# Patient Record
Sex: Female | Born: 1950 | Race: Black or African American | Hispanic: No | Marital: Married | Smoking: Former smoker
Health system: Southern US, Community
[De-identification: ages and names within clinical notes are randomized; demographics above are authoritative.]

## PROBLEM LIST (undated history)

## (undated) DIAGNOSIS — I5042 Chronic combined systolic (congestive) and diastolic (congestive) heart failure: Principal | ICD-10-CM

## (undated) DIAGNOSIS — I10 Essential (primary) hypertension: Secondary | ICD-10-CM

## (undated) DIAGNOSIS — I517 Cardiomegaly: Secondary | ICD-10-CM

## (undated) DIAGNOSIS — M1990 Unspecified osteoarthritis, unspecified site: Secondary | ICD-10-CM

## (undated) DIAGNOSIS — H269 Unspecified cataract: Secondary | ICD-10-CM

## (undated) DIAGNOSIS — N189 Chronic kidney disease, unspecified: Secondary | ICD-10-CM

## (undated) DIAGNOSIS — D649 Anemia, unspecified: Secondary | ICD-10-CM

## (undated) DIAGNOSIS — E785 Hyperlipidemia, unspecified: Secondary | ICD-10-CM

## (undated) DIAGNOSIS — H409 Unspecified glaucoma: Secondary | ICD-10-CM

## (undated) DIAGNOSIS — R600 Localized edema: Secondary | ICD-10-CM

## (undated) HISTORY — DX: Localized edema: R60.0

## (undated) HISTORY — DX: Cardiomegaly: I51.7

## (undated) HISTORY — DX: Chronic combined systolic (congestive) and diastolic (congestive) heart failure: I50.42

## (undated) HISTORY — DX: Essential (primary) hypertension: I10

## (undated) HISTORY — DX: Unspecified glaucoma: H40.9

## (undated) HISTORY — PX: CHOLECYSTECTOMY: SHX55

## (undated) HISTORY — DX: Unspecified cataract: H26.9

## (undated) HISTORY — DX: Anemia, unspecified: D64.9

## (undated) HISTORY — DX: Hyperlipidemia, unspecified: E78.5

---

## 1997-09-29 ENCOUNTER — Encounter: Admission: RE | Admit: 1997-09-29 | Discharge: 1997-12-28 | Payer: Self-pay | Admitting: Internal Medicine

## 1998-06-27 ENCOUNTER — Encounter: Payer: Self-pay | Admitting: Emergency Medicine

## 1998-06-27 ENCOUNTER — Emergency Department (HOSPITAL_COMMUNITY): Admission: EM | Admit: 1998-06-27 | Discharge: 1998-06-27 | Payer: Self-pay | Admitting: Emergency Medicine

## 1998-08-04 ENCOUNTER — Encounter: Admission: RE | Admit: 1998-08-04 | Discharge: 1998-08-29 | Payer: Self-pay | Admitting: Specialist

## 2002-10-26 ENCOUNTER — Encounter: Payer: Self-pay | Admitting: Family Medicine

## 2002-10-26 ENCOUNTER — Ambulatory Visit (HOSPITAL_COMMUNITY): Admission: RE | Admit: 2002-10-26 | Discharge: 2002-10-26 | Payer: Self-pay | Admitting: Family Medicine

## 2008-04-13 ENCOUNTER — Ambulatory Visit: Payer: Self-pay | Admitting: Cardiology

## 2010-04-08 ENCOUNTER — Encounter: Payer: Self-pay | Admitting: Family Medicine

## 2010-04-09 ENCOUNTER — Encounter: Payer: Self-pay | Admitting: Family Medicine

## 2010-08-01 NOTE — Assessment & Plan Note (Signed)
[facility]                            CARDIOLOGY OFFICE NOTE   NAME:[name], [name]                     MRN:          [id number]  DATE:04/13/2008                            DOB:          1950/07/30    PRIMARY CARE PHYSICIAN:  Meredith Staggers at [facility].   The patient is also seen by Jeralyn Ruths who is a Publishing rights manager  there at [facility].   HISTORY OF PRESENT ILLNESS:  This is a 60 year old with history of  diabetes, hypertension, and hyperlipidemia who presents to Cardiology  Clinic for evaluation of her coronary artery disease risk factors and  her poorly controlled hypertension.  The patient had actually gone about  3-4 years without seeing a doctor.  She had an appointment at [facility] back in December 2009.  She was found to have quite high  blood pressure with her systolics running above 200.  She was also noted  to have a hemoglobin A1c of 14.2.  She was started on Zocor, aspirin,  lisinopril/hydrochlorothiazide as well as metformin and glipizide at  [facility] and she was referred here for evaluation of her  cardiac risk factors and for help with control of her blood pressure.  The patient's blood pressure today is 193/111.  She says she has been  taking all her medicines.  She actually is fairly asymptomatic.  She has  never had any episodes of chest pain or discomfort.  She does not get  dyspnea on exertion.  She says she walks every where she needs to go  without being short of breath.  She can climb 1 or 2 flights of stairs  without any difficulty.  She has no orthopnea, no PND.  She denies any  headaches or visual changes and she has had no stroke-like symptoms.  She says she has a treadmill that she had been using in the past for  exercise; however, she has not been using it lately.   PAST MEDICAL HISTORY:  1. Type 2 diabetes.  Her hemoglobin A1c was 14.2 when she came for      initial  medical care back in December 2009.  She is now on      metformin and glipizide.  2. Hypertension.  The patient says that she has had high blood      pressure for several years; however, she was not on any medicine      prior to December 2009.  3. Hyperlipidemia.   SOCIAL HISTORY:  The patient is married.  She has a daughter.  She  drives a school bus.  She does not smoke or drink alcohol.   FAMILY HISTORY:  There is no family history of early coronary artery  disease.   REVIEW OF SYSTEMS:  Negative except as noted in the history present  illness.  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  Review of systems is negative  except as noted in the history present illness.   CURRENT MEDICATIONS:  1. Metformin 500 mg b.i.d.  2. Glipizide-XL 10 mg daily.  3. Zocor 40 mg daily.  4. Aspirin 81 mg daily.  5. Lisinopril/hydrochlorothiazide 20/12.5 daily.   LABORATORY DATA:  Sodium 139, creatinine 0.98, hemoglobin A1c in  December was 14.2, LDL 131, triglycerides 88, HDL 52.   PHYSICAL EXAMINATION:  VITAL SIGNS:  Blood pressure is 193/111, heart  rate is 100 beats a minute.  GENERAL:  This is a well-developed female in no apparent distress.  NEUROLOGIC:  Alert and oriented x3.  Normal affect.  LUNGS:  Clear to auscultation bilaterally with normal respiratory  effort.  CARDIOVASCULAR:  Heart regular.  S1 and S2.  There is an S4.  There is  no S3.  There is no murmur.  There is no peripheral edema.  There are 2+  posterior tibial pulses bilaterally.  There is no carotid bruit.  There  is no peripheral edema.  NECK:  There is no JVD.  There is no thyromegaly or thyroid nodule.  HEENT:  Normal exam.  ABDOMEN:  Soft, nontender.  No hepatosplenomegaly.  Normal bowel sounds.  LUNGS:  Clear to auscultation bilaterally with normal respiratory  effort.  MUSCULOSKELETAL:  Normal exam.  EXTREMITIES:  No clubbing or cyanosis.  SKIN:   Normal exam.   ASSESSMENT/PLAN:  This is a 60 year old with multiple cardiac risk  factors including diabetes, hypertension, and hyperlipidemia here for  evaluation of her coronary disease risk.  1. Coronary artery disease risk.  The patient does have a number of      risk factors for coronary disease.  She does have poorly controlled      diabetes and hypertension.  She also has high cholesterol.  She      actually has no ischemic symptoms at all.  She has never had      episodes of chest pain and actually she does seem to have good      exercise tolerance.  I think at this time the best course will be      to aggressively control her risk factors and to get them under much      better control.  She needs to be on aspirin 81 mg daily.  We need      to titrate her statin to a goal LDL of less than 70 and we need to      get her blood pressure down to below 130/80.  2. Hypertension.  The patient's blood pressure is way too high.  I did      tell her to go ahead and take an extra dose of      lisinopril/hydrochlorothiazide today and we will increase her      lisinopril/hydrochlorothiazide to 40/25.  We will additionally add      amlodipine 5 mg daily.  I will bring her back in 2 weeks for a Chem-      7 and blood pressure check to see how we are doing.  If needed, we      will titrate up amlodipine at that point.  We will additionally get      an echocardiogram to assess for her LV function and for LVH in the      setting of her hypertension.  3. Diabetes.  The patient needs better control of this.  She is      working on this through her primary care doctor.  4. Hyperlipidemia.  The patient is on Zocor 40 mg a day.  She was just  started on this back in December, our goal for her LDL less than      70.  5. We did talk at length about diet and exercise.  I told her she      needs to avoid sodium.  She needs to follow a Mediterranean-type      diet and I did encourage her to get a  treadmill back out and walk      for about half an hour a day 4-5 times a week on her treadmill.   I will see her back in followup in a month to assess her blood pressure  control and to go over her study.     Marca Ancona, MD  Electronically Signed    DM/MedQ  DD: 04/13/2008  DT: 04/14/2008  Job #: [id number]   cc:   Shade Flood, M.D.  Jeralyn Ruths

## 2011-03-27 ENCOUNTER — Ambulatory Visit (INDEPENDENT_AMBULATORY_CARE_PROVIDER_SITE_OTHER): Payer: 59 | Admitting: Physician Assistant

## 2011-03-27 DIAGNOSIS — D649 Anemia, unspecified: Secondary | ICD-10-CM

## 2011-03-27 DIAGNOSIS — IMO0001 Reserved for inherently not codable concepts without codable children: Secondary | ICD-10-CM

## 2011-03-27 DIAGNOSIS — I10 Essential (primary) hypertension: Secondary | ICD-10-CM

## 2011-04-15 ENCOUNTER — Ambulatory Visit (INDEPENDENT_AMBULATORY_CARE_PROVIDER_SITE_OTHER): Payer: 59

## 2011-04-15 DIAGNOSIS — R112 Nausea with vomiting, unspecified: Secondary | ICD-10-CM

## 2011-04-15 DIAGNOSIS — R55 Syncope and collapse: Secondary | ICD-10-CM

## 2011-04-15 DIAGNOSIS — E119 Type 2 diabetes mellitus without complications: Secondary | ICD-10-CM

## 2011-04-18 ENCOUNTER — Emergency Department (HOSPITAL_COMMUNITY): Payer: 59

## 2011-04-18 ENCOUNTER — Other Ambulatory Visit: Payer: Self-pay

## 2011-04-18 ENCOUNTER — Inpatient Hospital Stay (HOSPITAL_COMMUNITY)
Admission: EM | Admit: 2011-04-18 | Discharge: 2011-04-20 | DRG: 392 | Disposition: A | Discharge status: Home / Self Care | Payer: 59 | Attending: Internal Medicine | Admitting: Internal Medicine

## 2011-04-18 ENCOUNTER — Encounter (HOSPITAL_COMMUNITY): Payer: Self-pay

## 2011-04-18 ENCOUNTER — Ambulatory Visit (INDEPENDENT_AMBULATORY_CARE_PROVIDER_SITE_OTHER): Payer: 59 | Admitting: Family Medicine

## 2011-04-18 VITALS — BP 114/77 | HR 105 | Temp 98.4°F | Resp 20 | Ht 61.5 in | Wt 116.0 lb

## 2011-04-18 DIAGNOSIS — E119 Type 2 diabetes mellitus without complications: Secondary | ICD-10-CM

## 2011-04-18 DIAGNOSIS — I4510 Unspecified right bundle-branch block: Secondary | ICD-10-CM | POA: Insufficient documentation

## 2011-04-18 DIAGNOSIS — E860 Dehydration: Secondary | ICD-10-CM

## 2011-04-18 DIAGNOSIS — N289 Disorder of kidney and ureter, unspecified: Secondary | ICD-10-CM

## 2011-04-18 DIAGNOSIS — R531 Weakness: Secondary | ICD-10-CM

## 2011-04-18 DIAGNOSIS — I517 Cardiomegaly: Secondary | ICD-10-CM | POA: Insufficient documentation

## 2011-04-18 DIAGNOSIS — I447 Left bundle-branch block, unspecified: Secondary | ICD-10-CM

## 2011-04-18 DIAGNOSIS — D649 Anemia, unspecified: Secondary | ICD-10-CM | POA: Insufficient documentation

## 2011-04-18 DIAGNOSIS — I10 Essential (primary) hypertension: Secondary | ICD-10-CM

## 2011-04-18 DIAGNOSIS — N179 Acute kidney failure, unspecified: Secondary | ICD-10-CM

## 2011-04-18 DIAGNOSIS — E785 Hyperlipidemia, unspecified: Secondary | ICD-10-CM

## 2011-04-18 DIAGNOSIS — R5381 Other malaise: Secondary | ICD-10-CM | POA: Diagnosis present

## 2011-04-18 DIAGNOSIS — A088 Other specified intestinal infections: Principal | ICD-10-CM | POA: Diagnosis present

## 2011-04-18 DIAGNOSIS — R5383 Other fatigue: Secondary | ICD-10-CM | POA: Diagnosis present

## 2011-04-18 LAB — POCT CBC
Granulocyte percent: 43.2 %G (ref 37–80)
HCT, POC: 42.4 % (ref 37.7–47.9)
Hemoglobin: 13.8 g/dL (ref 12.2–16.2)
Lymph, poc: 2.6 (ref 0.6–3.4)
MCH, POC: 27.5 pg (ref 27–31.2)
MCHC: 32.5 g/dL (ref 31.8–35.4)
MCV: 84.4 fL (ref 80–97)
MID (cbc): 0.5 (ref 0–0.9)
MPV: 9 fL (ref 0–99.8)
POC Granulocyte: 2.3 (ref 2–6.9)
POC LYMPH PERCENT: 47.7 %L (ref 10–50)
POC MID %: 9.1 %M (ref 0–12)
Platelet Count, POC: 342 10*3/uL (ref 142–424)
RBC: 5.02 M/uL (ref 4.04–5.48)
RDW, POC: 13.9 %
WBC: 5.4

## 2011-04-18 LAB — DIFFERENTIAL
Basophils Absolute: 0 10*3/uL (ref 0.0–0.1)
Basophils Relative: 0 % (ref 0–1)
Eosinophils Relative: 1 % (ref 0–5)
Lymphocytes Relative: 46 % (ref 12–46)
Neutro Abs: 2.4 10*3/uL (ref 1.7–7.7)

## 2011-04-18 LAB — URINALYSIS, ROUTINE W REFLEX MICROSCOPIC
Bilirubin Urine: NEGATIVE
Glucose, UA: NEGATIVE mg/dL
Hgb urine dipstick: NEGATIVE
Nitrite: NEGATIVE
Specific Gravity, Urine: 1.015 (ref 1.005–1.030)
pH: 5 (ref 5.0–8.0)

## 2011-04-18 LAB — CBC
MCHC: 35.5 g/dL (ref 30.0–36.0)
Platelets: 295 10*3/uL (ref 150–400)
RDW: 12.4 % (ref 11.5–15.5)
WBC: 5.8 10*3/uL (ref 4.0–10.5)

## 2011-04-18 LAB — URINE MICROSCOPIC-ADD ON

## 2011-04-18 LAB — GLUCOSE, CAPILLARY: Glucose-Capillary: 93 mg/dL (ref 70–99)

## 2011-04-18 LAB — HEPATIC FUNCTION PANEL
ALT: 11 U/L (ref 0–35)
Total Protein: 6.6 g/dL (ref 6.0–8.3)

## 2011-04-18 LAB — BASIC METABOLIC PANEL
BUN: 46 mg/dL — ABNORMAL HIGH (ref 6–23)
Chloride: 91 mEq/L — ABNORMAL LOW (ref 96–112)
GFR calc Af Amer: 23 mL/min — ABNORMAL LOW (ref >90)
Potassium: 3.8 mEq/L (ref 3.5–5.1)

## 2011-04-18 MED ORDER — SODIUM CHLORIDE 0.9 % IV BOLUS (SEPSIS)
500.0000 mL | Freq: Once | INTRAVENOUS | Status: AC
  Administered 2011-04-18: 500 mL via INTRAVENOUS

## 2011-04-18 MED ORDER — ONDANSETRON HCL 4 MG PO TABS: 4.0000 mg | ORAL_TABLET | Freq: Four times a day (QID) | ORAL | Status: DC | PRN

## 2011-04-18 MED ORDER — ALUM & MAG HYDROXIDE-SIMETH 200-200-20 MG/5ML PO SUSP: 30.0000 mL | Freq: Four times a day (QID) | ORAL | Status: DC | PRN

## 2011-04-18 MED ORDER — ENOXAPARIN SODIUM 40 MG/0.4ML ~~LOC~~ SOLN: 40.0000 mg | SUBCUTANEOUS | Status: DC

## 2011-04-18 MED ORDER — INSULIN ASPART 100 UNIT/ML ~~LOC~~ SOLN: 0.0000 [IU] | Freq: Three times a day (TID) | SUBCUTANEOUS | Status: DC

## 2011-04-18 MED ORDER — SODIUM CHLORIDE 0.9 % IV SOLN
Freq: Once | INTRAVENOUS | Status: AC
  Administered 2011-04-18: 20:00:00 via INTRAVENOUS

## 2011-04-18 MED ORDER — GLIMEPIRIDE 4 MG PO TABS
4.0000 mg | ORAL_TABLET | Freq: Two times a day (BID) | ORAL | Status: DC
  Administered 2011-04-18 – 2011-04-20 (×4): 4 mg via ORAL
  Filled 2011-04-18 (×5): qty 1

## 2011-04-18 MED ORDER — CLONIDINE HCL 0.1 MG PO TABS
0.1000 mg | ORAL_TABLET | Freq: Two times a day (BID) | ORAL | Status: DC
  Administered 2011-04-18 – 2011-04-20 (×4): 0.1 mg via ORAL
  Filled 2011-04-18 (×5): qty 1

## 2011-04-18 MED ORDER — SODIUM CHLORIDE 0.9 % IV SOLN
INTRAVENOUS | Status: DC
  Administered 2011-04-19: 100 mL/h via INTRAVENOUS

## 2011-04-18 MED ORDER — ONDANSETRON HCL 4 MG/2ML IJ SOLN: 4.0000 mg | Freq: Four times a day (QID) | INTRAMUSCULAR | Status: DC | PRN

## 2011-04-18 MED ORDER — AMLODIPINE BESYLATE 10 MG PO TABS
10.0000 mg | ORAL_TABLET | Freq: Every day | ORAL | Status: DC
  Administered 2011-04-18 – 2011-04-20 (×3): 10 mg via ORAL
  Filled 2011-04-18 (×3): qty 1

## 2011-04-18 MED ORDER — INSULIN ASPART 100 UNIT/ML ~~LOC~~ SOLN: 0.0000 [IU] | Freq: Every day | SUBCUTANEOUS | Status: DC

## 2011-04-18 MED ORDER — ENOXAPARIN SODIUM 30 MG/0.3ML ~~LOC~~ SOLN
30.0000 mg | Freq: Every day | SUBCUTANEOUS | Status: DC
  Administered 2011-04-18 – 2011-04-19 (×2): 30 mg via SUBCUTANEOUS
  Filled 2011-04-18 (×3): qty 0.3

## 2011-04-18 NOTE — H&P (Addendum)
PCP:   Corey Harold, MD, MD   Chief Complaint:  Weakness  HPI: This is a 61 year old female who's had been having nausea, vomiting and diarrhea since last Tuesday. She's had poor by mouth intake as result. She reports she's had no fevers, no chills, no abdominal pain. She reports no lightheadedness. She has however become increasingly weaker. She has not been to see her PCP within the interim. Her diarrhea has resolved since yesterday. Her nausea had improved but today became worse. She has not able to walk without assistance since yesterday, the family brought her to the er. Last Friday she took a laxative, the next morning she was weak and syncopized. She reports no associated chest pains or palpitations. Today the patient went to the urgent care clinic her creatinine was elevated. They transferred her to [facility], she brought medical records it shows a recent creatinine of 1.07. History provided by the patient is with both family members are at bedside  Review of Systems: Positives bolded anorexia, fever, weight loss,, vision loss, decreased hearing, hoarseness, chest pain, syncope, dyspnea on exertion, peripheral edema, balance deficits, hemoptysis, abdominal pain, melena, hematochezia, severe indigestion/heartburn, hematuria, incontinence, genital sores, muscle weakness, suspicious skin lesions, transient blindness, difficulty walking, depression, unusual weight change, abnormal bleeding, enlarged lymph nodes, angioedema, and breast masses.  Past Medical History: Past Medical History  Diagnosis Date  . Hypertension   . Diabetes mellitus   . Anemia   . Hyperlipidemia   . Left ventricular hypertrophy    Past Surgical History  Procedure Date  . Cholecystectomy     Medications: Prior to Admission medications   Medication Sig Start Date End Date Taking? Authorizing Provider  amLODipine (NORVASC) 10 MG tablet Take 10 mg by mouth daily.   Yes Historical Provider, MD  cloNIDine (CATAPRES)  0.1 MG tablet Take 0.1 mg by mouth 2 (two) times daily.   Yes Historical Provider, MD  glimepiride (AMARYL) 4 MG tablet Take 4 mg by mouth 2 (two) times daily.   Yes Historical Provider, MD  metFORMIN (GLUCOPHAGE) 1000 MG tablet Take 1,000 mg by mouth 2 (two) times daily with a meal.   Yes Historical Provider, MD  pravastatin (PRAVACHOL) 40 MG tablet Take 40 mg by mouth at bedtime.   Yes Historical Provider, MD    Allergies:   Allergies  Allergen Reactions  . Macrobid Nausea Only    Social History:  reports that she quit smoking about 45 years ago. Her smoking use included Cigarettes. She does not have any smokeless tobacco history on file. She reports that she does not drink alcohol or use illicit drugs.  Family History: Family History  Problem Relation Age of Onset  . Diabetes type II    . Hypertension      Physical Exam: Filed Vitals:   04/18/11 1626 04/18/11 1924 04/18/11 1935  BP: 153/97 154/64 152/78  Pulse: 88 95 91  Temp: 98.4 F (36.9 C) 99.4 F (37.4 C)   TempSrc: Oral Oral   Resp: 16 18 16   Height: 5\' 1"  (1.549 m)    Weight: 52.617 kg (116 lb)    SpO2: 99% 99% 99%    General:  Alert and oriented times three, slim but weak female, no acute distress Eyes: PERRLA, pink conjunctiva, no scleral icterus ENT: dry oral mucosa, neck supple, no thyromegaly Lungs: clear to ascultation, no wheeze, no crackles, no use of accessory muscles Cardiovascular: regular rate and rhythm, no regurgitation, no gallops, no murmurs. No carotid bruits, no JVD  Abdomen: soft, positive BS, non-tender, non-distended, no organomegaly, not an acute abdomen GU: not examined Neuro: CN II - XII grossly intact, sensation intact Musculoskeletal: strength 5/5 all extremities, no clubbing, cyanosis or edema Skin: no rash, no subcutaneous crepitation, no decubitus Psych: appropriate patient   Labs on Admission:   Basename 04/18/11 1800  NA 130*  K 3.8  CL 91*  CO2 26  GLUCOSE 119*  BUN  46*  CREATININE 2.46*  CALCIUM 9.4  MG --  PHOS --    Basename 04/18/11 1825  AST 15  ALT 11  ALKPHOS 70  BILITOT 0.3  PROT 6.6  ALBUMIN 3.1*   No results found for this basename: LIPASE:2,AMYLASE:2 in the last 72 hours  Basename 04/18/11 1800 04/18/11 1550  WBC 5.8 5.4  NEUTROABS 2.4 --  HGB 12.4 13.8  HCT 34.9* 42.4  MCV 79.0 84.4  PLT 295 --   No results found for this basename: CKTOTAL:3,CKMB:3,CKMBINDEX:3,TROPONINI:3 in the last 72 hours No components found with this basename: POCBNP:3 No results found for this basename: DDIMER:2 in the last 72 hours No results found for this basename: HGBA1C:2 in the last 72 hours No results found for this basename: CHOL:2,HDL:2,LDLCALC:2,TRIG:2,CHOLHDL:2,LDLDIRECT:2 in the last 72 hours No results found for this basename: TSH,T4TOTAL,FREET3,T3FREE,THYROIDAB in the last 72 hours No results found for this basename: VITAMINB12:2,FOLATE:2,FERRITIN:2,TIBC:2,IRON:2,RETICCTPCT:2 in the last 72 hours  Micro Results: No results found for this or any previous visit (from the past 240 hour(s)).   Radiological Exams on Admission: No results found.  Assessment/Plan Present on Admission:  .Acute kidney injury/dehydration  .Weakness generalized Gastroenteritis Admit to MedSurg Liquid diet advance as tolerated to 80 diagonal IV fluid hydration No overt evidence of infection therefore no antibiotics started  .Anemia .DM (diabetes mellitus) .HTN (hypertension) .Hyperlipidemia Stable, resume home medication Syncope Likely Due to orthostatic changes  Full code DVT prophylaxis Team 2/Dr. Sammuel Cooper, [place] Weatherwax 04/18/2011, 9:06 PM

## 2011-04-18 NOTE — ED Provider Notes (Signed)
History     CSN: [id number]  Arrival date & time 04/18/11  1619   First MD Initiated Contact with Patient 04/18/11 1716      Chief Complaint  Patient presents with  . Nausea  . Emesis  . Diarrhea    (Consider location/radiation/quality/duration/timing/severity/associated sxs/prior treatment) HPI Comments: Pt with history of diabetes, hypertension, and hyperlipidemia who presents to the ED from Urgent care for dehydration and ARF. Pt was sent with lab results from [facility] Jan 14th crt 1.07 & BUN of 18; Jan 28th crt 1.84 & BUN 46.  Patient has had symptoms of nausea, vomiting, diarrhea times one week.  She denies any hematemesis or hematochezia.  Patient reports that she did not have any abdominal pain.  Patient states she has felt febrile with night sweats and chills in that she has entire body fatigue but denies extremity weakness.  Patient denies having a cough, sore throat, congestion, chest pain, shortness of breath.     Patient is a 61 y.o. female presenting with vomiting and diarrhea. The history is provided by the patient.  Emesis  Associated symptoms include diarrhea. Pertinent negatives include no abdominal pain, no arthralgias, no chills, no cough, no fever, no headaches and no myalgias.  Diarrhea The primary symptoms include nausea, vomiting and diarrhea. Primary symptoms do not include fever, abdominal pain, dysuria, myalgias, arthralgias or rash.  The illness does not include chills or constipation.    Past Medical History  Diagnosis Date  . Hypertension   . Diabetes mellitus   . Anemia   . Hyperlipidemia   . Left ventricular hypertrophy     Past Surgical History  Procedure Date  . Cholecystectomy     History reviewed. No pertinent family history.  History  Substance Use Topics  . Smoking status: Former Smoker    Types: Cigarettes    Quit date: 03/19/1966  . Smokeless tobacco: Not on file  . Alcohol Use: Not on file    OB History    Grav Para  Term Preterm Abortions TAB SAB Ect Mult Living                  Review of Systems  Constitutional: Positive for appetite change. Negative for fever and chills.  HENT: Negative for neck stiffness and dental problem.   Eyes: Negative for visual disturbance.  Respiratory: Negative for cough, chest tightness, shortness of breath and wheezing.   Cardiovascular: Negative for chest pain.  Gastrointestinal: Positive for nausea, vomiting and diarrhea. Negative for abdominal pain, constipation, blood in stool, abdominal distention, anal bleeding and rectal pain.  Genitourinary: Negative for dysuria, urgency, hematuria and flank pain.  Musculoskeletal: Negative for myalgias and arthralgias.  Skin: Negative for rash.  Neurological: Negative for dizziness, syncope, speech difficulty, numbness and headaches.  Hematological: Does not bruise/bleed easily.  All other systems reviewed and are negative.    Allergies  Macrobid  Home Medications   Current Outpatient Rx  Name Route Sig Dispense Refill  . AMLODIPINE BESYLATE 10 MG PO TABS Oral Take 10 mg by mouth daily.    Marland Kitchen CLONIDINE HCL 0.1 MG PO TABS Oral Take 0.1 mg by mouth 2 (two) times daily.    Marland Kitchen GLIMEPIRIDE 4 MG PO TABS Oral Take 4 mg by mouth 2 (two) times daily.    Marland Kitchen METFORMIN HCL 1000 MG PO TABS Oral Take 1,000 mg by mouth 2 (two) times daily with a meal.    . PRAVASTATIN SODIUM 40 MG PO TABS Oral Take  40 mg by mouth at bedtime.      BP 152/78  Pulse 91  Temp(Src) 99.4 F (37.4 C) (Oral)  Resp 16  Ht 5\' 1"  (1.549 m)  Wt 116 lb (52.617 kg)  BMI 21.92 kg/m2  SpO2 99%  Physical Exam  Nursing note and vitals reviewed. Constitutional: Vital signs are normal. She appears well-developed and well-nourished. No distress.  HENT:  Head: Normocephalic and atraumatic.  Mouth/Throat: Uvula is midline, oropharynx is clear and moist and mucous membranes are normal.  Eyes: Conjunctivae and EOM are normal. Pupils are equal, round, and reactive  to light.  Neck: Normal range of motion and full passive range of motion without pain. Neck supple. No spinous process tenderness and no muscular tenderness present. No rigidity. No Brudzinski's sign noted.  Cardiovascular: Regular rhythm and intact distal pulses.        Tachycardic HR 108, weak pedal pulses  Pulmonary/Chest: Effort normal and breath sounds normal. No accessory muscle usage. Not tachypneic. No respiratory distress.  Abdominal: Soft. Normal appearance and bowel sounds are normal. She exhibits no distension, no ascites, no pulsatile midline mass and no mass. There is no tenderness. There is no CVA tenderness. No hernia.       No abdominal tenderness  Lymphadenopathy:    She has no cervical adenopathy.  Neurological: She is alert.  Skin: Skin is warm and dry. No rash noted. She is not diaphoretic.  Psychiatric: She has a normal mood and affect. Her speech is normal and behavior is normal.    ED Course  Procedures (including critical care time)  Labs Reviewed  GLUCOSE, CAPILLARY - Abnormal; Notable for the following:    Glucose-Capillary 112 (*)    All other components within normal limits  URINALYSIS, ROUTINE W REFLEX MICROSCOPIC - Abnormal; Notable for the following:    Ketones, ur TRACE (*)    Protein, ur 30 (*)    All other components within normal limits  BASIC METABOLIC PANEL - Abnormal; Notable for the following:    Sodium 130 (*)    Chloride 91 (*)    Glucose, Bld 119 (*)    BUN 46 (*)    Creatinine, Ser 2.46 (*)    GFR calc non Af Amer 20 (*)    GFR calc Af Amer 23 (*)    All other components within normal limits  CBC - Abnormal; Notable for the following:    HCT 34.9 (*)    All other components within normal limits  DIFFERENTIAL - Abnormal; Notable for the following:    Neutrophils Relative 42 (*)    All other components within normal limits  HEPATIC FUNCTION PANEL - Abnormal; Notable for the following:    Albumin 3.1 (*)    All other components within  normal limits  URINE MICROSCOPIC-ADD ON - Abnormal; Notable for the following:    Squamous Epithelial / LPF FEW (*)    Bacteria, UA FEW (*)    Casts GRANULAR CAST (*)    All other components within normal limits   No results found.   No diagnosis found.  BUN: Crt -19:1  MDM    Pt seen with Dr. Preston Fleeting who has called to get pt admitted for dehydration from N/V/D & ARF        Jaci Carrel, PA-C 04/20/11 1610

## 2011-04-18 NOTE — ED Provider Notes (Addendum)
61 year old female has had nausea and diarrhea for the last 8 days. She's had some intermittent vomiting. She's had severe anorexia during this time as well. She went to an urgent care Center where she was diagnosed as dehydrated and told to come to the emergency department. No one else at home has been sick. Family members state that she felt warm but she was not aware of any fever or chills or sweats. She's not having abdominal pain. On exam, her abdomen is soft and nontender but peristalsis is diminished. Workup has been initiated and she's been given IV fluids.  Dione Booze, MD 04/18/11 1825   Date: 04/18/2011  Rate: 93  Rhythm: normal sinus rhythm  QRS Axis: left  Intervals: normal  ST/T Wave abnormalities: normal  Conduction Disutrbances:right bundle branch block and left anterior fascicular block  Narrative Interpretation: Normal sinus rhythm with right bundle branch block and left anterior fascicular block. No old ECG available for comparison  Old EKG Reviewed: none available    Dione Booze, MD 04/18/11 1920  Laboratory workup has shown a renal insufficiency. Patient has lab work from urgent care were which has shown her creatinine has risen from 1.27 on January 14 2.8 for a January 27 22.46 today. BUN has risen from 18-46 and today is stable at 46 compared with 3 days ago. She has been given some IV hydration. Case is discussed with Dr. Joneen Roach of [facility] who agrees to admit the patient.  Dione Booze, MD 04/18/11 2003

## 2011-04-18 NOTE — ED Notes (Signed)
Pt brought by ems from urgent care center with c/o dehydration. Pt c/o N/V/D x 1.5 weeks, went to urgent care and sent here for eval.

## 2011-04-18 NOTE — Progress Notes (Signed)
  Subjective:    Patient ID: [name], female    DOB: [birth year], 61 y.o.   MRN: [id number]  HPI    Review of Systems     Objective:   Physical Exam    Procedure: 20G NS IV RAC - venipuncture off IV site.    Assessment & Plan:

## 2011-04-18 NOTE — Progress Notes (Signed)
  Subjective:    Patient ID: [name], female    DOB: Nov 26, 1950, 61 y.o.   MRN: [id number]  HPI 61 who came to the office 2 days ago with history of sickness since Tuesday a week ago. Apparently last week she had fallen once. Her and she developed weakness last Friday, having taken a laxative the night before. She then had vomiting and had not been eating well for several days. However when she came to the office on January 27 she was clinically dry but doing a little bit better. She was not actively vomiting at that time. She was nauseous still. We did treat her with Zofran and g home, to push fluids. Yesterday when her labs came back she had had a marked elevation of her BUN and creatinine from a few weeks earlier. We called her and she seemed to be doing okay, said that she was drinking. She was told to come in today to get rechecked and get her labs rechecked. However last night and this morning she has vomited some more. She's continued to not eat or drink well. She apparently has been unsteady of gait. She is diabetic, and checked her sugar at home which was in the 130s yesterday.  On January 14 her BUN was 18 and creatinine 1.07. On January 28 BUN 46 and creatinine 1.84.   Review of Systems    review of systems reveals a lady who is laying down but does not complaining of a lot specifically. She does have generalized weakness. No headache. Unsteady gait though not frankly dizzy. HEENT unremarkable. Cardiovascular no chest pain or palpitations. Respiratory no shortness of breath or cough GI nausea as noted above, with some vomiting. No diarrhea. Bowels have moved yesterday and again today. GU is unremarkable except for infrequent voiding. Objective:   Physical Exam Somewhat pale-appearing elderly female laying bundled up in a blanket.Ears normal. Eyes slightly sunken in. Tongue dry. Neck supple. No JVD. Chest clear to auscultation. Heart regular. Abdomen soft without organomegaly masses or  tenderness. Extremities without edema. Skin turgor poor.  Vital signs which were taken by the nurse showed only a mild elevation of pulse but blood pressure could not be obtained when she was standing. This was tried both manually and with the electronic meter. When I felt her pulse radially as she went from seated to standing, the pulse became very weak.       Assessment & Plan:  Moderately severe dehydration. Diabetes mellitus Hypertension with orthostatic hypotension/ Gait disturbance secondary to hypotension and dehydration History of anemia History hyperlipidemia History left ventricular hypertrophy History right bundle branch block  Initially we were going to begin an IV and hydrate her here in the office. However when it appeared that her blood pressure was dropping so much when she stood up, we decided to go ahead and send her to the emergency room because she needed to have labs checked along with the hydration. She will need to be Until she is well hydrated and safe.

## 2011-04-18 NOTE — ED Notes (Signed)
Pt states she is feeling much better.

## 2011-04-18 NOTE — ED Notes (Signed)
[id number]<AV> Expected date:04/18/11<BR> Expected time: 4:14 PM<BR> Means of arrival:Ambulance<BR> Comments:<BR> EMS 110 GC - dehydration

## 2011-04-18 NOTE — ED Notes (Signed)
Dr. Joneen Roach MD at bedside.

## 2011-04-18 NOTE — Patient Instructions (Addendum)
Patient instructed that she will be sent to the emergency room on IV fluids by EMS  She should return to the office sometime within one to 2 weeks after discharge from the hospital to reestablish her status at that time.

## 2011-04-19 LAB — CBC
HCT: 29.8 % — ABNORMAL LOW (ref 36.0–46.0)
MCHC: 36.6 g/dL — ABNORMAL HIGH (ref 30.0–36.0)
Platelets: 254 10*3/uL (ref 150–400)
RDW: 12.5 % (ref 11.5–15.5)
WBC: 5.1 10*3/uL (ref 4.0–10.5)

## 2011-04-19 LAB — BASIC METABOLIC PANEL
BUN: 37 mg/dL — ABNORMAL HIGH (ref 6–23)
Calcium: 8.6 mg/dL (ref 8.4–10.5)
Chloride: 98 mEq/L (ref 96–112)
Creatinine, Ser: 1.89 mg/dL — ABNORMAL HIGH (ref 0.50–1.10)
GFR calc Af Amer: 32 mL/min — ABNORMAL LOW (ref >90)
GFR calc non Af Amer: 27 mL/min — ABNORMAL LOW (ref >90)

## 2011-04-19 NOTE — Progress Notes (Signed)
CARE MANAGEMENT NOTE 04/19/2011  Patient:  [name], [name]   Account Number:  [id number]  Date Initiated:  04/19/2011  Documentation initiated by:  Demba Nigh  Subjective/Objective Assessment:   pt with hx of nausea,vomiting,htn,syncope     Action/Plan:   lives at home   Anticipated DC Date:  04/22/2011   Anticipated DC Plan:  HOME/SELF CARE  In-house referral  NA  NA      DC Planning Services  NA      PAC Choice  NA   Choice offered to / List presented to:  NA   DME arranged  NA      DME agency  NA     HH arranged  NA      HH agency  NA   Status of service:  In process, will continue to follow Medicare Important Message given?  NA - LOS <3 / Initial given by admissions (If response is "NO", the following Medicare IM given date fields will be blank) Date Medicare IM given:   Date Additional Medicare IM given:    Discharge Disposition:    Per UR Regulation:  Reviewed for med. necessity/level of care/duration of stay  Comments:  [id number]/Deaun Rocha,RN,BSN,CCM

## 2011-04-19 NOTE — Progress Notes (Signed)
Subjective: Feels much better today. No further vomiting or diarrhea.  Objective: Vital signs in last 24 hours: Temp:  [97.8 F (36.6 C)-99.4 F (37.4 C)] 97.8 F (36.6 C) (01/31 0634) Pulse Rate:  [79-108] 79  (01/31 0634) Resp:  [16-20] 16  (01/31 0634) BP: (98-154)/(64-102) 140/86 mmHg (01/31 0634) SpO2:  [96 %-100 %] 100 % (01/31 0634) Weight:  [52.617 kg (116 lb)-55.3 kg (121 lb 14.6 oz)] 55.3 kg (121 lb 14.6 oz) (01/31 0229) Weight change:  Last BM Date: 04/18/11  Intake/Output from previous day:       Physical Exam: General: Alert, awake, oriented x3, in no acute distress. HEENT: No bruits, no goiter. Heart: Regular rate and rhythm, without murmurs, rubs, gallops. Lungs: Clear to auscultation bilaterally. Abdomen: Soft, nontender, nondistended, positive bowel sounds. Extremities: No clubbing cyanosis or edema with positive pedal pulses. Neuro: Grossly intact, nonfocal.    Lab Results: Basic Metabolic Panel:  Basename 04/19/11 0348 04/18/11 1800  NA 131* 130*  K 3.4* 3.8  CL 98 91*  CO2 24 26  GLUCOSE 88 119*  BUN 37* 46*  CREATININE 1.89* 2.46*  CALCIUM 8.6 9.4  MG -- --  PHOS -- --   Liver Function Tests:  [facility] 04/18/11 1825  AST 15  ALT 11  ALKPHOS 70  BILITOT 0.3  PROT 6.6  ALBUMIN 3.1*   CBC:  Basename 04/19/11 0348 04/18/11 1800  WBC 5.1 5.8  NEUTROABS -- 2.4  HGB 10.9* 12.4  HCT 29.8* 34.9*  MCV 79.3 79.0  PLT 254 295   CBG:  Basename 04/19/11 0738 04/18/11 2221 04/18/11 1732  GLUCAP 76 93 112*   Urinalysis:  Basename 04/18/11 1804  COLORURINE YELLOW  LABSPEC 1.015  PHURINE 5.0  GLUCOSEU NEGATIVE  HGBUR NEGATIVE  BILIRUBINUR NEGATIVE  KETONESUR TRACE*  PROTEINUR 30*  UROBILINOGEN 0.2  NITRITE NEGATIVE  LEUKOCYTESUR NEGATIVE    Studies/Results: Dg Chest Port 1 View  04/18/2011  *RADIOLOGY REPORT*  Clinical Data: Cough, dehydration, weakness  PORTABLE CHEST - 1 VIEW  Comparison: None.  Findings: Shallow  inspiration.  Heart size and pulmonary vascularity are normal.  No focal airspace consolidation in the lungs.  No blunting of costophrenic angles.  No pneumothorax. Circumscribed ovoid density projected over the right upper lung demonstrates a lucent halo consistent with a skin lesion. Correlation with physical exam is recommended.  Degenerative changes in the spine.  IMPRESSION: No evidence of active pulmonary disease.  Original Report Authenticated By: Marlon Pel, M.D.    Medications: Scheduled Meds:   . sodium chloride   Intravenous Once  . amLODipine  10 mg Oral Daily  . cloNIDine  0.1 mg Oral BID  . enoxaparin (LOVENOX) injection  30 mg Subcutaneous QHS  . glimepiride  4 mg Oral BID  . insulin aspart  0-15 Units Subcutaneous TID WC  . insulin aspart  0-5 Units Subcutaneous QHS  . sodium chloride  500 mL Intravenous Once  . sodium chloride  500 mL Intravenous Once  . DISCONTD: enoxaparin  40 mg Subcutaneous Q24H   Continuous Infusions:   . sodium chloride 100 mL/hr at 04/18/11 2155   PRN Meds:.alum & mag hydroxide-simeth, ondansetron (ZOFRAN) IV, ondansetron  Assessment/Plan:  Principal Problem:  *Weakness generalized Active Problems:  DM (diabetes mellitus)  HTN (hypertension)  Anemia  Hyperlipidemia  Acute kidney injury   #1 nausea, vomiting, diarrhea: Resolved. Likely secondary to self-limiting viral gastroenteritis.  #2 acute renal failure: Likely secondary to prerenal azotemia secondary to dehydration from GI  losses. Improving. Continue IV fluids.  #3 generalized weakness: Suspect secondary to marked dehydration. Will get PT and OT to evaluate.  #4 disposition: Likely discharge home in the morning as long as renal function continues to improve.   LOS: 1 day   [facility] Pager: [phone] 04/19/2011, 9:24 AM

## 2011-04-19 NOTE — Progress Notes (Signed)
PT Cancellation Note  Treatment cancelled today due to pt has been up walking on her own.  Pt states she does not need PT.  [name] 04/19/2011, 2:51 PM

## 2011-04-20 LAB — GLUCOSE, CAPILLARY

## 2011-04-20 LAB — BASIC METABOLIC PANEL
CO2: 26 mEq/L (ref 19–32)
Calcium: 8.7 mg/dL (ref 8.4–10.5)
Glucose, Bld: 60 mg/dL — ABNORMAL LOW (ref 70–99)
Sodium: 136 mEq/L (ref 135–145)

## 2011-04-20 NOTE — Progress Notes (Signed)
Occupational Therapy Note Pt states she has been getting up on her own in the room and doing her ADL without difficulty. She declines need for OT services at this time. Will sign off per her request. Judithann Sauger OTR/L [phone] 04/20/2011

## 2011-04-20 NOTE — ED Provider Notes (Signed)
Medical screening examination/treatment/procedure(s) were conducted as a shared visit with non-physician practitioner(s) and myself.  I personally evaluated the patient during the encounter   Dione Booze, MD 04/20/11 1501

## 2011-04-20 NOTE — Discharge Summary (Signed)
Physician Discharge Summary  Patient ID: [name] MRN: [id number] DOB/AGE: May 03, 1950 61 y.o.  Admit date: 04/18/2011 Discharge date: 04/20/2011  Primary Care Physician:  Corey Harold, MD, MD   Discharge Diagnoses:    Principal Problem:  *Weakness generalized Active Problems:  DM (diabetes mellitus)  HTN (hypertension)  Anemia  Hyperlipidemia  Acute renal failure Viral gastroenteritis    Medication List  As of 04/20/2011 10:44 AM   TAKE these medications         amLODipine 10 MG tablet   Commonly known as: NORVASC   Take 10 mg by mouth daily.      cloNIDine 0.1 MG tablet   Commonly known as: CATAPRES   Take 0.1 mg by mouth 2 (two) times daily.      glimepiride 4 MG tablet   Commonly known as: AMARYL   Take 4 mg by mouth 2 (two) times daily.      metFORMIN 1000 MG tablet   Commonly known as: GLUCOPHAGE   Take 1,000 mg by mouth 2 (two) times daily with a meal.      pravastatin 40 MG tablet   Commonly known as: PRAVACHOL   Take 40 mg by mouth at bedtime.             Disposition and Follow-up:  Patient will be discharged home today in stable and improved condition. She will to followup with her PCP in 10-14 days for a BMET to follow on her renal function  Consults:  None    Significant Diagnostic Studies:  Dg Chest [facility] 1 View  04/18/2011  *RADIOLOGY REPORT*  Clinical Data: Cough, dehydration, weakness  PORTABLE CHEST - 1 VIEW  Comparison: None.  Findings: Shallow inspiration.  Heart size and pulmonary vascularity are normal.  No focal airspace consolidation in the lungs.  No blunting of costophrenic angles.  No pneumothorax. Circumscribed ovoid density projected over the right upper lung demonstrates a lucent halo consistent with a skin lesion. Correlation with physical exam is recommended.  Degenerative changes in the spine.  IMPRESSION: No evidence of active pulmonary disease.  Original Report Authenticated By: Marlon Pel, M.D.    Brief H and  P: For complete details please refer to admission H and P, but in brief this is a 61 year old female who's had been having nausea, vomiting and diarrhea since last Tuesday. She's had poor by mouth intake as result. She reports she's had no fevers, no chills, no abdominal pain. She reports no lightheadedness. She has however become increasingly weaker. Was found to be in acute renal failure and because of this was admitted to our service for further evaluation and management.     Hospital Course:  Principal Problem:  *Weakness generalized Active Problems:  DM (diabetes mellitus)  HTN (hypertension)  Anemia  Hyperlipidemia Acute renal failure Viral gastroenteritis   #1 viral gastroenteritis: Self-limiting resolved on day #1 of hospitalization.  #2 acute renal failure: Suspect this is secondary to prerenal azotemia related to GI losses and very poor by mouth intake. Her renal function today is down to 1.3. Her baseline appears to be 1.07. Her creatinine has decreased from 2.46 on admission.  #3 generalized weakness: This has completely resolved. Suspect this is related to her dehydration and renal failure.  Time spent on Discharge: Greater than 30 minutes.  SignedChaya Jan [facility] Pager: [phone] 04/20/2011, 10:44 AM

## 2011-04-24 ENCOUNTER — Encounter: Payer: Self-pay | Admitting: Physician Assistant

## 2011-04-24 ENCOUNTER — Ambulatory Visit (INDEPENDENT_AMBULATORY_CARE_PROVIDER_SITE_OTHER): Payer: 59 | Admitting: Physician Assistant

## 2011-04-24 VITALS — BP 122/74 | HR 74 | Temp 97.1°F | Resp 16 | Ht 61.75 in | Wt 124.0 lb

## 2011-04-24 DIAGNOSIS — D649 Anemia, unspecified: Secondary | ICD-10-CM

## 2011-04-24 DIAGNOSIS — N189 Chronic kidney disease, unspecified: Secondary | ICD-10-CM

## 2011-04-24 DIAGNOSIS — E1165 Type 2 diabetes mellitus with hyperglycemia: Secondary | ICD-10-CM

## 2011-04-24 DIAGNOSIS — E119 Type 2 diabetes mellitus without complications: Secondary | ICD-10-CM

## 2011-04-24 DIAGNOSIS — I10 Essential (primary) hypertension: Secondary | ICD-10-CM

## 2011-04-24 LAB — COMPREHENSIVE METABOLIC PANEL
ALT: 12 U/L (ref 0–35)
AST: 14 U/L (ref 0–37)
Albumin: 3.3 g/dL — ABNORMAL LOW (ref 3.5–5.2)
BUN: 13 mg/dL (ref 6–23)
Calcium: 9.1 mg/dL (ref 8.4–10.5)
Chloride: 101 mEq/L (ref 96–112)
Potassium: 4.1 mEq/L (ref 3.5–5.3)
Total Protein: 5.5 g/dL — ABNORMAL LOW (ref 6.0–8.3)

## 2011-04-24 NOTE — Progress Notes (Signed)
  Subjective:    Patient ID: [name], female    DOB: 06/08/1950, 61 y.o.   MRN: [id number]  HPI [name] comes in today for post hospital f/u for dehydrations, ARF.  She was seen here 04/15/11 for n/v, dizziness and syncope.  She returned on 1/30 with worsening sx, was sent to ER, and then admitted. During her admission, she had ARF (BUN 46/ CR 2.46). Upon discharge, BUN/CR normalized. She feels good today but remains tired.She says she is on all her originally prescribed meds which includes Lisinopril.  Her discharge summary does not have her on Lisinopril.  [name] admits she is unclear about what she is taking.   She would like to have her return to work note extended.     Review of Systems  Constitutional: Positive for fatigue.  Respiratory: Negative for chest tightness.   Cardiovascular: Negative for chest pain and palpitations.  Gastrointestinal: Negative for nausea and vomiting.  Neurological: Negative for dizziness, light-headedness and headaches.        Objective:   Physical Exam  Constitutional: She is oriented to person, place, and time. No distress.       [name] appears tired today but in good spirits.    Eyes: Conjunctivae are normal.  Cardiovascular: Normal rate, regular rhythm and normal heart sounds.   Pulmonary/Chest: Effort normal.  Musculoskeletal: She exhibits no edema.  Neurological: She is alert and oriented to person, place, and time.  Skin: She is not diaphoretic.   Results for orders placed in visit on 04/24/11  GLUCOSE, POCT (MANUAL RESULT ENTRY)      Component Value Range   POC Glucose 173    POCT GLYCOSYLATED HEMOGLOBIN (HGB A1C)      Component Value Range   Hemoglobin A1C 9.1            Assessment & Plan:   1. Renal failure (ARF), acute on chronic  Comprehensive metabolic panel, POCT glucose (manual entry), POCT glycosylated hemoglobin (Hb A1C), Orthostatic vital signs  2. HTN (hypertension)  Comprehensive metabolic panel    3. DM (diabetes mellitus), type 2, uncontrolled with complications  Comprehensive metabolic panel, POCT glucose (manual entry), POCT glycosylated hemoglobin (Hb A1C)  4. Anemia  CBC with Differential    Hold Lisinopril for now until she returns. Continue meds otherwise. Check BS qd. Push fluids OOW until 05/21/11 Will pass FMLA papers to [employer] for completion.

## 2011-04-24 NOTE — Patient Instructions (Signed)
Hold the Lisinopril until I see you at the end of February

## 2011-04-25 LAB — CBC WITH DIFFERENTIAL/PLATELET
Basophils Absolute: 0 10*3/uL (ref 0.0–0.1)
Basophils Relative: 0 % (ref 0–1)
Eosinophils Absolute: 0.1 10*3/uL (ref 0.0–0.7)
Eosinophils Relative: 2 % (ref 0–5)
MCH: 27.6 pg (ref 26.0–34.0)
MCHC: 32.9 g/dL (ref 30.0–36.0)
MCV: 83.8 fL (ref 78.0–100.0)
Platelets: 264 10*3/uL (ref 150–400)
RDW: 13.2 % (ref 11.5–15.5)
WBC: 5.4 10*3/uL (ref 4.0–10.5)

## 2011-05-08 ENCOUNTER — Ambulatory Visit (INDEPENDENT_AMBULATORY_CARE_PROVIDER_SITE_OTHER): Payer: 59 | Admitting: Physician Assistant

## 2011-05-08 ENCOUNTER — Encounter: Payer: Self-pay | Admitting: Physician Assistant

## 2011-05-08 DIAGNOSIS — N219 Calculus of lower urinary tract, unspecified: Secondary | ICD-10-CM

## 2011-05-08 DIAGNOSIS — D649 Anemia, unspecified: Secondary | ICD-10-CM

## 2011-05-08 DIAGNOSIS — E118 Type 2 diabetes mellitus with unspecified complications: Secondary | ICD-10-CM

## 2011-05-08 DIAGNOSIS — N179 Acute kidney failure, unspecified: Secondary | ICD-10-CM

## 2011-05-08 DIAGNOSIS — I10 Essential (primary) hypertension: Secondary | ICD-10-CM

## 2011-05-08 LAB — BASIC METABOLIC PANEL
BUN: 18 mg/dL (ref 6–23)
Calcium: 9.4 mg/dL (ref 8.4–10.5)
Potassium: 4.4 mEq/L (ref 3.5–5.3)
Sodium: 138 mEq/L (ref 135–145)

## 2011-05-08 LAB — CBC WITH DIFFERENTIAL/PLATELET
Basophils Absolute: 0 10*3/uL (ref 0.0–0.1)
Basophils Relative: 0 % (ref 0–1)
Eosinophils Absolute: 0.1 10*3/uL (ref 0.0–0.7)
MCH: 28.3 pg (ref 26.0–34.0)
MCHC: 34.1 g/dL (ref 30.0–36.0)
Monocytes Absolute: 0.4 10*3/uL (ref 0.1–1.0)
Neutro Abs: 1.6 10*3/uL — ABNORMAL LOW (ref 1.7–7.7)
Neutrophils Relative %: 40 % — ABNORMAL LOW (ref 43–77)
RDW: 13.5 % (ref 11.5–15.5)

## 2011-05-08 LAB — GLUCOSE, POCT (MANUAL RESULT ENTRY): POC Glucose: 185

## 2011-05-08 NOTE — Progress Notes (Signed)
  Subjective:    Patient ID: [name], female    DOB: 18-Aug-1950, 61 y.o.   MRN: [id number]  HPI [name] is here for follow up for multiple issues.    She has been off Lisinopril since last ov and BP is well controlled.  She is feeling good overall. She needs to have several labs repeated today for her HGB and renal status. [name] anticipates returning back to work 3/5 after using this time to recover from her hospitalizations and get back on track with her DM and HTN.   Review of Systems  Constitutional: Negative for fever, chills, appetite change and fatigue.  Eyes: Negative for visual disturbance.  Respiratory: Positive for chest tightness. Negative for cough and shortness of breath.   Cardiovascular: Negative for chest pain and leg swelling.  Gastrointestinal: Negative for nausea, vomiting and diarrhea.  Genitourinary: Negative for urgency and frequency.  Neurological: Negative for dizziness, light-headedness and headaches.  Psychiatric/Behavioral: Negative for confusion.       Objective:   Physical Exam  Constitutional: She appears well-developed and well-nourished.  HENT:  Mouth/Throat: Oropharynx is clear and moist.  Eyes: No scleral icterus.  Cardiovascular: Normal rate and regular rhythm.   Pulmonary/Chest: Effort normal and breath sounds normal.          Assessment & Plan:   1. Renal failure (ARF), acute on chronic  Basic metabolic panel  2. Anemia  CBC with Differential  3. DM type 2, controlled, with complication  POCT glucose (manual entry)  4. Essential hypertension, benign      BMET, CBC/diff Continue to hold Lisinopril HCT for now as BP is stable and await BMET results. Return to work 3/5 without any restrictions. Recheck 1 month.

## 2011-05-08 NOTE — Patient Instructions (Signed)
Continue to HOLD Lisinopril. Return in 1 month for recheck.

## 2011-05-29 ENCOUNTER — Other Ambulatory Visit: Payer: Self-pay | Admitting: Physician Assistant

## 2011-06-05 ENCOUNTER — Ambulatory Visit (INDEPENDENT_AMBULATORY_CARE_PROVIDER_SITE_OTHER): Payer: 59 | Admitting: Physician Assistant

## 2011-06-05 VITALS — BP 117/77 | HR 94 | Temp 98.1°F | Resp 16 | Ht 61.5 in | Wt 124.0 lb

## 2011-06-05 DIAGNOSIS — E119 Type 2 diabetes mellitus without complications: Secondary | ICD-10-CM

## 2011-06-05 DIAGNOSIS — D649 Anemia, unspecified: Secondary | ICD-10-CM

## 2011-06-05 DIAGNOSIS — I10 Essential (primary) hypertension: Secondary | ICD-10-CM

## 2011-06-05 DIAGNOSIS — E789 Disorder of lipoprotein metabolism, unspecified: Secondary | ICD-10-CM

## 2011-06-05 DIAGNOSIS — N179 Acute kidney failure, unspecified: Secondary | ICD-10-CM

## 2011-06-05 LAB — GLUCOSE, POCT (MANUAL RESULT ENTRY): POC Glucose: 178

## 2011-06-05 MED ORDER — ATORVASTATIN CALCIUM 10 MG PO TABS: 10.0000 mg | ORAL_TABLET | Freq: Every day | ORAL | Status: DC

## 2011-06-06 LAB — CBC WITH DIFFERENTIAL/PLATELET
Basophils Absolute: 0 10*3/uL (ref 0.0–0.1)
Basophils Relative: 0 % (ref 0–1)
HCT: 33.2 % — ABNORMAL LOW (ref 36.0–46.0)
Lymphocytes Relative: 48 % — ABNORMAL HIGH (ref 12–46)
MCHC: 34 g/dL (ref 30.0–36.0)
Monocytes Absolute: 0.5 10*3/uL (ref 0.1–1.0)
Neutro Abs: 1.8 10*3/uL (ref 1.7–7.7)
Neutrophils Relative %: 39 % — ABNORMAL LOW (ref 43–77)
RDW: 13.7 % (ref 11.5–15.5)
WBC: 4.8 10*3/uL (ref 4.0–10.5)

## 2011-06-06 LAB — BASIC METABOLIC PANEL
BUN: 20 mg/dL (ref 6–23)
Chloride: 100 mEq/L (ref 96–112)
Potassium: 4.2 mEq/L (ref 3.5–5.3)
Sodium: 136 mEq/L (ref 135–145)

## 2011-06-06 NOTE — Progress Notes (Signed)
  Subjective:    Patient ID: [name], female    DOB: 06/03/1950, 61 y.o.   MRN: [id number]  HPI [name] is here today for f/u labs and recheck.  Her Cr was slightly elevated for several weeks after illness that required hospitalization in January 2013.  She has now been off Lisinopril for 1 month. We need to recheck her Renal tests today as well as her HGB that dropped while in the hospital as well and has been slowly climbing up. She feels good and has no concerns today.  Review of Systems  Constitutional: Negative for fever, chills, appetite change and fatigue.  Eyes: Negative for visual disturbance.  Respiratory: Negative for chest tightness.   Cardiovascular: Negative for chest pain and palpitations.  Gastrointestinal: Negative for nausea, vomiting and diarrhea.  Neurological: Negative for dizziness and headaches.       Objective:   Physical Exam  Constitutional: She is oriented to person, place, and time. She appears well-developed and well-nourished.  Cardiovascular: Normal rate and regular rhythm.   Pulmonary/Chest: Effort normal and breath sounds normal.  Musculoskeletal: She exhibits no edema.  Neurological: She is alert and oriented to person, place, and time.         Assessment & Plan:  HTN DM II ARF, resolving Anemia  Check BMET and CBC today. Continue to hold lisinopril until labs returned. Recheck 1 month.

## 2011-09-11 ENCOUNTER — Ambulatory Visit: Payer: 59 | Admitting: Physician Assistant

## 2012-03-31 ENCOUNTER — Emergency Department (HOSPITAL_COMMUNITY)
Admission: EM | Admit: 2012-03-31 | Discharge: 2012-03-31 | Disposition: A | Discharge status: Home / Self Care | Payer: Worker's Compensation | Attending: Emergency Medicine | Admitting: Emergency Medicine

## 2012-03-31 ENCOUNTER — Encounter (HOSPITAL_COMMUNITY): Payer: Self-pay | Admitting: Emergency Medicine

## 2012-03-31 DIAGNOSIS — Y9229 Other specified public building as the place of occurrence of the external cause: Secondary | ICD-10-CM | POA: Insufficient documentation

## 2012-03-31 DIAGNOSIS — Z79899 Other long term (current) drug therapy: Secondary | ICD-10-CM | POA: Insufficient documentation

## 2012-03-31 DIAGNOSIS — Y9389 Activity, other specified: Secondary | ICD-10-CM | POA: Insufficient documentation

## 2012-03-31 DIAGNOSIS — E785 Hyperlipidemia, unspecified: Secondary | ICD-10-CM | POA: Insufficient documentation

## 2012-03-31 DIAGNOSIS — T148XXA Other injury of unspecified body region, initial encounter: Secondary | ICD-10-CM

## 2012-03-31 DIAGNOSIS — E119 Type 2 diabetes mellitus without complications: Secondary | ICD-10-CM | POA: Insufficient documentation

## 2012-03-31 DIAGNOSIS — Z862 Personal history of diseases of the blood and blood-forming organs and certain disorders involving the immune mechanism: Secondary | ICD-10-CM | POA: Insufficient documentation

## 2012-03-31 DIAGNOSIS — I10 Essential (primary) hypertension: Secondary | ICD-10-CM | POA: Insufficient documentation

## 2012-03-31 DIAGNOSIS — I517 Cardiomegaly: Secondary | ICD-10-CM | POA: Insufficient documentation

## 2012-03-31 DIAGNOSIS — Z87891 Personal history of nicotine dependence: Secondary | ICD-10-CM | POA: Insufficient documentation

## 2012-03-31 DIAGNOSIS — IMO0002 Reserved for concepts with insufficient information to code with codable children: Secondary | ICD-10-CM | POA: Insufficient documentation

## 2012-03-31 DIAGNOSIS — Z7982 Long term (current) use of aspirin: Secondary | ICD-10-CM | POA: Insufficient documentation

## 2012-03-31 DIAGNOSIS — W19XXXA Unspecified fall, initial encounter: Secondary | ICD-10-CM | POA: Insufficient documentation

## 2012-03-31 MED ORDER — METHOCARBAMOL 500 MG PO TABS: 500.0000 mg | ORAL_TABLET | Freq: Two times a day (BID) | ORAL | Status: DC

## 2012-03-31 NOTE — ED Notes (Signed)
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 Pt struck back of head on concrete. Denies LOC or dizziness at scene. Stated that she was assessed by school nurse after the incident

## 2012-03-31 NOTE — ED Provider Notes (Signed)
History     CSN: [id number]  Arrival date & time 03/31/12  1040   First MD Initiated Contact with Patient 03/31/12 1214      Chief Complaint  Patient presents with  . Back Pain    pt reports a fall 4 days ago, denies LOC. Struck head on concrete. Assessed by "school nurse" at scene    (Consider location/radiation/quality/duration/timing/severity/associated sxs/prior treatment) Patient is a 61 y.o. female presenting with back pain. The history is provided by the patient.  Back Pain    patient here complaining of back pain for 4 days. Patient pulled a back muscle were trying to assist client. No change in bowel or bladder function. Pain characterized as sharp and localized at her midthoracic region. No medications taken for this prior to arrival. Pain is worse with movement and has been improving. She also admits that she has not taken her normal blood pressure medications this morning. Denies any chest pain, headache or dizziness.  Past Medical History  Diagnosis Date  . Hypertension   . Diabetes mellitus   . Anemia   . Hyperlipidemia   . Left ventricular hypertrophy     Past Surgical History  Procedure Date  . Cholecystectomy     Family History  Problem Relation Age of Onset  . Diabetes type II    . Hypertension      History  Substance Use Topics  . Smoking status: Former Smoker    Types: Cigarettes    Quit date: 03/19/1966  . Smokeless tobacco: Never Used  . Alcohol Use: No    OB History    Grav Para Term Preterm Abortions TAB SAB Ect Mult Living                  Review of Systems  Musculoskeletal: Positive for back pain.  All other systems reviewed and are negative.    Allergies  Nitrofurantoin monohyd macro  Home Medications   Current Outpatient Rx  Name  Route  Sig  Dispense  Refill  . AMLODIPINE BESYLATE 5 MG PO TABS   Oral   Take 5 mg by mouth every morning.         . ASPIRIN EC 81 MG PO TBEC   Oral   Take 81 mg by mouth every  morning.         . ATORVASTATIN CALCIUM 10 MG PO TABS   Oral   Take 10 mg by mouth every morning.         Marland Kitchen CLONIDINE HCL 0.1 MG PO TABS      TAKE ONE TABLET BY MOUTH EVERY 12 HOURS   180 tablet   0   . GLIMEPIRIDE 4 MG PO TABS      TAKE ONE TABLET BY MOUTH TWICE DAILY   180 tablet   0   . LISINOPRIL-HYDROCHLOROTHIAZIDE 20-12.5 MG PO TABS               . METFORMIN HCL 1000 MG PO TABS      TAKE ONE TABLET BY MOUTH TWICE DAILY   180 tablet   0   . PRAVASTATIN SODIUM 40 MG PO TABS   Oral   Take 40 mg by mouth at bedtime.           BP 220/120  Pulse 85  Temp 98.3 F (36.8 C) (Oral)  Resp 18  SpO2 97%  Physical Exam  Nursing note and vitals reviewed. Constitutional: She is oriented to person, place, and time. She  appears well-developed and well-nourished.  Non-toxic appearance. No distress.  HENT:  Head: Normocephalic and atraumatic.  Eyes: Conjunctivae normal, EOM and lids are normal. Pupils are equal, round, and reactive to light.  Neck: Normal range of motion. Neck supple. No tracheal deviation present. No mass present.  Cardiovascular: Normal rate, regular rhythm and normal heart sounds.  Exam reveals no gallop.   No murmur heard. Pulmonary/Chest: Effort normal and breath sounds normal. No stridor. No respiratory distress. She has no decreased breath sounds. She has no wheezes. She has no rhonchi. She has no rales.  Abdominal: Soft. Normal appearance and bowel sounds are normal. She exhibits no distension. There is no tenderness. There is no rebound and no CVA tenderness.  Musculoskeletal: Normal range of motion. She exhibits no edema and no tenderness.       Arms: Neurological: She is alert and oriented to person, place, and time. She has normal strength. No cranial nerve deficit or sensory deficit. GCS eye subscore is 4. GCS verbal subscore is 5. GCS motor subscore is 6.  Skin: Skin is warm and dry. No abrasion and no rash noted.  Psychiatric: She has  a normal mood and affect. Her speech is normal and behavior is normal.    ED Course  Procedures (including critical care time)  Labs Reviewed - No data to display No results found.   No diagnosis found.    MDM  Patient blood pressure noted and she is asymptomatic at this time. She will take her normal daily meds which is home. Her pain is musculoskeletal in nature and will be given muscle relaxants and she is be discharged home        Toy Baker, MD 03/31/12 1222

## 2012-04-02 ENCOUNTER — Ambulatory Visit (INDEPENDENT_AMBULATORY_CARE_PROVIDER_SITE_OTHER): Payer: 59 | Admitting: Family Medicine

## 2012-04-02 VITALS — BP 180/99 | HR 106 | Temp 98.2°F | Resp 16 | Ht 62.0 in | Wt 123.0 lb

## 2012-04-02 DIAGNOSIS — D649 Anemia, unspecified: Secondary | ICD-10-CM

## 2012-04-02 DIAGNOSIS — E119 Type 2 diabetes mellitus without complications: Secondary | ICD-10-CM

## 2012-04-02 DIAGNOSIS — I10 Essential (primary) hypertension: Secondary | ICD-10-CM

## 2012-04-02 DIAGNOSIS — E785 Hyperlipidemia, unspecified: Secondary | ICD-10-CM

## 2012-04-02 LAB — POCT CBC
Granulocyte percent: 51.7 % (ref 37–80)
HCT, POC: 39.5 % (ref 37.7–47.9)
Hemoglobin: 12.2 g/dL (ref 12.2–16.2)
Lymph, poc: 2.4 (ref 0.6–3.4)
MCH, POC: 27.2 pg (ref 27–31.2)
MCHC: 30.9 g/dL — AB (ref 31.8–35.4)
MCV: 88 fL (ref 80–97)
MID (cbc): 0.3 (ref 0–0.9)
MPV: 8.2 fL (ref 0–99.8)
POC Granulocyte: 2.9 (ref 2–6.9)
POC LYMPH PERCENT: 42.9 %L (ref 10–50)
POC MID %: 5.4 %M (ref 0–12)
Platelet Count, POC: 345 10*3/uL (ref 142–424)
RBC: 4.49 M/uL (ref 4.04–5.48)
RDW, POC: 13.3 %
WBC: 5.6 10*3/uL (ref 4.6–10.2)

## 2012-04-02 LAB — COMPREHENSIVE METABOLIC PANEL
ALT: 10 U/L (ref 0–35)
AST: 13 U/L (ref 0–37)
BUN: 18 mg/dL (ref 6–23)
Calcium: 9.4 mg/dL (ref 8.4–10.5)
Chloride: 100 mEq/L (ref 96–112)
Creat: 1.26 mg/dL — ABNORMAL HIGH (ref 0.50–1.10)
Total Bilirubin: 0.4 mg/dL (ref 0.3–1.2)

## 2012-04-02 LAB — LIPID PANEL
Cholesterol: 277 mg/dL — ABNORMAL HIGH (ref 0–200)
HDL: 70 mg/dL (ref >39)
LDL Cholesterol: 180 mg/dL — ABNORMAL HIGH (ref 0–99)
Total CHOL/HDL Ratio: 4 Ratio
Triglycerides: 137 mg/dL (ref <150)
VLDL: 27 mg/dL (ref 0–40)

## 2012-04-02 LAB — COMPREHENSIVE METABOLIC PANEL WITH GFR
Albumin: 3.3 g/dL — ABNORMAL LOW (ref 3.5–5.2)
Alkaline Phosphatase: 76 U/L (ref 39–117)
CO2: 28 meq/L (ref 19–32)
Glucose, Bld: 145 mg/dL — ABNORMAL HIGH (ref 70–99)
Potassium: 3.8 meq/L (ref 3.5–5.3)
Sodium: 137 meq/L (ref 135–145)
Total Protein: 5.9 g/dL — ABNORMAL LOW (ref 6.0–8.3)

## 2012-04-02 LAB — POCT GLYCOSYLATED HEMOGLOBIN (HGB A1C): Hemoglobin A1C: 13.8

## 2012-04-02 LAB — TSH: TSH: 1.083 u[IU]/mL (ref 0.350–4.500)

## 2012-04-02 MED ORDER — METFORMIN HCL 1000 MG PO TABS: 1000.0000 mg | ORAL_TABLET | Freq: Two times a day (BID) | ORAL | Status: DC

## 2012-04-02 MED ORDER — GLIMEPIRIDE 4 MG PO TABS: 4.0000 mg | ORAL_TABLET | Freq: Two times a day (BID) | ORAL | Status: DC

## 2012-04-02 MED ORDER — CLONIDINE HCL 0.1 MG PO TABS: 0.1000 mg | ORAL_TABLET | Freq: Two times a day (BID) | ORAL | Status: DC

## 2012-04-02 MED ORDER — LISINOPRIL-HYDROCHLOROTHIAZIDE 20-12.5 MG PO TABS: 1.0000 | ORAL_TABLET | Freq: Every day | ORAL | Status: DC

## 2012-04-02 MED ORDER — AMLODIPINE BESYLATE 5 MG PO TABS: 5.0000 mg | ORAL_TABLET | Freq: Every morning | ORAL | Status: DC

## 2012-04-02 MED ORDER — ATORVASTATIN CALCIUM 10 MG PO TABS: 10.0000 mg | ORAL_TABLET | Freq: Every morning | ORAL | Status: DC

## 2012-04-02 NOTE — Progress Notes (Signed)
[facility]:  Office Visit  Chief Complaint:  Chief Complaint  Patient presents with  . Hypertension    high bp- no doctor to rx medication any longer    HPI: [name] is a 62 y.o. female who complains of elevated BP.Was seen in ER 03/31/2012 for job related injury and was found to have elevated BP, Has not been taking meds for 2 months. She still has metformin so was still taking that. She has no sxs with meds when she was taking meds. Denies any current sxs of Dizziness,HA, CP, palpitations, nausea,vomiting, abd pain, fatigue, SOB, pedal edema.   HTN-no SEs with meds, not taking meds x at least 2 months. XOL-no SEs with meds, not taking meds x at least 2 months DM-no SEs with meds, states she is taking her meds, still has metformin left. Denies neuropathy or vision issues. Eye exam not UTD  Past Medical History  Diagnosis Date  . Hypertension   . Diabetes mellitus   . Anemia   . Hyperlipidemia   . Left ventricular hypertrophy    Past Surgical History  Procedure Date  . Cholecystectomy    History   Social History  . Marital Status: Married    Spouse Name: N/A    Number of Children: N/A  . Years of Education: N/A   Social History Main Topics  . Smoking status: Former Smoker    Types: Cigarettes    Quit date: 03/19/1966  . Smokeless tobacco: Never Used  . Alcohol Use: No  . Drug Use: No  . Sexually Active: None   Other Topics Concern  . None   Social History Narrative  . None   Family History  Problem Relation Age of Onset  . Diabetes type II    . Hypertension     Allergies  Allergen Reactions  . Nitrofurantoin Monohyd Macro Nausea Only   Prior to Admission medications   Medication Sig Start Date End Date Taking? Authorizing Provider  aspirin EC 81 MG tablet Take 81 mg by mouth every morning.   Yes Historical Provider, MD  metFORMIN (GLUCOPHAGE) 1000 MG tablet TAKE ONE TABLET BY MOUTH TWICE DAILY 05/29/11  Yes Ryan M Dunn, PA-C    amLODipine (NORVASC) 5 MG tablet Take 5 mg by mouth every morning.    Historical Provider, MD  atorvastatin (LIPITOR) 10 MG tablet Take 10 mg by mouth every morning. 06/05/11 06/04/12  Pattricia Boss, PA-C  cloNIDine (CATAPRES) 0.1 MG tablet TAKE ONE TABLET BY MOUTH EVERY 12 HOURS 05/29/11   Sondra Barges, PA-C  glimepiride (AMARYL) 4 MG tablet TAKE ONE TABLET BY MOUTH TWICE DAILY 05/29/11   Sondra Barges, PA-C  lisinopril-hydrochlorothiazide (PRINZIDE,ZESTORETIC) 20-12.5 MG per tablet  04/11/11   Historical Provider, MD  methocarbamol (ROBAXIN) 500 MG tablet Take 1 tablet (500 mg total) by mouth 2 (two) times daily. 03/31/12   Toy Baker, MD  pravastatin (PRAVACHOL) 40 MG tablet Take 40 mg by mouth at bedtime.    Historical Provider, MD     ROS: The patient denies fevers, chills, night sweats, unintentional weight loss, chest pain, palpitations, wheezing, dyspnea on exertion, nausea, vomiting, abdominal pain, dysuria, hematuria, melena, numbness, weakness, or tingling.   All other systems have been reviewed and were otherwise negative with the exception of those mentioned in the HPI and as above.    PHYSICAL EXAM: Filed Vitals:   04/02/12 1512  BP: 180/99  Pulse: 106  Temp:   Resp:  Filed Vitals:   04/02/12 1511  Height: 5\' 2"  (1.575 m)  Weight: 123 lb (55.792 kg)   Body mass index is 22.50 kg/(m^2).  General: Alert, no acute distress. Thin flat affect elderly female who looks older than her stated age HEENT:  Normocephalic, atraumatic, oropharynx patent. EOMI, PERRLA, fundoscopic exam grossly nl Cardiovascular:  Regular rate and rhythm, no rubs murmurs or gallops.  No Carotid bruits, radial pulse intact. No pedal edema.  Respiratory: Clear to auscultation bilaterally.  No wheezes, rales, or rhonchi.  No cyanosis, no use of accessory musculature GI: No organomegaly, abdomen is soft and non-tender, positive bowel sounds.  No masses. Skin: No rashes. Neurologic: Facial musculature  symmetric. Psychiatric: Patient is appropriate throughout our interaction. Lymphatic: No cervical lymphadenopathy Musculoskeletal: Gait intact.   LABS: Results for orders placed in visit on 04/02/12  POCT CBC      Component Value Range   WBC 5.6  4.6 - 10.2 K/uL   Lymph, poc 2.4  0.6 - 3.4   POC LYMPH PERCENT 42.9  10 - 50 %L   MID (cbc) 0.3  0 - 0.9   POC MID % 5.4  0 - 12 %M   POC Granulocyte 2.9  2 - 6.9   Granulocyte percent 51.7  37 - 80 %G   RBC 4.49  4.04 - 5.48 M/uL   Hemoglobin 12.2  12.2 - 16.2 g/dL   HCT, POC 16.1  09.6 - 47.9 %   MCV 88.0  80 - 97 fL   MCH, POC 27.2  27 - 31.2 pg   MCHC 30.9 (*) 31.8 - 35.4 g/dL   RDW, POC 04.5     Platelet Count, POC 345  142 - 424 K/uL   MPV 8.2  0 - 99.8 fL  POCT GLYCOSYLATED HEMOGLOBIN (HGB A1C)      Component Value Range   Hemoglobin A1C 13.8       EKG/XRAY:   Primary read interpreted by Dr. Conley Rolls at [facility].   ASSESSMENT/PLAN: Encounter Diagnoses  Name Primary?  . HTN (hypertension) Yes  . Diabetes   . Hyperlipidemia   . Anemia    Poorly controlled HTN, T2DM I suspect it has been this way for a long time. Per chart review she has not been evaluated since March 2013 by Kennedy Bucker PA. Prior to that in 03/2011 she was discharged from hospital for viral gastroenteritis with ARF with creatinine of 2.46 so her zestoretic was held.  Refilled meds. Need to take BP, DM, XOL  meds If no improvement then need to return to follow-up sooner than 1 week. Advise to f/u if BP consistently >140/90. Goal is to be < 130/80 since she is diabetic F/u in 1 week for repeat BMP since has a h/o elevated creatinine ? On ACEI vs dehydration due to illness. I was not able to establish in chart review whether patient actually resumed all her meds including her Lisinopril/HCTZ. IF she did as she states today then I will refill her meds, and recheck her labs and then bring her back in 1 week to recheck labs to cee if there is a > 30% bump in the  creatinine level.  Labs pending   Rockne Coons, DO 04/04/2012 3:19 PM

## 2012-04-03 ENCOUNTER — Ambulatory Visit: Payer: 59 | Admitting: Physician Assistant

## 2012-04-03 ENCOUNTER — Telehealth: Payer: Self-pay

## 2012-04-03 LAB — MICROALBUMIN / CREATININE URINE RATIO
Creatinine, Urine: 166.9 mg/dL
Microalb, Ur: >600 mg/dL — ABNORMAL HIGH (ref 0.00–1.89)

## 2012-04-03 NOTE — Telephone Encounter (Signed)
Patient states that she needs a note for today and tomorrow for her work.    Best#: [phone]

## 2012-04-03 NOTE — Telephone Encounter (Signed)
Patient advised note at front desk for pick up

## 2012-04-04 ENCOUNTER — Telehealth: Payer: Self-pay | Admitting: Family Medicine

## 2012-04-04 NOTE — Telephone Encounter (Signed)
No answer, no voicemail.

## 2012-05-16 ENCOUNTER — Encounter (HOSPITAL_COMMUNITY): Payer: Self-pay | Admitting: *Deleted

## 2012-05-16 ENCOUNTER — Inpatient Hospital Stay (HOSPITAL_COMMUNITY)
Admission: EM | Admit: 2012-05-16 | Discharge: 2012-05-18 | DRG: 683 | Disposition: A | Discharge status: Home / Self Care | Payer: 59 | Attending: Family Medicine | Admitting: Family Medicine

## 2012-05-16 DIAGNOSIS — D649 Anemia, unspecified: Secondary | ICD-10-CM | POA: Diagnosis present

## 2012-05-16 DIAGNOSIS — Z87891 Personal history of nicotine dependence: Secondary | ICD-10-CM

## 2012-05-16 DIAGNOSIS — E876 Hypokalemia: Secondary | ICD-10-CM | POA: Diagnosis present

## 2012-05-16 DIAGNOSIS — K3184 Gastroparesis: Secondary | ICD-10-CM | POA: Diagnosis present

## 2012-05-16 DIAGNOSIS — I4510 Unspecified right bundle-branch block: Secondary | ICD-10-CM | POA: Diagnosis present

## 2012-05-16 DIAGNOSIS — N179 Acute kidney failure, unspecified: Principal | ICD-10-CM | POA: Diagnosis present

## 2012-05-16 DIAGNOSIS — E119 Type 2 diabetes mellitus without complications: Secondary | ICD-10-CM | POA: Diagnosis present

## 2012-05-16 DIAGNOSIS — I10 Essential (primary) hypertension: Secondary | ICD-10-CM | POA: Diagnosis present

## 2012-05-16 DIAGNOSIS — A088 Other specified intestinal infections: Secondary | ICD-10-CM | POA: Diagnosis present

## 2012-05-16 DIAGNOSIS — E785 Hyperlipidemia, unspecified: Secondary | ICD-10-CM | POA: Diagnosis present

## 2012-05-16 DIAGNOSIS — N390 Urinary tract infection, site not specified: Secondary | ICD-10-CM | POA: Diagnosis present

## 2012-05-16 DIAGNOSIS — Z79899 Other long term (current) drug therapy: Secondary | ICD-10-CM

## 2012-05-16 LAB — URINE MICROSCOPIC-ADD ON

## 2012-05-16 LAB — CBC WITH DIFFERENTIAL/PLATELET
Basophils Absolute: 0 10*3/uL (ref 0.0–0.1)
Eosinophils Absolute: 0 10*3/uL (ref 0.0–0.7)
Eosinophils Relative: 0 % (ref 0–5)
Lymphocytes Relative: 24 % (ref 12–46)
Lymphs Abs: 1.8 10*3/uL (ref 0.7–4.0)
MCH: 28.8 pg (ref 26.0–34.0)
MCV: 80 fL (ref 78.0–100.0)
Neutrophils Relative %: 64 % (ref 43–77)
Platelets: 370 10*3/uL (ref 150–400)
RBC: 4.79 MIL/uL (ref 3.87–5.11)
RDW: 13.2 % (ref 11.5–15.5)
WBC: 7.6 10*3/uL (ref 4.0–10.5)

## 2012-05-16 LAB — URINALYSIS, ROUTINE W REFLEX MICROSCOPIC
Nitrite: NEGATIVE
Protein, ur: >300 mg/dL — AB
Specific Gravity, Urine: 1.023 (ref 1.005–1.030)
Urobilinogen, UA: 0.2 mg/dL (ref 0.0–1.0)

## 2012-05-16 LAB — COMPREHENSIVE METABOLIC PANEL
ALT: 13 U/L (ref 0–35)
AST: 21 U/L (ref 0–37)
Alkaline Phosphatase: 86 U/L (ref 39–117)
Calcium: 9.2 mg/dL (ref 8.4–10.5)
Potassium: 3 mEq/L — ABNORMAL LOW (ref 3.5–5.1)
Sodium: 134 mEq/L — ABNORMAL LOW (ref 135–145)
Total Protein: 6.8 g/dL (ref 6.0–8.3)

## 2012-05-16 MED ORDER — ONDANSETRON HCL 4 MG/2ML IJ SOLN
4.0000 mg | Freq: Once | INTRAMUSCULAR | Status: AC
  Administered 2012-05-17: 4 mg via INTRAVENOUS
  Filled 2012-05-16: qty 2

## 2012-05-16 MED ORDER — SODIUM CHLORIDE 0.9 % IV BOLUS (SEPSIS)
1000.0000 mL | Freq: Once | INTRAVENOUS | Status: AC
  Administered 2012-05-17: 1000 mL via INTRAVENOUS

## 2012-05-16 MED ORDER — DEXTROSE 5 % IV SOLN
1.0000 g | Freq: Once | INTRAVENOUS | Status: AC
  Administered 2012-05-17: 1 g via INTRAVENOUS
  Filled 2012-05-16: qty 10

## 2012-05-16 NOTE — ED Notes (Signed)
Vomiting for 3 days no diarrhea. abd cramps only

## 2012-05-16 NOTE — ED Provider Notes (Signed)
History     CSN: [id number]  Arrival date & time 05/16/12  4098   First MD Initiated Contact with Patient 05/16/12 2259      Chief Complaint  Patient presents with  . Emesis   HPI  History provided by the patient and husband. Patient is a 62 year old African American female with history of hypertension, hyperlipidemia, diabetes and chronic kidney disease who presents with episodes of nausea and vomiting. Patient reports 2-3 episodes of vomiting each day for the last 3 days. Symptoms are associated with decreased appetite and chills. Patient denies any aggravating or alleviating factors. Denies any associated pains or abdominal pain. Patient has been able to sip on fluids. She denies any diarrhea or constipation symptoms. Denies any dysuria, hematuria, urinary frequency or flank pain. Denies any fever or sweats. Denies any recent known sick contacts. Denies any recent travel. Patient states her blood sugars have been doing very good this past week.     Past Medical History  Diagnosis Date  . Hypertension   . Diabetes mellitus   . Anemia   . Hyperlipidemia   . Left ventricular hypertrophy     Past Surgical History  Procedure Laterality Date  . Cholecystectomy      Family History  Problem Relation Age of Onset  . Diabetes type II    . Hypertension      History  Substance Use Topics  . Smoking status: Former Smoker    Types: Cigarettes    Quit date: 03/19/1966  . Smokeless tobacco: Never Used  . Alcohol Use: No    OB History   Grav Para Term Preterm Abortions TAB SAB Ect Mult Living                  Review of Systems  Constitutional: Positive for chills and appetite change. Negative for fever.  Respiratory: Negative for cough and shortness of breath.   Cardiovascular: Negative for chest pain and leg swelling.  Gastrointestinal: Positive for nausea and vomiting. Negative for abdominal pain, diarrhea and constipation.  Genitourinary: Negative for dysuria,  frequency, hematuria, flank pain, decreased urine volume and difficulty urinating.  All other systems reviewed and are negative.    Allergies  Nitrofurantoin monohyd macro  Home Medications   Current Outpatient Rx  Name  Route  Sig  Dispense  Refill  . amLODipine (NORVASC) 5 MG tablet   Oral   Take 5 mg by mouth every morning.         Marland Kitchen aspirin EC 81 MG tablet   Oral   Take 81 mg by mouth every morning.         Marland Kitchen atorvastatin (LIPITOR) 10 MG tablet   Oral   Take 10 mg by mouth every morning.         . cloNIDine (CATAPRES) 0.1 MG tablet   Oral   Take 0.1 mg by mouth 2 (two) times daily.         Marland Kitchen glimepiride (AMARYL) 4 MG tablet   Oral   Take 4 mg by mouth 2 (two) times daily with a meal.         . lisinopril-hydrochlorothiazide (PRINZIDE,ZESTORETIC) 20-12.5 MG per tablet   Oral   Take 1 tablet by mouth daily.         . metFORMIN (GLUCOPHAGE) 1000 MG tablet   Oral   Take 1,000 mg by mouth 2 (two) times daily with a meal.         . pravastatin (PRAVACHOL) 40 MG  tablet   Oral   Take 40 mg by mouth at bedtime.           BP 107/76  Pulse 96  Temp(Src) 98.6 F (37 C) (Oral)  Resp 21  SpO2 99%  Physical Exam  Nursing note and vitals reviewed. Constitutional: She is oriented to person, place, and time. She appears well-developed and well-nourished. No distress.  HENT:  Head: Normocephalic and atraumatic.  Mouth/Throat: Oropharynx is clear and moist.  Cardiovascular: Normal rate and regular rhythm.   Pulmonary/Chest: Effort normal and breath sounds normal. No respiratory distress. She has no wheezes. She has no rales.  Abdominal: Soft. She exhibits no mass. There is no tenderness. There is no rebound and no guarding.  Musculoskeletal: Normal range of motion. She exhibits no edema.  Neurological: She is alert and oriented to person, place, and time.  Skin: Skin is warm and dry. No rash noted.  Psychiatric: She has a normal mood and affect. Her  behavior is normal.    ED Course  Procedures  Results for orders placed during the hospital encounter of 05/16/12  URINALYSIS, ROUTINE W REFLEX MICROSCOPIC      Result Value Range   Color, Urine YELLOW  YELLOW   APPearance CLOUDY (*) CLEAR   Specific Gravity, Urine 1.023  1.005 - 1.030   pH 6.0  5.0 - 8.0   Glucose, UA 500 (*) NEGATIVE mg/dL   Hgb urine dipstick LARGE (*) NEGATIVE   Bilirubin Urine NEGATIVE  NEGATIVE   Ketones, ur NEGATIVE  NEGATIVE mg/dL   Protein, ur >782 (*) NEGATIVE mg/dL   Urobilinogen, UA 0.2  0.0 - 1.0 mg/dL   Nitrite NEGATIVE  NEGATIVE   Leukocytes, UA SMALL (*) NEGATIVE  CBC WITH DIFFERENTIAL      Result Value Range   WBC 7.6  4.0 - 10.5 K/uL   RBC 4.79  3.87 - 5.11 MIL/uL   Hemoglobin 13.8  12.0 - 15.0 g/dL   HCT 95.6  21.3 - 08.6 %   MCV 80.0  78.0 - 100.0 fL   MCH 28.8  26.0 - 34.0 pg   MCHC 36.0  30.0 - 36.0 g/dL   RDW 57.8  46.9 - 62.9 %   Platelets 370  150 - 400 K/uL   Neutrophils Relative 64  43 - 77 %   Neutro Abs 4.9  1.7 - 7.7 K/uL   Lymphocytes Relative 24  12 - 46 %   Lymphs Abs 1.8  0.7 - 4.0 K/uL   Monocytes Relative 12  3 - 12 %   Monocytes Absolute 0.9  0.1 - 1.0 K/uL   Eosinophils Relative 0  0 - 5 %   Eosinophils Absolute 0.0  0.0 - 0.7 K/uL   Basophils Relative 0  0 - 1 %   Basophils Absolute 0.0  0.0 - 0.1 K/uL  COMPREHENSIVE METABOLIC PANEL      Result Value Range   Sodium 134 (*) 135 - 145 mEq/L   Potassium 3.0 (*) 3.5 - 5.1 mEq/L   Chloride 92 (*) 96 - 112 mEq/L   CO2 29  19 - 32 mEq/L   Glucose, Bld 206 (*) 70 - 99 mg/dL   BUN 41 (*) 6 - 23 mg/dL   Creatinine, Ser 5.28 (*) 0.50 - 1.10 mg/dL   Calcium 9.2  8.4 - 41.3 mg/dL   Total Protein 6.8  6.0 - 8.3 g/dL   Albumin 3.0 (*) 3.5 - 5.2 g/dL   AST 21  0 -  37 U/L   ALT 13  0 - 35 U/L   Alkaline Phosphatase 86  39 - 117 U/L   Total Bilirubin 0.3  0.3 - 1.2 mg/dL   GFR calc non Af Amer 17 (*) >90 mL/min   GFR calc Af Amer 20 (*) >90 mL/min  URINE MICROSCOPIC-ADD  ON      Result Value Range   Squamous Epithelial / LPF FEW (*) RARE   WBC, UA 21-50  <3 WBC/hpf   RBC / HPF 7-10  <3 RBC/hpf   Bacteria, UA FEW (*) RARE   Casts HYALINE CASTS (*) NEGATIVE  GLUCOSE, CAPILLARY      Result Value Range   Glucose-Capillary 187 (*) 70 - 99 mg/dL        1. Nausea & vomiting   2. Acute renal insufficiency   3. UTI (lower urinary tract infection)       MDM  11:05 PM patient seen and evaluated. Patient currently reports having slight improvement of symptoms. She is resting in bed does not appear in any acute distress.  Labs show signs of slight dehydration with worsened renal function as well.IV fluids and Zofran ordered.  Pt seen and evaluated with attending Physician.  Will plan for admission for observation of renal function and UTI.  Spoke with family practice resident.  They will see pt and admit.      Angus Seller, [place] 05/17/12 [phone]

## 2012-05-17 ENCOUNTER — Observation Stay (HOSPITAL_COMMUNITY): Payer: 59

## 2012-05-17 LAB — BASIC METABOLIC PANEL
CO2: 25 mEq/L (ref 19–32)
Chloride: 96 mEq/L (ref 96–112)
Creatinine, Ser: 3.12 mg/dL — ABNORMAL HIGH (ref 0.50–1.10)
Potassium: 3.8 mEq/L (ref 3.5–5.1)

## 2012-05-17 LAB — GLUCOSE, CAPILLARY
Glucose-Capillary: 73 mg/dL (ref 70–99)
Glucose-Capillary: 143 mg/dL — ABNORMAL HIGH (ref 70–99)
Glucose-Capillary: 55 mg/dL — ABNORMAL LOW (ref 70–99)

## 2012-05-17 MED ORDER — CLONIDINE HCL 0.1 MG PO TABS
0.1000 mg | ORAL_TABLET | Freq: Two times a day (BID) | ORAL | Status: DC
  Administered 2012-05-17 – 2012-05-18 (×3): 0.1 mg via ORAL
  Filled 2012-05-17 (×6): qty 1

## 2012-05-17 MED ORDER — PROMETHAZINE HCL 25 MG/ML IJ SOLN: 12.5000 mg | Freq: Four times a day (QID) | INTRAMUSCULAR | Status: DC | PRN

## 2012-05-17 MED ORDER — SODIUM CHLORIDE 0.9 % IV SOLN: 1000.0000 mL | Freq: Once | INTRAVENOUS | Status: DC

## 2012-05-17 MED ORDER — PROMETHAZINE HCL 25 MG PO TABS: 12.5000 mg | ORAL_TABLET | Freq: Four times a day (QID) | ORAL | Status: DC | PRN

## 2012-05-17 MED ORDER — SODIUM CHLORIDE 0.9 % IV BOLUS (SEPSIS)
2000.0000 mL | Freq: Once | INTRAVENOUS | Status: AC
  Administered 2012-05-17: 2000 mL via INTRAVENOUS

## 2012-05-17 MED ORDER — PROMETHAZINE HCL 25 MG RE SUPP: 12.5000 mg | Freq: Four times a day (QID) | RECTAL | Status: DC | PRN

## 2012-05-17 MED ORDER — HEPARIN SODIUM (PORCINE) 5000 UNIT/ML IJ SOLN
5000.0000 [IU] | Freq: Three times a day (TID) | INTRAMUSCULAR | Status: DC
  Administered 2012-05-17 – 2012-05-18 (×5): 5000 [IU] via SUBCUTANEOUS
  Filled 2012-05-17 (×7): qty 1

## 2012-05-17 MED ORDER — ONDANSETRON HCL 4 MG PO TABS: 4.0000 mg | ORAL_TABLET | Freq: Four times a day (QID) | ORAL | Status: DC | PRN

## 2012-05-17 MED ORDER — ONDANSETRON HCL 4 MG/2ML IJ SOLN: 4.0000 mg | Freq: Four times a day (QID) | INTRAMUSCULAR | Status: DC | PRN

## 2012-05-17 MED ORDER — ATORVASTATIN CALCIUM 10 MG PO TABS
10.0000 mg | ORAL_TABLET | Freq: Every morning | ORAL | Status: DC
  Administered 2012-05-17 – 2012-05-18 (×2): 10 mg via ORAL
  Filled 2012-05-17 (×3): qty 1

## 2012-05-17 MED ORDER — AMLODIPINE BESYLATE 5 MG PO TABS
5.0000 mg | ORAL_TABLET | Freq: Every morning | ORAL | Status: DC
  Administered 2012-05-17 – 2012-05-18 (×2): 5 mg via ORAL
  Filled 2012-05-17 (×4): qty 1

## 2012-05-17 MED ORDER — POTASSIUM CHLORIDE IN NACL 20-0.45 MEQ/L-% IV SOLN
INTRAVENOUS | Status: DC
  Administered 2012-05-17 – 2012-05-18 (×3): via INTRAVENOUS
  Filled 2012-05-17 (×6): qty 1000

## 2012-05-17 MED ORDER — GLUCOSE 40 % PO GEL
ORAL | Status: AC
  Administered 2012-05-17: 37.5 g via ORAL
  Filled 2012-05-17: qty 1

## 2012-05-17 MED ORDER — INSULIN ASPART 100 UNIT/ML ~~LOC~~ SOLN
0.0000 [IU] | Freq: Three times a day (TID) | SUBCUTANEOUS | Status: DC
  Administered 2012-05-17: 3 [IU] via SUBCUTANEOUS
  Administered 2012-05-17 – 2012-05-18 (×3): 2 [IU] via SUBCUTANEOUS

## 2012-05-17 MED ORDER — CEPHALEXIN 500 MG PO CAPS
500.0000 mg | ORAL_CAPSULE | Freq: Four times a day (QID) | ORAL | Status: DC
  Administered 2012-05-18 (×4): 500 mg via ORAL
  Filled 2012-05-17 (×8): qty 1

## 2012-05-17 MED ORDER — ALUM & MAG HYDROXIDE-SIMETH 200-200-20 MG/5ML PO SUSP
30.0000 mL | Freq: Four times a day (QID) | ORAL | Status: DC | PRN
  Filled 2012-05-17: qty 30

## 2012-05-17 MED ORDER — ASPIRIN EC 81 MG PO TBEC
81.0000 mg | DELAYED_RELEASE_TABLET | Freq: Every morning | ORAL | Status: DC
  Administered 2012-05-17 – 2012-05-18 (×2): 81 mg via ORAL
  Filled 2012-05-17 (×3): qty 1

## 2012-05-17 MED ORDER — POTASSIUM CHLORIDE 10 MEQ/100ML IV SOLN
10.0000 meq | INTRAVENOUS | Status: AC
  Administered 2012-05-17 (×4): 10 meq via INTRAVENOUS
  Filled 2012-05-17 (×4): qty 100

## 2012-05-17 NOTE — Progress Notes (Signed)
Hypoglycemic Event  CBG: 55  Treatment: 15 GM gel  Symptoms: None  Follow-up CBG: Time:2135 CBG Result:85  Possible Reasons for Event: Inadequate meal intake  Comments/MD notified:MD on call notified. Treatment time was delayed due to patient not wanting to eat snack or take Glucose Gel. Still encouraging patient to take both. Will continue to monitor.  Aydrian Halpin Loleta Chance, RN     [name], [name]  Remember to initiate Hypoglycemia Order Set & complete

## 2012-05-17 NOTE — ED Notes (Signed)
Report attempted. Nurse will call back.  

## 2012-05-17 NOTE — ED Provider Notes (Signed)
See prior note   Ward Givens, MD 05/17/12 [phone]

## 2012-05-17 NOTE — ED Notes (Signed)
Report given to rn on floor.  Pt transported to floor via stretcher.

## 2012-05-17 NOTE — ED Provider Notes (Signed)
Patient presents with 3 days of nausea and vomiting initially, she has persistent nausea and no appetite. She denies abdominal pain or diarrhea. She denies dysuria but does describe frequency. Patient looks like she feels bad.  On her laboratory results she is noted to have an acute worsening of her chronic renal insufficiency and a UTI.  Medical screening examination/treatment/procedure(s) were conducted as a shared visit with non-physician practitioner(s) and myself.  I personally evaluated the patient during the encounter   Medical screening examination/treatment/procedure(s) were conducted as a shared visit with non-physician practitioner(s) and myself.  I personally evaluated the patient during the encounter  Devoria Albe, MD, Franz Dell, MD 05/17/12 [phone]

## 2012-05-17 NOTE — H&P (Signed)
Family Medicine Teaching [facility] Admission History and Physical Service Pager: [phone]  Patient name: [name] Medical record number: [id number] Date of birth: Mar 17, 1951 Age: 62 y.o. Gender: female  Primary Care Provider: Corey Harold, MD  Chief Complaint: nausea and vomiting for 3 days  Assessment and Plan: [name] is a 62 y.o. year old female with a history of HTN, DM, HLD, and prior AKI presenting with nausea and vomiting x3 days and found to have Cr of 2.78 in ED.   1. AKI: patient with recent GI illness with vomiting x3 days and poor PO intake. Cr found to be 2.78 in the ED. -will admit for observation -s/p 1L NS bolus, will bolus 1 additional liter and start on 1/2 NS with KCl 20 mEq @ 125/hr -will hold home lisinopril/HCTZ and metformin -will recheck BMET in AM  2. Nausea/vomiting: likely related to viral gastroenteritis vs gastroparesis given patients poorly controlled DM. Now improving. -zofran and phenergan for nausea control -PO ad lib -if continued nausea will consider starting reglan for potential gastroparesis  3. UTI: evidenced on UA with sm LE and 21-50 WBC on urine micro. Patient with no symptoms. -s/p dose of rocephin -will start on keflex 500 qid  4. HTN: not hypertensive in the ED -will hold home lisinopril/HCTZ given AKI -continue home norvasc 5 mg and clonidine 0.1 mg  5. DM: poorly controlled with last A1c 13.8 -hold home metformin -SSI in hospital -CBGs qac, qhs  6. HLD: will continue home lipitor  7. Hypokalemia: likely related to vomiting vs HCTZ use -will replete with 40 mEq IV and with 20 mEq/L in IVF  FEN/GI: carb modified diet, 1/2 NS with KCl 20 mEq @ 125/hr Prophylaxis: SQ heparin Disposition: admit to floor, discharge pending improvement in renal function  History of Present Illness: [name] is a 62 y.o. year old female with a history of HTN, DM, HLD, and prior AKI presenting with nausea and vomiting x3  days and found to have Cr of 2.78 in ED. Patient states starting 3 days ago had nausea. Started to have vomiitng then as well, 3x/day. Initially had difficulty keeping stuff down, but yesterday was able to drink some. States blood sugars have been well controlled the past several days. Notably she is on lisinopril, HCTZ, and metformin. She denies abdominal pain, diarrhea, current headache, dysuria.  In the ED the patient was given a 1L NS bolus after Cr found to be 2.78, up from 1.26 on 04/02/12. Addtionally noted to have UA with glucose of 500, leg Hgb, >300 protein, sm LE, no nitrites, and 21-50 WBCs on urine micro. Patient was given rocephin x1 in ED. Her vitals were noted to be stable.  Patient Active Problem List  Diagnosis  . DM (diabetes mellitus)  . HTN (hypertension)  . Anemia  . Hyperlipidemia  . RBBB  . LVH (left ventricular hypertrophy)  . Acute kidney injury  . Weakness generalized   Past Medical History: Past Medical History  Diagnosis Date  . Hypertension   . Diabetes mellitus   . Anemia   . Hyperlipidemia   . Left ventricular hypertrophy    Past Surgical History: Past Surgical History  Procedure Laterality Date  . Cholecystectomy     Social History: History  Substance Use Topics  . Smoking status: Former Smoker    Types: Cigarettes    Quit date: 03/19/1966  . Smokeless tobacco: Never Used  . Alcohol Use: No   For any additional social history  documentation, please refer to relevant sections of EMR.  Family History: Family History  Problem Relation Age of Onset  . Diabetes type II    . Hypertension     Allergies: Allergies  Allergen Reactions  . Nitrofurantoin Monohyd Macro Nausea Only   No current facility-administered medications on file prior to encounter.   Current Outpatient Prescriptions on File Prior to Encounter  Medication Sig Dispense Refill  . aspirin EC 81 MG tablet Take 81 mg by mouth every morning.      . pravastatin (PRAVACHOL) 40  MG tablet Take 40 mg by mouth at bedtime.      . [DISCONTINUED] atorvastatin (LIPITOR) 10 MG tablet Take 1 tablet (10 mg total) by mouth daily.  90 tablet  3   Review Of Systems: Per HPI with the following additions: none Otherwise 12 point review of systems was performed and was unremarkable.  Physical Exam: BP 107/76  Pulse 96  Temp(Src) 98.6 F (37 C) (Oral)  Resp 21  SpO2 99% Exam: General: no acute distress, resting comfortably in bed, asking for extra blanket HEENT: NCAT Cardiovascular: rrr, no mrg Respiratory: CTAB, no wheezes or crackles Abdomen: S, NT, ND Extremities: no edema Skin: no lesions Neuro: alert, moving all extremities equally, no focal deficits  Labs and Imaging:  Results for orders placed during the hospital encounter of 05/16/12 (from the past 24 hour(s))  CBC WITH DIFFERENTIAL     Status: None   Collection Time    05/16/12  7:27 PM      Result Value Range   WBC 7.6  4.0 - 10.5 K/uL   RBC 4.79  3.87 - 5.11 MIL/uL   Hemoglobin 13.8  12.0 - 15.0 g/dL   HCT 40.9  81.1 - 91.4 %   MCV 80.0  78.0 - 100.0 fL   MCH 28.8  26.0 - 34.0 pg   MCHC 36.0  30.0 - 36.0 g/dL   RDW 78.2  95.6 - 21.3 %   Platelets 370  150 - 400 K/uL   Neutrophils Relative 64  43 - 77 %   Neutro Abs 4.9  1.7 - 7.7 K/uL   Lymphocytes Relative 24  12 - 46 %   Lymphs Abs 1.8  0.7 - 4.0 K/uL   Monocytes Relative 12  3 - 12 %   Monocytes Absolute 0.9  0.1 - 1.0 K/uL   Eosinophils Relative 0  0 - 5 %   Eosinophils Absolute 0.0  0.0 - 0.7 K/uL   Basophils Relative 0  0 - 1 %   Basophils Absolute 0.0  0.0 - 0.1 K/uL  COMPREHENSIVE METABOLIC PANEL     Status: Abnormal   Collection Time    05/16/12  7:27 PM      Result Value Range   Sodium 134 (*) 135 - 145 mEq/L   Potassium 3.0 (*) 3.5 - 5.1 mEq/L   Chloride 92 (*) 96 - 112 mEq/L   CO2 29  19 - 32 mEq/L   Glucose, Bld 206 (*) 70 - 99 mg/dL   BUN 41 (*) 6 - 23 mg/dL   Creatinine, Ser 0.86 (*) 0.50 - 1.10 mg/dL   Calcium 9.2  8.4 -  57.8 mg/dL   Total Protein 6.8  6.0 - 8.3 g/dL   Albumin 3.0 (*) 3.5 - 5.2 g/dL   AST 21  0 - 37 U/L   ALT 13  0 - 35 U/L   Alkaline Phosphatase 86  39 - 117 U/L  Total Bilirubin 0.3  0.3 - 1.2 mg/dL   GFR calc non Af Amer 17 (*) >90 mL/min   GFR calc Af Amer 20 (*) >90 mL/min  URINALYSIS, ROUTINE W REFLEX MICROSCOPIC     Status: Abnormal   Collection Time    05/16/12  7:31 PM      Result Value Range   Color, Urine YELLOW  YELLOW   APPearance CLOUDY (*) CLEAR   Specific Gravity, Urine 1.023  1.005 - 1.030   pH 6.0  5.0 - 8.0   Glucose, UA 500 (*) NEGATIVE mg/dL   Hgb urine dipstick LARGE (*) NEGATIVE   Bilirubin Urine NEGATIVE  NEGATIVE   Ketones, ur NEGATIVE  NEGATIVE mg/dL   Protein, ur >409 (*) NEGATIVE mg/dL   Urobilinogen, UA 0.2  0.0 - 1.0 mg/dL   Nitrite NEGATIVE  NEGATIVE   Leukocytes, UA SMALL (*) NEGATIVE  URINE MICROSCOPIC-ADD ON     Status: Abnormal   Collection Time    05/16/12  7:31 PM      Result Value Range   Squamous Epithelial / LPF FEW (*) RARE   WBC, UA 21-50  <3 WBC/hpf   RBC / HPF 7-10  <3 RBC/hpf   Bacteria, UA FEW (*) RARE   Casts HYALINE CASTS (*) NEGATIVE  GLUCOSE, CAPILLARY     Status: Abnormal   Collection Time    05/16/12 10:08 PM      Result Value Range   Glucose-Capillary 187 (*) 70 - 99 mg/dL     Marikay Alar, MD 05/17/2012, 1:31 AM

## 2012-05-17 NOTE — Progress Notes (Signed)
Family Medicine Teaching Service Attending Note  I interviewed and examined patient [name] and reviewed their tests and x-rays.  I discussed with Dr. Birdie Sons and reviewed his H&P.  I agree with his assessment and plan.     Additionally  This AM is sleepy but will awaken and is oriented x 3 No discomfort.  No nausea or vomiting this am Await AM labs Continue hydration

## 2012-05-17 NOTE — ED Notes (Signed)
Pt resting awaiting admission. States feeling much better.

## 2012-05-18 LAB — BASIC METABOLIC PANEL
BUN: 36 mg/dL — ABNORMAL HIGH (ref 6–23)
BUN: 32 mg/dL — ABNORMAL HIGH (ref 6–23)
Calcium: 7.7 mg/dL — ABNORMAL LOW (ref 8.4–10.5)
Calcium: 7.9 mg/dL — ABNORMAL LOW (ref 8.4–10.5)
Creatinine, Ser: 2.46 mg/dL — ABNORMAL HIGH (ref 0.50–1.10)
Creatinine, Ser: 2.29 mg/dL — ABNORMAL HIGH (ref 0.50–1.10)
GFR calc Af Amer: 23 mL/min — ABNORMAL LOW (ref >90)
GFR calc Af Amer: 25 mL/min — ABNORMAL LOW (ref >90)
GFR calc non Af Amer: 20 mL/min — ABNORMAL LOW (ref >90)
GFR calc non Af Amer: 21 mL/min — ABNORMAL LOW (ref >90)
Glucose, Bld: 135 mg/dL — ABNORMAL HIGH (ref 70–99)
Potassium: 3.7 mEq/L (ref 3.5–5.1)

## 2012-05-18 LAB — URINE CULTURE

## 2012-05-18 LAB — GLUCOSE, CAPILLARY: Glucose-Capillary: 134 mg/dL — ABNORMAL HIGH (ref 70–99)

## 2012-05-18 MED ORDER — CEPHALEXIN 500 MG PO CAPS: 500.0000 mg | ORAL_CAPSULE | Freq: Four times a day (QID) | ORAL | Status: DC

## 2012-05-18 NOTE — Discharge Summary (Signed)
Physician Discharge Summary  Patient ID: [name] MRN: [id number] DOB: April 20, 1950 Age: 62 y.o.  Admit date: 05/16/2012 Discharge date: 05/18/2012 Admitting Physician: Carney Living, MD  PCP: Corey Harold, MD  Consultants: None  Discharge Diagnosis: Principal Problem:   Acute kidney injury Active Problems:   DM (diabetes mellitus)   HTN (hypertension)   Hyperlipidemia  Hospital Course [name] is a 62 y.o. year old female with a history of HTN, DM, HLD, and prior AKI presenting with nausea and vomiting x3 days and found to have Cr of 2.78 in ED.   1. AKI: patient with recent GI illness with vomiting x3 days and poor PO intake. Cr found to be 2.78 in the ED, up to 3.12. Improved to 2.29 at time of discharge. Baseline appears to be ~1.2. Patient was given 4 1L NS boluses and started on 1/2 NS with KCl 20 mEq @ 125/hr. Fluids were KVO'd prior to discharge and patient demonstrated the ability to take in good PO. Her home lisinopril/HCTZ and metformin were held. Renal US was obtained that revealed medical renal disease.  2. Nausea/vomiting: likely related to viral gastroenteritis vs gastroparesis given patients poorly controlled DM. Had resolved by time of admission. On prn zofran and phenergan.    3. UTI: evidenced on UA with sm LE and 21-50 WBC on urine micro. Patient with no symptoms. Given one dose of rocephin in the ED then started on keflex.   4. HTN: minimally hypertensive during hospitalization to the 140's-150's/80's-90's. Was continued on home norvasc and clonidine. Held home lisinopril/HCTZ given AKI.   5. DM: poorly controlled with last A1c 13.8. CBGs 55-156. SSI in hospital. Stopped home metformin given AKI.  6. HLD: continued home lipitor   7. Hypokalemia: to 3.0. Likely related to vomiting vs HCTZ use. Repleted with 40 mEq IV and with 20 mEq/L in IVF. Resolved at time of discharge.  Problem List 1. AKI 2. UTI 3. Nausea 4. HTN 5. DM 6. HLD 7.  Hypokalemia         Discharge PE   Filed Vitals:   05/18/12 0533  BP: 127/72  Pulse: 63  Temp: 98.4 F (36.9 C)  Resp: 18   General: NAD, laying comfortably in bed  Cardiovascular: rrr, no mrg  Respiratory: CTAB, no wheezes or crackles  Abdomen: s, NT, ND  Extremities: no edema   Procedures/Imaging:  US Renal  05/17/2012  IMPRESSION: Echogenic renal parenchyma, suggesting medical renal disease.  No hydronephrosis.  Thick-walled bladder.   Original Report Authenticated By: Charline Bills, M.D.    Labs  CBC  Recent Labs Lab 05/16/12 1927  WBC 7.6  HGB 13.8  HCT 38.3  PLT 370   BMET  Recent Labs Lab 05/16/12 1927 05/17/12 1111 05/18/12 0650  NA 134* 131* 132*  K 3.0* 3.8 3.7  CL 92* 96 101  CO2 29 25 25   BUN 41* 47* 36*  CREATININE 2.78* 3.12* 2.46*  CALCIUM 9.2 8.0* 7.7*  PROT 6.8  --   --   BILITOT 0.3  --   --   ALKPHOS 86  --   --   ALT 13  --   --   AST 21  --   --   GLUCOSE 206* 68* 135*   Results for orders placed during the hospital encounter of 05/16/12 (from the past 72 hour(s))  CBC WITH DIFFERENTIAL     Status: None   Collection Time    05/16/12  7:27 PM  Result Value Range   WBC 7.6  4.0 - 10.5 K/uL   RBC 4.79  3.87 - 5.11 MIL/uL   Hemoglobin 13.8  12.0 - 15.0 g/dL   HCT 16.1  09.6 - 04.5 %   MCV 80.0  78.0 - 100.0 fL   MCH 28.8  26.0 - 34.0 pg   MCHC 36.0  30.0 - 36.0 g/dL   RDW 40.9  81.1 - 91.4 %   Platelets 370  150 - 400 K/uL   Neutrophils Relative 64  43 - 77 %   Neutro Abs 4.9  1.7 - 7.7 K/uL   Lymphocytes Relative 24  12 - 46 %   Lymphs Abs 1.8  0.7 - 4.0 K/uL   Monocytes Relative 12  3 - 12 %   Monocytes Absolute 0.9  0.1 - 1.0 K/uL   Eosinophils Relative 0  0 - 5 %   Eosinophils Absolute 0.0  0.0 - 0.7 K/uL   Basophils Relative 0  0 - 1 %   Basophils Absolute 0.0  0.0 - 0.1 K/uL  COMPREHENSIVE METABOLIC PANEL     Status: Abnormal   Collection Time    05/16/12  7:27 PM      Result Value Range   Sodium 134 (*)  135 - 145 mEq/L   Potassium 3.0 (*) 3.5 - 5.1 mEq/L   Chloride 92 (*) 96 - 112 mEq/L   CO2 29  19 - 32 mEq/L   Glucose, Bld 206 (*) 70 - 99 mg/dL   BUN 41 (*) 6 - 23 mg/dL   Creatinine, Ser 7.82 (*) 0.50 - 1.10 mg/dL   Calcium 9.2  8.4 - 95.6 mg/dL   Total Protein 6.8  6.0 - 8.3 g/dL   Albumin 3.0 (*) 3.5 - 5.2 g/dL   AST 21  0 - 37 U/L   ALT 13  0 - 35 U/L   Alkaline Phosphatase 86  39 - 117 U/L   Total Bilirubin 0.3  0.3 - 1.2 mg/dL   GFR calc non Af Amer 17 (*) >90 mL/min   GFR calc Af Amer 20 (*) >90 mL/min   Comment:            The eGFR has been calculated     using the CKD EPI equation.     This calculation has not been     validated in all clinical     situations.     eGFR's persistently     <90 mL/min signify     possible Chronic Kidney Disease.  URINALYSIS, ROUTINE W REFLEX MICROSCOPIC     Status: Abnormal   Collection Time    05/16/12  7:31 PM      Result Value Range   Color, Urine YELLOW  YELLOW   APPearance CLOUDY (*) CLEAR   Specific Gravity, Urine 1.023  1.005 - 1.030   pH 6.0  5.0 - 8.0   Glucose, UA 500 (*) NEGATIVE mg/dL   Hgb urine dipstick LARGE (*) NEGATIVE   Bilirubin Urine NEGATIVE  NEGATIVE   Ketones, ur NEGATIVE  NEGATIVE mg/dL   Protein, ur >213 (*) NEGATIVE mg/dL   Urobilinogen, UA 0.2  0.0 - 1.0 mg/dL   Nitrite NEGATIVE  NEGATIVE   Leukocytes, UA SMALL (*) NEGATIVE  URINE MICROSCOPIC-ADD ON     Status: Abnormal   Collection Time    05/16/12  7:31 PM      Result Value Range   Squamous Epithelial / LPF FEW (*) RARE  WBC, UA 21-50  <3 WBC/hpf   RBC / HPF 7-10  <3 RBC/hpf   Bacteria, UA FEW (*) RARE   Casts HYALINE CASTS (*) NEGATIVE   Comment: GRANULAR CAST  GLUCOSE, CAPILLARY     Status: Abnormal   Collection Time    05/16/12 10:08 PM      Result Value Range   Glucose-Capillary 187 (*) 70 - 99 mg/dL  GLUCOSE, CAPILLARY     Status: Abnormal   Collection Time    05/17/12  3:14 AM      Result Value Range   Glucose-Capillary 156 (*) 70  - 99 mg/dL  GLUCOSE, CAPILLARY     Status: Abnormal   Collection Time    05/17/12  7:55 AM      Result Value Range   Glucose-Capillary 156 (*) 70 - 99 mg/dL   Comment 1 Notify RN     Comment 2 Documented in Chart    BASIC METABOLIC PANEL     Status: Abnormal   Collection Time    05/17/12 11:11 AM      Result Value Range   Sodium 131 (*) 135 - 145 mEq/L   Potassium 3.8  3.5 - 5.1 mEq/L   Comment: DELTA CHECK NOTED   Chloride 96  96 - 112 mEq/L   CO2 25  19 - 32 mEq/L   Glucose, Bld 68 (*) 70 - 99 mg/dL   BUN 47 (*) 6 - 23 mg/dL   Creatinine, Ser 1.19 (*) 0.50 - 1.10 mg/dL   Calcium 8.0 (*) 8.4 - 10.5 mg/dL   GFR calc non Af Amer 15 (*) >90 mL/min   GFR calc Af Amer 17 (*) >90 mL/min   Comment:            The eGFR has been calculated     using the CKD EPI equation.     This calculation has not been     validated in all clinical     situations.     eGFR's persistently     <90 mL/min signify     possible Chronic Kidney Disease.  GLUCOSE, CAPILLARY     Status: None   Collection Time    05/17/12 11:52 AM      Result Value Range   Glucose-Capillary 73  70 - 99 mg/dL   Comment 1 Notify RN     Comment 2 Documented in Chart    GLUCOSE, CAPILLARY     Status: Abnormal   Collection Time    05/17/12  5:04 PM      Result Value Range   Glucose-Capillary 143 (*) 70 - 99 mg/dL  GLUCOSE, CAPILLARY     Status: Abnormal   Collection Time    05/17/12  9:07 PM      Result Value Range   Glucose-Capillary 55 (*) 70 - 99 mg/dL  GLUCOSE, CAPILLARY     Status: None   Collection Time    05/17/12  9:35 PM      Result Value Range   Glucose-Capillary 85  70 - 99 mg/dL  BASIC METABOLIC PANEL     Status: Abnormal   Collection Time    05/18/12  6:50 AM      Result Value Range   Sodium 132 (*) 135 - 145 mEq/L   Potassium 3.7  3.5 - 5.1 mEq/L   Chloride 101  96 - 112 mEq/L   CO2 25  19 - 32 mEq/L   Glucose, Bld 135 (*) 70 - 99  mg/dL   BUN 36 (*) 6 - 23 mg/dL   Creatinine, Ser 2.84 (*) 0.50  - 1.10 mg/dL   Calcium 7.7 (*) 8.4 - 10.5 mg/dL   GFR calc non Af Amer 20 (*) >90 mL/min   GFR calc Af Amer 23 (*) >90 mL/min   Comment:            The eGFR has been calculated     using the CKD EPI equation.     This calculation has not been     validated in all clinical     situations.     eGFR's persistently     <90 mL/min signify     possible Chronic Kidney Disease.       Patient condition at time of discharge/disposition: stable  Disposition-home   Follow up issues: 1. F/u renal function, once improved consider starting back home anti-hypertensive medications vs changing home regimen 2. DM uncontrolled, metformin was stopped given renal function, patient with A1c of 13.8 would likely benefit from addition of insulin to regimen, would not start metformin if renal function remains poor  Discharge follow up:  Follow-up Information   Follow up with [facility], [place]. (please call to make a follow-up appointment as soon as possible)    Contact information:   [address] [facility] [address]       Discharge Instructions: Please refer to Patient Instructions section of EMR for full details.  Patient was counseled important signs and symptoms that should prompt return to medical care, changes in medications, dietary instructions, activity restrictions, and follow up appointments.   Discharge Medications   Medication List    STOP taking these medications       lisinopril-hydrochlorothiazide 20-12.5 MG per tablet  Commonly known as:  PRINZIDE,ZESTORETIC     metFORMIN 1000 MG tablet  Commonly known as:  GLUCOPHAGE      TAKE these medications       amLODipine 5 MG tablet  Commonly known as:  NORVASC  Take 5 mg by mouth every morning.     aspirin EC 81 MG tablet  Take 81 mg by mouth every morning.     atorvastatin 10 MG tablet  Commonly known as:  LIPITOR  Take 10 mg by mouth every morning.     cephALEXin 500 MG capsule  Commonly known as:  KEFLEX  Take 1  capsule (500 mg total) by mouth every 6 (six) hours.     cloNIDine 0.1 MG tablet  Commonly known as:  CATAPRES  Take 0.1 mg by mouth 2 (two) times daily.     glimepiride 4 MG tablet  Commonly known as:  AMARYL  Take 4 mg by mouth 2 (two) times daily with a meal.     pravastatin 40 MG tablet  Commonly known as:  PRAVACHOL  Take 40 mg by mouth at bedtime.       Marikay Alar, MD of [facility] 05/18/2012 10:01 PM

## 2012-05-18 NOTE — Progress Notes (Signed)
Family Medicine Teaching Service Attending Note  I interviewed and examined patient [name] and reviewed their tests and x-rays.  I discussed with Dr. Birdie Sons and reviewed their note for today.  I agree with their assessment and plan.     Additionally  Feeling improved no nausea or vomiting  DC IVF ensure her electrolytes and crt are stable if so discharge with close follow up for care of her chronic conditions

## 2012-05-18 NOTE — Progress Notes (Signed)
Family Medicine Teaching Service Daily Progress Note Service Page: [phone]  Subjective: doing well. No complaints this morning. Denies nausea and vomiting.  Objective: Temp:  [97.9 F (36.6 C)-98.6 F (37 C)] 98.4 F (36.9 C) (03/02 0533) Pulse Rate:  [63-98] 63 (03/02 0533) Resp:  [18-20] 18 (03/02 0533) BP: (119-155)/(68-84) 127/72 mmHg (03/02 0533) SpO2:  [96 %-100 %] 100 % (03/02 0533) Weight:  [120 lb 6.4 oz (54.613 kg)] 120 lb 6.4 oz (54.613 kg) (03/01 2103) Exam: General: NAD, laying comfortably in bed Cardiovascular: rrr, no mrg Respiratory: CTAB, no wheezes or crackles Abdomen: s, NT, ND Extremities: no edema  CBC BMET   Recent Labs Lab 05/16/12 1927  WBC 7.6  HGB 13.8  HCT 38.3  PLT 370    Recent Labs Lab 05/16/12 1927 05/17/12 1111 05/18/12 0650  NA 134* 131* 132*  K 3.0* 3.8 3.7  CL 92* 96 101  CO2 29 25 25   BUN 41* 47* 36*  CREATININE 2.78* 3.12* 2.46*  GLUCOSE 206* 68* 135*  CALCIUM 9.2 8.0* 7.7*     CBG 55-156  US Renal  05/17/2012  IMPRESSION: Echogenic renal parenchyma, suggesting medical renal disease.  No hydronephrosis.  Thick-walled bladder.   Original Report Authenticated By: Charline Bills, M.D.    Assessment/Plan: [name] is a 62 y.o. year old female with a history of HTN, DM, HLD, and prior AKI presenting with nausea and vomiting x3 days and found to have Cr of 2.78 in ED.   1. AKI: patient with recent GI illness with vomiting x3 days and poor PO intake. Cr found to be 2.78 in the ED, up to 3.12, no improved to 2.46. Baseline appears to be ~1.2. -s/p 4 1L NS boluses, on 1/2 NS with KCl 20 mEq @ 125/hr-will kvo this and have her drink PO fluids  -will hold home lisinopril/HCTZ and metformin  -will recheck BMET this afternoon, if improved will d/c -renal US per above   2. Nausea/vomiting: likely related to viral gastroenteritis vs gastroparesis given patients poorly controlled DM. Now improving.  -zofran and phenergan  for nausea control  -PO ad lib  -resolved   3. UTI: evidenced on UA with sm LE and 21-50 WBC on urine micro. Patient with no symptoms.  -s/p dose of rocephin  -will start on keflex 500 qid   4. HTN: not hypertensive in the ED  -will hold home lisinopril/HCTZ given AKI  -continue home norvasc 5 mg and clonidine 0.1 mg   5. DM: poorly controlled with last A1c 13.8. CBGs 55-156. -hold home metformin  -SSI in hospital  -CBGs qac, qhs   6. HLD: will continue home lipitor   7. Hypokalemia: likely related to vomiting vs HCTZ use-resolved  -repleted with 40 mEq IV and with 20 mEq/L in IVF   FEN/GI: carb modified diet, 1/2 NS with KCl 20 mEq @ 125/hr  Prophylaxis: SQ heparin  Disposition: discharge likely this afternoon if Cr improving   Marikay Alar, MD 05/18/2012, 7:19 AM

## 2012-05-18 NOTE — H&P (Signed)
Family Medicine Teaching Service Attending Note  On 05-17-12 I interviewed and examined patient Hammar and reviewed their tests and x-rays.  I discussed with Dr. Birdie Sons and reviewed their note for today.  I agree with their assessment and plan.

## 2012-05-19 LAB — GLUCOSE, CAPILLARY: Glucose-Capillary: 95 mg/dL (ref 70–99)

## 2012-05-19 NOTE — Discharge Summary (Signed)
I have reviewed this discharge summary and agree.    

## 2012-05-20 NOTE — Progress Notes (Signed)
Patient called stated that she was discharged over the weekend and on her paperwork was told to follow up with a Dr. Elesa Massed. The paperwork has an address but no phone number. I attempted to look the MD up on provider finder but still no phone number available. Paged and spoke with internal medicine resident who said if patient does not have a PCP to have her call [phone] to schedule a follow up visit. Called patient back and informed her of this.

## 2012-05-21 ENCOUNTER — Ambulatory Visit (INDEPENDENT_AMBULATORY_CARE_PROVIDER_SITE_OTHER): Payer: 59 | Admitting: Emergency Medicine

## 2012-05-21 VITALS — BP 152/92 | HR 86 | Temp 98.2°F | Resp 17 | Ht 62.5 in | Wt 115.0 lb

## 2012-05-21 DIAGNOSIS — D649 Anemia, unspecified: Secondary | ICD-10-CM

## 2012-05-21 DIAGNOSIS — E871 Hypo-osmolality and hyponatremia: Secondary | ICD-10-CM

## 2012-05-21 DIAGNOSIS — E876 Hypokalemia: Secondary | ICD-10-CM

## 2012-05-21 LAB — BASIC METABOLIC PANEL
BUN: 18 mg/dL (ref 6–23)
CO2: 29 mEq/L (ref 19–32)
Calcium: 9.5 mg/dL (ref 8.4–10.5)
Glucose, Bld: 144 mg/dL — ABNORMAL HIGH (ref 70–99)
Potassium: 3.7 mEq/L (ref 3.5–5.3)
Sodium: 137 mEq/L (ref 135–145)

## 2012-05-21 LAB — POCT CBC
HCT, POC: 35.3 % — AB (ref 37.7–47.9)
Hemoglobin: 11.2 g/dL — AB (ref 12.2–16.2)
Lymph, poc: 2.3 (ref 0.6–3.4)
MCHC: 31.7 g/dL — AB (ref 31.8–35.4)
MCV: 85.9 fL (ref 80–97)
POC Granulocyte: 1.7 — AB (ref 2–6.9)
POC LYMPH PERCENT: 52 %L — AB (ref 10–50)
RDW, POC: 14.7 %

## 2012-05-21 NOTE — Progress Notes (Signed)
  Subjective:    Patient ID: [name], female    DOB: 09/21/1950, 62 y.o.   MRN: [id number]  HPI In hospital through Sunday night due to virus, vomiting, no diarrhea.  Blood sugar doing well, eating and drinking ok for now. Out of work this week, needs a note to be released  to return to work. 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 Patient had acute kidney injury associated with her diabetes but generalized weakness and required IV fluids. She is now feeling significantly better but has a fairly strenuous job where she has to assist students on-off of the school bus   Review of Systems     Objective:   Physical Exam patient is alert and cooperative she is not in any distress. Her neck is supple. Her chest is clear to auscultation and percussion. Her cardiac exam reveals a regular rate without murmurs. Her abdomen is soft without tenderness  Results for orders placed in visit on 05/21/12  POCT CBC      Result Value Range   WBC 4.5 (*) 4.6 - 10.2 K/uL   Lymph, poc 2.3  0.6 - 3.4   POC LYMPH PERCENT 52.0 (*) 10 - 50 %L   MID (cbc) 0.5  0 - 0.9   POC MID % 10.3  0 - 12 %M   POC Granulocyte 1.7 (*) 2 - 6.9   Granulocyte percent 37.7  37 - 80 %G   RBC 4.11  4.04 - 5.48 M/uL   Hemoglobin 11.2 (*) 12.2 - 16.2 g/dL   HCT, POC 30.8 (*) 65.7 - 47.9 %   MCV 85.9  80 - 97 fL   MCH, POC 27.3  27 - 31.2 pg   MCHC 31.7 (*) 31.8 - 35.4 g/dL   RDW, POC 84.6     Platelet Count, POC 318  142 - 424 K/uL   MPV 9.0  0 - 99.8 fL  GLUCOSE, POCT (MANUAL RESULT ENTRY)      Result Value Range   POC Glucose 140 (*) 70 - 99 mg/dl        Assessment & Plan:  Patient will return to work and not one week. She should drink fluids and he can increase her activity level. I did check a be met today to make sure her potassium and sodium had returned to normal.

## 2012-11-05 ENCOUNTER — Ambulatory Visit (INDEPENDENT_AMBULATORY_CARE_PROVIDER_SITE_OTHER): Payer: 59 | Admitting: Family Medicine

## 2012-11-05 VITALS — BP 182/112 | HR 89 | Temp 98.0°F | Resp 17 | Ht 62.0 in | Wt 129.0 lb

## 2012-11-05 DIAGNOSIS — I10 Essential (primary) hypertension: Secondary | ICD-10-CM

## 2012-11-05 DIAGNOSIS — Z9119 Patient's noncompliance with other medical treatment and regimen: Secondary | ICD-10-CM

## 2012-11-05 DIAGNOSIS — E7800 Pure hypercholesterolemia, unspecified: Secondary | ICD-10-CM

## 2012-11-05 DIAGNOSIS — E785 Hyperlipidemia, unspecified: Secondary | ICD-10-CM

## 2012-11-05 DIAGNOSIS — IMO0001 Reserved for inherently not codable concepts without codable children: Secondary | ICD-10-CM

## 2012-11-05 DIAGNOSIS — N289 Disorder of kidney and ureter, unspecified: Secondary | ICD-10-CM

## 2012-11-05 DIAGNOSIS — Z91199 Patient's noncompliance with other medical treatment and regimen due to unspecified reason: Secondary | ICD-10-CM

## 2012-11-05 DIAGNOSIS — D649 Anemia, unspecified: Secondary | ICD-10-CM

## 2012-11-05 LAB — POCT CBC
Granulocyte percent: 44.8 %G (ref 37–80)
Hemoglobin: 11.7 g/dL — AB (ref 12.2–16.2)
MCH, POC: 28.1 pg (ref 27–31.2)
MID (cbc): 0.4 (ref 0–0.9)
MPV: 8.2 fL (ref 0–99.8)
POC MID %: 9.6 %M (ref 0–12)
Platelet Count, POC: 263 10*3/uL (ref 142–424)
RBC: 4.17 M/uL (ref 4.04–5.48)
WBC: 4.2 10*3/uL — AB (ref 4.6–10.2)

## 2012-11-05 LAB — LIPID PANEL
Cholesterol: 263 mg/dL — ABNORMAL HIGH (ref 0–200)
HDL: 78 mg/dL (ref >39)
Total CHOL/HDL Ratio: 3.4 Ratio
Triglycerides: 90 mg/dL (ref <150)

## 2012-11-05 LAB — COMPREHENSIVE METABOLIC PANEL
AST: 17 U/L (ref 0–37)
Albumin: 3.8 g/dL (ref 3.5–5.2)
Alkaline Phosphatase: 79 U/L (ref 39–117)
BUN: 23 mg/dL (ref 6–23)
Calcium: 9.8 mg/dL (ref 8.4–10.5)
Chloride: 104 mEq/L (ref 96–112)
Glucose, Bld: 209 mg/dL — ABNORMAL HIGH (ref 70–99)
Potassium: 4.2 mEq/L (ref 3.5–5.3)
Sodium: 138 mEq/L (ref 135–145)
Total Protein: 7 g/dL (ref 6.0–8.3)

## 2012-11-05 MED ORDER — GLIMEPIRIDE 4 MG PO TABS: 4.0000 mg | ORAL_TABLET | Freq: Every day | ORAL | Status: DC

## 2012-11-05 MED ORDER — PRAVASTATIN SODIUM 40 MG PO TABS: 40.0000 mg | ORAL_TABLET | Freq: Every day | ORAL | Status: DC

## 2012-11-05 MED ORDER — AMLODIPINE BESYLATE 5 MG PO TABS: 5.0000 mg | ORAL_TABLET | Freq: Every morning | ORAL | Status: DC

## 2012-11-05 MED ORDER — CLONIDINE HCL 0.1 MG PO TABS: 0.1000 mg | ORAL_TABLET | Freq: Two times a day (BID) | ORAL | Status: DC

## 2012-11-05 NOTE — Patient Instructions (Addendum)
Restart the norvasc and clonidine (blood pressure meds) as taking prior. Keep a record of your blood pressures outside of the office and bring them to the next office visit. recehck in the next 1-2 weeks at the most to recheck blood pressure readings on medicine. Return to the clinic or go to the nearest emergency room if any of your symptoms worsen or new symptoms occur. You should receive a call or letter about your lab results within the next week to 10 days for cholesterol and kidney function. Restart pravachol - can try taking in the morning if you feel like his medicine kept you awake.   Your diabetes numbers are better today.  Ok to restart the Amaryl but once per day WITH MEAL, check your blood sugar readings twice per day and return with these readings in the next 2 weeks. If any low blood sugar symptoms as noted below - take glucose/sugar as instructed and return to clinic, go to emergency room or call 911 if needed.   Hypoglycemia (Low Blood Sugar) Hypoglycemia is when the glucose (sugar) in your blood is too low. Hypoglycemia can happen for many reasons. It can happen to people with or without diabetes. Hypoglycemia can develop quickly and can be a medical emergency.  CAUSES  Having hypoglycemia does not mean that you will develop diabetes. Different causes include:  Missed or delayed meals or not enough carbohydrates eaten.  Medication overdose. This could be by accident or deliberate. If by accident, your medication may need to be adjusted or changed.  Exercise or increased activity without adjustments in carbohydrates or medications.  A nerve disorder that affects body functions like your heart rate, blood pressure and digestion (autonomic neuropathy).  A condition where the stomach muscles do not function properly (gastroparesis). Therefore, medications may not absorb properly.  The inability to recognize the signs of hypoglycemia (hypoglycemic unawareness).  Absorption of  insulin  may be altered.  Alcohol consumption.  Pregnancy/menstrual cycles/postpartum. This may be due to hormones.  Certain kinds of tumors. This is very rare. SYMPTOMS   Sweating.  Hunger.  Dizziness.  Blurred vision.  Drowsiness.  Weakness.  Headache.  Rapid heart beat.  Shakiness.  Nervousness. DIAGNOSIS  Diagnosis is made by monitoring blood glucose in one or all of the following ways:  Fingerstick blood glucose monitoring.  Laboratory results. TREATMENT  If you think your blood glucose is low:  Check your blood glucose, if possible. If it is less than 70 mg/dl, take one of the following:  3-4 glucose tablets.   cup juice (prefer clear like apple).   cup "regular" soda pop.  1 cup milk.  -1 tube of glucose gel.  5-6 hard candies.  Do not over treat because your blood glucose (sugar) will only go too high.  Wait 15 minutes and recheck your blood glucose. If it is still less than 70 mg/dl (or below your target range), repeat treatment.  Eat a snack if it is more than one hour until your next meal. Sometimes, your blood glucose may go so low that you are unable to treat yourself. You may need someone to help you. You may even pass out or be unable to swallow. This may require you to get an injection of glucagon, which raises the blood glucose. HOME CARE INSTRUCTIONS  Check blood glucose as recommended by your caregiver.  Take medication as prescribed by your caregiver.  Follow your meal plan. Do not skip meals. Eat on time.  If you are  going to drink alcohol, drink it only with meals.  Check your blood glucose before driving.  Check your blood glucose before and after exercise. If you exercise longer or different than usual, be sure to check blood glucose more frequently.  Always carry treatment with you. Glucose tablets are the easiest to carry.  Always wear medical alert jewelry or carry some form of identification that states that you  have diabetes. This will alert people that you have diabetes. If you have hypoglycemia, they will have a better idea on what to do. SEEK MEDICAL CARE IF:   You are having problems keeping your blood sugar at target range.  You are having frequent episodes of hypoglycemia.  You feel you might be having side effects from your medicines.  You have symptoms of an illness that is not improving after 3-4 days.  You notice a change in vision or a new problem with your vision. SEEK IMMEDIATE MEDICAL CARE IF:   You are a family member or friend of a person whose blood glucose goes below 70 mg/dl and is accompanied by:  Confusion.  A change in mental status.  The inability to swallow.  Passing out. Document Released: 03/05/2005 Document Revised: 05/28/2011 Document Reviewed: 07/02/2011 [facility] Patient Information 2014 [place], [place].

## 2012-11-05 NOTE — Progress Notes (Signed)
Subjective:    Patient ID: [name], female    DOB: Oct 07, 1950, 62 y.o.   MRN: [id number]  HPI [name] is a 62 y.o. female Hx of Dm2, HTN, hyperlipidemia. Last seen 05/21/12 for hospital follow up. Admitted 2/28-05/18/12.  Had GI illness then with ARF - max creatinine 3.12, uncontrolled diabetes with A1c 13.8 in January (metformin held in hospital as in ARF).  Prior routine OV for med refills with Dr. Conley Rolls in January 2014- restarted meds as hx of poorly controlled DM2 and HTN at that time with plan of repeat ov in 1 week. Did not follow up except after hospitalization.   Here for medicine refills. Has not setup primary provider since [place] left our office,  Has not had follow up since 05/21/12.   HTN - off meds for at least 2 months when seen 03/2012. Now off medicine again past month. No side effects with meds, just has hadn't gotten around to being seen. Denies cost concerns or other barriers.  No chest pain, no focal weakness, no slurred speech, no headache/dizziness/lightheadedness. Urinating normally. No abd pain.  Prior on clonidine 0.1mg  BID, norvasc 5mg  qd. Does not check home blood pressures.   Hyperlipidemia - prior on pravachol - off for few months. Fasting this am. Took pravachol at night - took at night  - felt like hard to go to sleep with taking this medicine. No myalgias with this.   Dm2 - last Aic 13.8 in January. Taking metformin every now and then - about once a week, also on Amaryl twice per day prior - off this for 3 weeks, but states took this twice per day. Occasional missed dose - about every 2 weeks. Has meter at home, but has not checked blood sugars in awhile. Possible one instance of low blood sugar in March, but not recently.   Renal insufficiency - medical renal disease noted on renal ultrasound during hospitalization in 04/2012. Max creat then 3.12, down to 1.95 at 05/21/12 follow up. Has not seen nephrologist. No NSAIDs.   Anemia - Hgb 11.2 at 05/21/12 ov -  referred to GI by Dr. Cleta Alberts. Planning on colonoscopy - has to reschedule due to work - needs number for Dr. Loreta Ave.   Past Medical History  Diagnosis Date  . Hypertension   . Diabetes mellitus   . Anemia   . Hyperlipidemia   . Left ventricular hypertrophy    Patient Active Problem List   Diagnosis Date Noted  . DM (diabetes mellitus) 04/18/2011  . HTN (hypertension) 04/18/2011  . Anemia 04/18/2011  . Hyperlipidemia 04/18/2011  . RBBB 04/18/2011  . LVH (left ventricular hypertrophy) 04/18/2011  . Acute kidney injury 04/18/2011  . Weakness generalized 04/18/2011   Past Surgical History  Procedure Laterality Date  . Cholecystectomy     Allergies  Allergen Reactions  . Nitrofurantoin Monohyd Macro Nausea Only   Prior to Admission medications   Medication Sig Start Date End Date Taking? Authorizing Provider  amLODipine (NORVASC) 5 MG tablet Take 5 mg by mouth every morning. 04/02/12  Yes Thao P Le, DO  aspirin EC 81 MG tablet Take 81 mg by mouth every morning.   Yes Historical Provider, MD  cephALEXin (KEFLEX) 500 MG capsule Take 1 capsule (500 mg total) by mouth every 6 (six) hours. 05/18/12  Yes Glori Luis, MD  cloNIDine (CATAPRES) 0.1 MG tablet Take 0.1 mg by mouth 2 (two) times daily. 04/02/12  Yes Thao P Le, DO  glimepiride (AMARYL)  4 MG tablet Take 4 mg by mouth 2 (two) times daily with a meal. 04/02/12  Yes Thao P Le, DO  pravastatin (PRAVACHOL) 40 MG tablet Take 40 mg by mouth at bedtime.   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  Quit smoking in 1986. No alcohol.   Review of Systems  Constitutional: Negative for fatigue and unexpected weight change.  Respiratory: Negative for chest tightness and shortness of breath.   Cardiovascular: Negative for chest pain, palpitations and leg swelling.  Gastrointestinal: Negative for abdominal pain.  Musculoskeletal: Negative for myalgias.  Skin: Negative for rash.   Neurological: Negative for dizziness, syncope, weakness, light-headedness and headaches.       Objective:   Physical Exam  Vitals reviewed. Constitutional: She is oriented to person, place, and time. She appears well-developed and well-nourished.  HENT:  Head: Normocephalic and atraumatic.  Mouth/Throat: Oropharynx is clear and moist.  Eyes: Conjunctivae and EOM are normal. Pupils are equal, round, and reactive to light.  Neck: Normal range of motion. No thyromegaly present.  Cardiovascular: Normal rate, regular rhythm, normal heart sounds and intact distal pulses.   No murmur heard. Pulmonary/Chest: Effort normal. She has no wheezes. She has no rales.  Abdominal: Soft. Normal appearance. She exhibits no abdominal bruit and no pulsatile midline mass. There is no tenderness.  Neurological: She is alert and oriented to person, place, and time.  Microfilament testing WNL bilat feet  Skin: Skin is warm and dry.  Psychiatric: She has a normal mood and affect. Her behavior is normal.     Results for orders placed in visit on 11/05/12  POCT GLYCOSYLATED HEMOGLOBIN (HGB A1C)      Result Value Range   Hemoglobin A1C 7.9    GLUCOSE, POCT (MANUAL RESULT ENTRY)      Result Value Range   POC Glucose 198 (*) 70 - 99 mg/dl  POCT CBC      Result Value Range   WBC 4.2 (*) 4.6 - 10.2 K/uL   Lymph, poc 1.9  0.6 - 3.4   POC LYMPH PERCENT 45.6  10 - 50 %L   MID (cbc) 0.4  0 - 0.9   POC MID % 9.6  0 - 12 %M   POC Granulocyte 1.9 (*) 2 - 6.9   Granulocyte percent 44.8  37 - 80 %G   RBC 4.17  4.04 - 5.48 M/uL   Hemoglobin 11.7 (*) 12.2 - 16.2 g/dL   HCT, POC 16.1 (*) 09.6 - 47.9 %   MCV 88.8  80 - 97 fL   MCH, POC 28.1  27 - 31.2 pg   MCHC 31.6 (*) 31.8 - 35.4 g/dL   RDW, POC 04.5     Platelet Count, POC 263  142 - 424 K/uL   MPV 8.2  0 - 99.8 fL        Assessment & Plan:  [name] is a 62 y.o. female Type II or unspecified type diabetes mellitus without mention of  complication, uncontrolled - Plan: POCT glycosylated hemoglobin (Hb A1C), POCT glucose (manual entry)  Essential hypertension, benign - Plan: Comprehensive metabolic panel, POCT CBC  Pure hypercholesterolemia - Plan: CANCELED: Lipid panel  Other and unspecified hyperlipidemia - Plan: Lipid panel  Renal insufficiency  Anemia  History of nonadherence to medical treatment   Hyperlipidemia - med nonadherent. Check lipids today. Restart pravachol - can try in am.  rtc to discuss if intolerant  or new side effects.   Anemia - recheck today stable. Plans on rescheduling colonoscopy - number provided for GI for her to call and reschedule.    DM2 - uncontrolled, med nonadherent in past.  Improved A1c today. Check home cbg's, and return in next 2 weeks with readings. Can restart Amaryl at 4mg , but with above A1c and off meds recently - will restart at QD dosing with meal, but hypoglycemic precautions reviewed and h/o given.   HTN - uncontrolled, med nonadherent. Asymptomatic currently - restart prior regemin, check home bp's and recheck in next 1- 2 weeks. ER/911 precautions.    Renal insufficiency - recheck BMP.  Continue to hold metformin until values known. May need nephrology eval. Avoid NSAIDS.   Med nonadherence - discussed risks, including but not limited to worsening renal disease, dialysis, MI, CVA or peripheral vasc disease with above conditions and importance of medication adherence to lessen chance of these occurring.  Attempted to identfy barriers form patient, but none identified at present. Will attempt to obtain appt with new primary provider here, but can follow up in 2 weeks at 102 as above.    Meds ordered this encounter  Medications  . pravastatin (PRAVACHOL) 40 MG tablet    Sig: Take 1 tablet (40 mg total) by mouth daily.    Dispense:  30 tablet    Refill:  2  . glimepiride (AMARYL) 4 MG tablet    Sig: Take 1 tablet (4 mg total) by mouth daily before breakfast.     Dispense:  30 tablet    Refill:  2  . cloNIDine (CATAPRES) 0.1 MG tablet    Sig: Take 1 tablet (0.1 mg total) by mouth 2 (two) times daily.    Dispense:  60 tablet    Refill:  2  . amLODipine (NORVASC) 5 MG tablet    Sig: Take 1 tablet (5 mg total) by mouth every morning.    Dispense:  30 tablet    Refill:  2   Patient Instructions  Restart the norvasc and clonidine (blood pressure meds) as taking prior. Keep a record of your blood pressures outside of the office and bring them to the next office visit. recehck in the next 1-2 weeks at the most to recheck blood pressure readings on medicine. Return to the clinic or go to the nearest emergency room if any of your symptoms worsen or new symptoms occur. You should receive a call or letter about your lab results within the next week to 10 days for cholesterol and kidney function. Restart pravachol - can try taking in the morning if you feel like his medicine kept you awake.   Your diabetes numbers are better today.  Ok to restart the Amaryl but once per day WITH MEAL, check your blood sugar readings twice per day and return with these readings in the next 2 weeks. If any low blood sugar symptoms as noted below - take glucose/sugar as instructed and return to clinic, go to emergency room or call 911 if needed.   Hypoglycemia (Low Blood Sugar) Hypoglycemia is when the glucose (sugar) in your blood is too low. Hypoglycemia can happen for many reasons. It can happen to people with or without diabetes. Hypoglycemia can develop quickly and can be a medical emergency.  CAUSES  Having hypoglycemia does not mean that you will develop diabetes. Different causes include:  Missed or delayed meals or not enough carbohydrates eaten.  Medication overdose. This could be by accident or deliberate.  If by accident, your medication may need to be adjusted or changed.  Exercise or increased activity without adjustments in carbohydrates or medications.  A nerve  disorder that affects body functions like your heart rate, blood pressure and digestion (autonomic neuropathy).  A condition where the stomach muscles do not function properly (gastroparesis). Therefore, medications may not absorb properly.  The inability to recognize the signs of hypoglycemia (hypoglycemic unawareness).  Absorption of insulin  may be altered.  Alcohol consumption.  Pregnancy/menstrual cycles/postpartum. This may be due to hormones.  Certain kinds of tumors. This is very rare. SYMPTOMS   Sweating.  Hunger.  Dizziness.  Blurred vision.  Drowsiness.  Weakness.  Headache.  Rapid heart beat.  Shakiness.  Nervousness. DIAGNOSIS  Diagnosis is made by monitoring blood glucose in one or all of the following ways:  Fingerstick blood glucose monitoring.  Laboratory results. TREATMENT  If you think your blood glucose is low:  Check your blood glucose, if possible. If it is less than 70 mg/dl, take one of the following:  3-4 glucose tablets.   cup juice (prefer clear like apple).   cup "regular" soda pop.  1 cup milk.  -1 tube of glucose gel.  5-6 hard candies.  Do not over treat because your blood glucose (sugar) will only go too high.  Wait 15 minutes and recheck your blood glucose. If it is still less than 70 mg/dl (or below your target range), repeat treatment.  Eat a snack if it is more than one hour until your next meal. Sometimes, your blood glucose may go so low that you are unable to treat yourself. You may need someone to help you. You may even pass out or be unable to swallow. This may require you to get an injection of glucagon, which raises the blood glucose. HOME CARE INSTRUCTIONS  Check blood glucose as recommended by your caregiver.  Take medication as prescribed by your caregiver.  Follow your meal plan. Do not skip meals. Eat on time.  If you are going to drink alcohol, drink it only with meals.  Check your blood  glucose before driving.  Check your blood glucose before and after exercise. If you exercise longer or different than usual, be sure to check blood glucose more frequently.  Always carry treatment with you. Glucose tablets are the easiest to carry.  Always wear medical alert jewelry or carry some form of identification that states that you have diabetes. This will alert people that you have diabetes. If you have hypoglycemia, they will have a better idea on what to do. SEEK MEDICAL CARE IF:   You are having problems keeping your blood sugar at target range.  You are having frequent episodes of hypoglycemia.  You feel you might be having side effects from your medicines.  You have symptoms of an illness that is not improving after 3-4 days.  You notice a change in vision or a new problem with your vision. SEEK IMMEDIATE MEDICAL CARE IF:   You are a family member or friend of a person whose blood glucose goes below 70 mg/dl and is accompanied by:  Confusion.  A change in mental status.  The inability to swallow.  Passing out. Document Released: 03/05/2005 Document Revised: 05/28/2011 Document Reviewed: 07/02/2011 [facility] Patient Information 2014 [place], [place].

## 2012-11-06 ENCOUNTER — Telehealth: Payer: Self-pay

## 2012-11-06 NOTE — Telephone Encounter (Signed)
Patient was here yesterday. Would like to speak with Dr.Greene,states it is emergent. Call back at earliest convenience [phone].

## 2012-11-07 NOTE — Telephone Encounter (Signed)
Called patient at home number - no answer. Left message will try other number or try to reach her tomorrow.  Called cell [phone] on contact info): left message to call back and provide more info if possible or can try to reach her tomorrow.

## 2012-11-07 NOTE — Telephone Encounter (Signed)
Called patient to find out if there was something we could help her with since Dr Neva Seat is not in the office.  She said it's personal and she doesn't want to discuss with anyone other than Dr Neva Seat.  Verified with patient that she is not in medical distress.  No, she would just like to speak with Dr Neva Seat tomorrow when he is back in the office.

## 2012-11-11 ENCOUNTER — Telehealth: Payer: Self-pay

## 2012-11-11 DIAGNOSIS — E119 Type 2 diabetes mellitus without complications: Secondary | ICD-10-CM

## 2012-11-11 NOTE — Telephone Encounter (Signed)
Patient has indicated she will not speak to anyone else other than Dr Neva Seat. She would not give any additional information

## 2012-11-11 NOTE — Telephone Encounter (Signed)
PT STATES DR Neva Seat HAD CALLED HER LAST WEEK AND SHE MISSED THE CALL, ONLY WANT DR Neva Seat TO CALL HER BACK AT [phone] DOESN'T WANT ANYONE ELSE BUT HIM

## 2012-11-11 NOTE — Progress Notes (Signed)
Left msg for pt to schedule follow up appt.

## 2012-11-12 NOTE — Telephone Encounter (Signed)
Called patient again at provided number. Left message on VM to call back with more information, or if only wants to speak with me - what is a better time when I may reach her?

## 2012-11-14 NOTE — Telephone Encounter (Signed)
Called pt, she states the best time to call is 1 pm today at [phone]

## 2012-11-17 MED ORDER — GLUCOSE BLOOD VI STRP: ORAL_STRIP | Status: DC

## 2012-11-17 MED ORDER — ONETOUCH ULTRASOFT LANCETS MISC: Status: DC

## 2012-11-17 NOTE — Telephone Encounter (Signed)
Called pt - just had questions about which meds were Rx at last ov.  Discussed with her. Also needs new meter to check home blood sugars. Sample of Verio IQ by one Touch given from sample closet - ready for pick up. Rx for strips and lancets given. Plan on recheck in next few weeks.

## 2012-11-19 NOTE — Progress Notes (Signed)
Sent pt reminder letter to schedule follow up appointment.

## 2012-12-17 ENCOUNTER — Telehealth: Payer: Self-pay

## 2012-12-17 DIAGNOSIS — E785 Hyperlipidemia, unspecified: Secondary | ICD-10-CM

## 2012-12-17 MED ORDER — GLIMEPIRIDE 4 MG PO TABS: 4.0000 mg | ORAL_TABLET | Freq: Every day | ORAL | Status: DC

## 2012-12-17 MED ORDER — PRAVASTATIN SODIUM 40 MG PO TABS: 40.0000 mg | ORAL_TABLET | Freq: Every day | ORAL | Status: DC

## 2012-12-17 NOTE — Telephone Encounter (Signed)
Pt needs cholesterol and diabetic rx refill.  Will use the [facility] at [address]. Call at [phone]

## 2012-12-17 NOTE — Telephone Encounter (Signed)
Sent in, she had refills remaining, but [facility] did not show the refills.

## 2013-08-16 ENCOUNTER — Emergency Department (HOSPITAL_COMMUNITY): Payer: Medicare HMO

## 2013-08-16 ENCOUNTER — Inpatient Hospital Stay (HOSPITAL_COMMUNITY)
Admission: EM | Admit: 2013-08-16 | Discharge: 2013-08-20 | DRG: 292 | Disposition: A | Discharge status: Home / Self Care | Payer: Medicare HMO | Attending: Internal Medicine | Admitting: Internal Medicine

## 2013-08-16 ENCOUNTER — Encounter (HOSPITAL_COMMUNITY): Payer: Self-pay | Admitting: Emergency Medicine

## 2013-08-16 DIAGNOSIS — E1122 Type 2 diabetes mellitus with diabetic chronic kidney disease: Secondary | ICD-10-CM | POA: Diagnosis present

## 2013-08-16 DIAGNOSIS — I509 Heart failure, unspecified: Secondary | ICD-10-CM

## 2013-08-16 DIAGNOSIS — N039 Chronic nephritic syndrome with unspecified morphologic changes: Secondary | ICD-10-CM

## 2013-08-16 DIAGNOSIS — R799 Abnormal finding of blood chemistry, unspecified: Secondary | ICD-10-CM | POA: Diagnosis present

## 2013-08-16 DIAGNOSIS — R079 Chest pain, unspecified: Secondary | ICD-10-CM

## 2013-08-16 DIAGNOSIS — I10 Essential (primary) hypertension: Secondary | ICD-10-CM

## 2013-08-16 DIAGNOSIS — R748 Abnormal levels of other serum enzymes: Secondary | ICD-10-CM | POA: Diagnosis present

## 2013-08-16 DIAGNOSIS — N184 Chronic kidney disease, stage 4 (severe): Secondary | ICD-10-CM

## 2013-08-16 DIAGNOSIS — R7989 Other specified abnormal findings of blood chemistry: Secondary | ICD-10-CM

## 2013-08-16 DIAGNOSIS — E119 Type 2 diabetes mellitus without complications: Secondary | ICD-10-CM

## 2013-08-16 DIAGNOSIS — I428 Other cardiomyopathies: Secondary | ICD-10-CM | POA: Diagnosis present

## 2013-08-16 DIAGNOSIS — I4510 Unspecified right bundle-branch block: Secondary | ICD-10-CM

## 2013-08-16 DIAGNOSIS — D631 Anemia in chronic kidney disease: Secondary | ICD-10-CM | POA: Diagnosis present

## 2013-08-16 DIAGNOSIS — N058 Unspecified nephritic syndrome with other morphologic changes: Secondary | ICD-10-CM | POA: Diagnosis present

## 2013-08-16 DIAGNOSIS — I420 Dilated cardiomyopathy: Secondary | ICD-10-CM

## 2013-08-16 DIAGNOSIS — I5041 Acute combined systolic (congestive) and diastolic (congestive) heart failure: Secondary | ICD-10-CM

## 2013-08-16 DIAGNOSIS — R531 Weakness: Secondary | ICD-10-CM

## 2013-08-16 DIAGNOSIS — I5043 Acute on chronic combined systolic (congestive) and diastolic (congestive) heart failure: Principal | ICD-10-CM | POA: Diagnosis present

## 2013-08-16 DIAGNOSIS — Z7982 Long term (current) use of aspirin: Secondary | ICD-10-CM

## 2013-08-16 DIAGNOSIS — I517 Cardiomegaly: Secondary | ICD-10-CM

## 2013-08-16 DIAGNOSIS — E785 Hyperlipidemia, unspecified: Secondary | ICD-10-CM

## 2013-08-16 DIAGNOSIS — E1165 Type 2 diabetes mellitus with hyperglycemia: Secondary | ICD-10-CM | POA: Diagnosis present

## 2013-08-16 DIAGNOSIS — R0789 Other chest pain: Secondary | ICD-10-CM

## 2013-08-16 DIAGNOSIS — N179 Acute kidney failure, unspecified: Secondary | ICD-10-CM

## 2013-08-16 DIAGNOSIS — N189 Chronic kidney disease, unspecified: Secondary | ICD-10-CM

## 2013-08-16 DIAGNOSIS — N289 Disorder of kidney and ureter, unspecified: Secondary | ICD-10-CM

## 2013-08-16 DIAGNOSIS — R0602 Shortness of breath: Secondary | ICD-10-CM

## 2013-08-16 DIAGNOSIS — I129 Hypertensive chronic kidney disease with stage 1 through stage 4 chronic kidney disease, or unspecified chronic kidney disease: Secondary | ICD-10-CM | POA: Diagnosis present

## 2013-08-16 DIAGNOSIS — IMO0002 Reserved for concepts with insufficient information to code with codable children: Secondary | ICD-10-CM

## 2013-08-16 DIAGNOSIS — E1129 Type 2 diabetes mellitus with other diabetic kidney complication: Secondary | ICD-10-CM | POA: Diagnosis present

## 2013-08-16 DIAGNOSIS — I160 Hypertensive urgency: Secondary | ICD-10-CM

## 2013-08-16 DIAGNOSIS — R609 Edema, unspecified: Secondary | ICD-10-CM

## 2013-08-16 DIAGNOSIS — D649 Anemia, unspecified: Secondary | ICD-10-CM

## 2013-08-16 DIAGNOSIS — Z87891 Personal history of nicotine dependence: Secondary | ICD-10-CM

## 2013-08-16 DIAGNOSIS — Z9089 Acquired absence of other organs: Secondary | ICD-10-CM

## 2013-08-16 LAB — BASIC METABOLIC PANEL
BUN: 29 mg/dL — ABNORMAL HIGH (ref 6–23)
CHLORIDE: 106 meq/L (ref 96–112)
CO2: 21 meq/L (ref 19–32)
Calcium: 9.1 mg/dL (ref 8.4–10.5)
Creatinine, Ser: 2.81 mg/dL — ABNORMAL HIGH (ref 0.50–1.10)
GFR calc Af Amer: 19 mL/min — ABNORMAL LOW (ref >90)
GFR calc non Af Amer: 17 mL/min — ABNORMAL LOW (ref >90)
Glucose, Bld: 69 mg/dL — ABNORMAL LOW (ref 70–99)
Potassium: 4.2 mEq/L (ref 3.7–5.3)
SODIUM: 139 meq/L (ref 137–147)

## 2013-08-16 LAB — GLUCOSE, CAPILLARY: Glucose-Capillary: 92 mg/dL (ref 70–99)

## 2013-08-16 LAB — CBC
HCT: 33.3 % — ABNORMAL LOW (ref 36.0–46.0)
Hemoglobin: 11.5 g/dL — ABNORMAL LOW (ref 12.0–15.0)
MCH: 28.8 pg (ref 26.0–34.0)
MCHC: 34.5 g/dL (ref 30.0–36.0)
MCV: 83.5 fL (ref 78.0–100.0)
PLATELETS: 268 10*3/uL (ref 150–400)
RBC: 3.99 MIL/uL (ref 3.87–5.11)
RDW: 13.8 % (ref 11.5–15.5)
WBC: 5.5 10*3/uL (ref 4.0–10.5)

## 2013-08-16 LAB — HEPATIC FUNCTION PANEL
ALT: 20 U/L (ref 0–35)
AST: 22 U/L (ref 0–37)
Albumin: 2.6 g/dL — ABNORMAL LOW (ref 3.5–5.2)
Alkaline Phosphatase: 116 U/L (ref 39–117)
Bilirubin, Direct: <0.2 mg/dL (ref 0.0–0.3)
TOTAL PROTEIN: 6.3 g/dL (ref 6.0–8.3)
Total Bilirubin: <0.2 mg/dL — ABNORMAL LOW (ref 0.3–1.2)

## 2013-08-16 LAB — I-STAT TROPONIN, ED: TROPONIN I, POC: 0.64 ng/mL — AB (ref 0.00–0.08)

## 2013-08-16 LAB — PRO B NATRIURETIC PEPTIDE: PRO B NATRI PEPTIDE: 15783 pg/mL — AB (ref 0–125)

## 2013-08-16 LAB — MRSA PCR SCREENING: MRSA by PCR: NEGATIVE

## 2013-08-16 LAB — TROPONIN I
Troponin I: 0.64 ng/mL (ref <0.30)
Troponin I: 0.63 ng/mL (ref <0.30)

## 2013-08-16 MED ORDER — SIMVASTATIN 20 MG PO TABS
20.0000 mg | ORAL_TABLET | Freq: Every day | ORAL | Status: DC
  Administered 2013-08-17 – 2013-08-19 (×3): 20 mg via ORAL
  Filled 2013-08-16 (×4): qty 1

## 2013-08-16 MED ORDER — BRIMONIDINE TARTRATE 0.2 % OP SOLN
1.0000 [drp] | Freq: Three times a day (TID) | OPHTHALMIC | Status: DC
  Administered 2013-08-16 – 2013-08-20 (×10): 1 [drp] via OPHTHALMIC
  Filled 2013-08-16: qty 5

## 2013-08-16 MED ORDER — HEPARIN SODIUM (PORCINE) 5000 UNIT/ML IJ SOLN
5000.0000 [IU] | Freq: Three times a day (TID) | INTRAMUSCULAR | Status: DC
  Administered 2013-08-16 – 2013-08-20 (×11): 5000 [IU] via SUBCUTANEOUS
  Filled 2013-08-16 (×14): qty 1

## 2013-08-16 MED ORDER — INSULIN ASPART 100 UNIT/ML ~~LOC~~ SOLN: 0.0000 [IU] | Freq: Three times a day (TID) | SUBCUTANEOUS | Status: DC

## 2013-08-16 MED ORDER — TIMOLOL MALEATE 0.5 % OP SOLN
1.0000 [drp] | Freq: Three times a day (TID) | OPHTHALMIC | Status: DC
  Administered 2013-08-16 – 2013-08-20 (×10): 1 [drp] via OPHTHALMIC
  Filled 2013-08-16: qty 5

## 2013-08-16 MED ORDER — FUROSEMIDE 10 MG/ML IJ SOLN
40.0000 mg | Freq: Two times a day (BID) | INTRAMUSCULAR | Status: DC
  Administered 2013-08-17 (×2): 40 mg via INTRAVENOUS
  Filled 2013-08-16 (×6): qty 4

## 2013-08-16 MED ORDER — ONDANSETRON HCL 4 MG/2ML IJ SOLN
4.0000 mg | Freq: Four times a day (QID) | INTRAMUSCULAR | Status: DC | PRN
Start: 2013-08-16 — End: 2013-08-20

## 2013-08-16 MED ORDER — SODIUM CHLORIDE 0.9 % IJ SOLN
3.0000 mL | Freq: Two times a day (BID) | INTRAMUSCULAR | Status: DC
  Administered 2013-08-16 – 2013-08-17 (×2): 3 mL via INTRAVENOUS

## 2013-08-16 MED ORDER — AMLODIPINE BESYLATE 10 MG PO TABS
10.0000 mg | ORAL_TABLET | Freq: Every morning | ORAL | Status: DC
  Administered 2013-08-16 – 2013-08-20 (×5): 10 mg via ORAL
  Filled 2013-08-16 (×5): qty 1

## 2013-08-16 MED ORDER — NITROGLYCERIN IN D5W 200-5 MCG/ML-% IV SOLN
2.0000 ug/min | INTRAVENOUS | Status: DC
  Administered 2013-08-16: 5 ug/min via INTRAVENOUS
  Filled 2013-08-16: qty 250

## 2013-08-16 MED ORDER — SODIUM CHLORIDE 0.9 % IJ SOLN: 3.0000 mL | INTRAMUSCULAR | Status: DC | PRN

## 2013-08-16 MED ORDER — ASPIRIN 81 MG PO CHEW
324.0000 mg | CHEWABLE_TABLET | Freq: Once | ORAL | Status: AC
  Administered 2013-08-16: 324 mg via ORAL
  Filled 2013-08-16: qty 4

## 2013-08-16 MED ORDER — ASPIRIN EC 81 MG PO TBEC
81.0000 mg | DELAYED_RELEASE_TABLET | Freq: Every morning | ORAL | Status: DC
Start: 2013-08-17 — End: 2013-08-20
  Administered 2013-08-17 – 2013-08-20 (×4): 81 mg via ORAL
  Filled 2013-08-16 (×4): qty 1

## 2013-08-16 MED ORDER — BRIMONIDINE TARTRATE-TIMOLOL 0.2-0.5 % OP SOLN: 1.0000 [drp] | Freq: Three times a day (TID) | OPHTHALMIC | Status: DC

## 2013-08-16 MED ORDER — HYDRALAZINE HCL 20 MG/ML IJ SOLN
10.0000 mg | Freq: Four times a day (QID) | INTRAMUSCULAR | Status: DC | PRN
  Administered 2013-08-16: 10 mg via INTRAVENOUS
  Filled 2013-08-16: qty 1
  Filled 2013-08-16: qty 0.5

## 2013-08-16 MED ORDER — ACETAMINOPHEN 325 MG PO TABS: 650.0000 mg | ORAL_TABLET | ORAL | Status: DC | PRN

## 2013-08-16 MED ORDER — FUROSEMIDE 10 MG/ML IJ SOLN
40.0000 mg | Freq: Once | INTRAMUSCULAR | Status: AC
  Administered 2013-08-16: 40 mg via INTRAVENOUS
  Filled 2013-08-16: qty 4

## 2013-08-16 MED ORDER — SODIUM CHLORIDE 0.9 % IV SOLN
250.0000 mL | INTRAVENOUS | Status: DC | PRN
  Administered 2013-08-17: 250 mL via INTRAVENOUS

## 2013-08-16 NOTE — ED Provider Notes (Signed)
CSN: [id number]     Arrival date & time 08/16/13  1729 History   First MD Initiated Contact with Patient 08/16/13 1751     Chief Complaint  Patient presents with  . Shortness of Breath  . Leg Swelling     (Consider location/radiation/quality/duration/timing/severity/associated sxs/prior Treatment) HPI 63 year old female presents with leg swelling for the past month. She states his been intermittent and bilateral. She's also been having chest discomfort when lying flat, especially at night. She also is low but of trouble breathing during this time as well. She's not had increased number of pillows she sleeps on. She's not wake up in the middle the night short of breath. She's never had leg swelling like this before. She does not get chest discomfort when walking. She felt a little nauseous today but this is spontaneously resolved. She used to have a primary care physician but do to change her insurance she currently has none. She states her symptoms have not particularly worsened but because they continued she sought care. Last chest discomfort was about 3 hours ago and lasted "a few minutes". Denies discomfort/pain now.  Past Medical History  Diagnosis Date  . Hypertension   . Diabetes mellitus   . Anemia   . Hyperlipidemia   . Left ventricular hypertrophy    Past Surgical History  Procedure Laterality Date  . Cholecystectomy     Family History  Problem Relation Age of Onset  . Diabetes type II    . Hypertension     History  Substance Use Topics  . Smoking status: Former Smoker    Types: Cigarettes    Quit date: 03/19/1966  . Smokeless tobacco: Never Used  . Alcohol Use: No   OB History   Grav Para Term Preterm Abortions TAB SAB Ect Mult Living                 Review of Systems  Respiratory: Positive for shortness of breath. Negative for cough.   Cardiovascular: Positive for chest pain and leg swelling.  Gastrointestinal: Positive for nausea. Negative for vomiting and  abdominal pain.  All other systems reviewed and are negative.     Allergies  Nitrofurantoin monohyd macro  Home Medications   Prior to Admission medications   Medication Sig Start Date End Date Taking? Authorizing Provider  amLODipine (NORVASC) 5 MG tablet Take 1 tablet (5 mg total) by mouth every morning. 11/05/12   Shade Flood, MD  aspirin EC 81 MG tablet Take 81 mg by mouth every morning.    Historical Provider, MD  cephALEXin (KEFLEX) 500 MG capsule Take 1 capsule (500 mg total) by mouth every 6 (six) hours. 05/18/12   Glori Luis, MD  cloNIDine (CATAPRES) 0.1 MG tablet Take 1 tablet (0.1 mg total) by mouth 2 (two) times daily. 11/05/12   Shade Flood, MD  glimepiride (AMARYL) 4 MG tablet Take 1 tablet (4 mg total) by mouth daily before breakfast. 12/17/12   Shade Flood, MD  glucose blood test strip Use as instructed 11/17/12   Shade Flood, MD  Lancets [facility] ULTRASOFT) lancets Use as instructed 11/17/12   Shade Flood, MD  pravastatin (PRAVACHOL) 40 MG tablet Take 1 tablet (40 mg total) by mouth daily. 12/17/12   Shade Flood, MD   BP 190/111  Pulse 92  Temp(Src) 98.3 F (36.8 C) (Oral)  Resp 23  SpO2 93% Physical Exam  Nursing note and vitals reviewed. Constitutional: She is oriented to person, place,  and time. She appears well-developed and well-nourished. No distress.  HENT:  Head: Normocephalic and atraumatic.  Right Ear: External ear normal.  Left Ear: External ear normal.  Nose: Nose normal.  Eyes: Right eye exhibits no discharge. Left eye exhibits no discharge.  Cardiovascular: Normal rate, regular rhythm and normal heart sounds.   Pulmonary/Chest: Effort normal. She has rales in the right lower field and the left lower field.  Abdominal: Soft. She exhibits no distension. There is no tenderness.  Neurological: She is alert and oriented to person, place, and time.  Skin: Skin is warm and dry.    ED Course  Procedures (including  critical care time) Labs Review Labs Reviewed  CBC - Abnormal; Notable for the following:    Hemoglobin 11.5 (*)    HCT 33.3 (*)    All other components within normal limits  BASIC METABOLIC PANEL - Abnormal; Notable for the following:    Glucose, Bld 69 (*)    BUN 29 (*)    Creatinine, Ser 2.81 (*)    GFR calc non Af Amer 17 (*)    GFR calc Af Amer 19 (*)    All other components within normal limits  PRO B NATRIURETIC PEPTIDE - Abnormal; Notable for the following:    Pro B Natriuretic peptide (BNP) 15783.0 (*)    All other components within normal limits  HEPATIC FUNCTION PANEL - Abnormal; Notable for the following:    Albumin 2.6 (*)    Total Bilirubin <0.2 (*)    All other components within normal limits  TROPONIN I - Abnormal; Notable for the following:    Troponin I 0.64 (*)    All other components within normal limits  I-STAT TROPOININ, ED - Abnormal; Notable for the following:    Troponin i, poc 0.64 (*)    All other components within normal limits  BASIC METABOLIC PANEL  TROPONIN I  TROPONIN I  TROPONIN I  LIPID PANEL  HEMOGLOBIN A1C  TSH  MAGNESIUM    Imaging Review Dg Chest 2 View  08/16/2013   CLINICAL DATA:  Short of breath.  Leg swelling.  EXAM: CHEST  2 VIEW  COMPARISON:  04/18/2011.  FINDINGS: Lung volumes are low. There is basilar atelectasis and bilateral pleural effusions. The effusions appear small. Pulmonary vascular congestion is present. Cholecystectomy clips are present in the right upper quadrant.  IMPRESSION: Findings compatible with mild CHF/ volume overload with bilateral pleural effusions and associated atelectasis.   Electronically Signed   By: Andreas NewportGeoffrey  Lamke M.D.   On: 08/16/2013 18:25     EKG Interpretation   Date/Time:  Sunday Aug 16 2013 17:47:38 EDT Ventricular Rate:  93 PR Interval:  127 QRS Duration: 128 QT Interval:  395 QTC Calculation: 491 R Axis:   -56 Text Interpretation:  Sinus rhythm Right bundle branch block LVH with  IVCD  and secondary repol abnrm Borderline prolonged QT interval Baseline wander  in lead(s) V4 nonspecific T waves compared to 2013 Confirmed by Serafin Decatur   MD, Mickell Birdwell ([id number]) on 08/16/2013 5:54:33 PM      MDM   Final diagnoses:  CHF (congestive heart failure)    Patient is currently asymptomatic and in no distress. No hypoxia. Does have evidence of new CHF. EKG shows some nonspecific changes, but no obvious ischemia. Mildly elevated troponin, last chest pain several hours prior. After discussing with Dr. Tresa EndoKelly (cards on call), he feels this is most likely from her prolonged CHF and does not represent acute ischemia (this  seems most likely based on her history and current exam). Given ASA but pain free. She does have some increased Cr from most recent baseline, but given the strain on her heart will start on lasix in ED and admit to hospitalist.    Audree Camel, MD 08/16/13 [phone]

## 2013-08-16 NOTE — H&P (Signed)
[facility] History and Physical  Jordan CleverlyBrenda E Kurkowski OZH:[id number] DOB: 1950/04/30 DOA: 08/16/2013  Referring physician: EDP PCP: Corey HaroldWARD,AMY, MD   Chief Complaint: SOB   HPI: [name] is a 63 y.o. female h/o HTN, DM2, CKD, presents to ED with 1 month history of SOB, BLE swelling.  Does have occasional chest pain as well.  SOB is worse when lying flat, better when upright.  Using multiple pillows to sleep.  CP lasts at most a few mins at a time, last episode several hours PTA.  Review of Systems: Systems reviewed.  As above, otherwise negative  Past Medical History  Diagnosis Date  . Hypertension   . Diabetes mellitus   . Anemia   . Hyperlipidemia   . Left ventricular hypertrophy    Past Surgical History  Procedure Laterality Date  . Cholecystectomy     Social History:  reports that she quit smoking about 47 years ago. Her smoking use included Cigarettes. She smoked 0.00 packs per day. She has never used smokeless tobacco. She reports that she does not drink alcohol or use illicit drugs.  Allergies  Allergen Reactions  . Nitrofurantoin Monohyd Macro Nausea Only    Family History  Problem Relation Age of Onset  . Diabetes type II    . Hypertension       Prior to Admission medications   Medication Sig Start Date End Date Taking? Authorizing Provider  amLODipine (NORVASC) 5 MG tablet Take 1 tablet (5 mg total) by mouth every morning. 11/05/12  Yes Shade FloodJeffrey R Greene, MD  aspirin EC 81 MG tablet Take 81 mg by mouth every morning.   Yes Historical Provider, MD  COMBIGAN 0.2-0.5 % ophthalmic solution Place 1 drop into both eyes 3 (three) times daily.  08/16/13  Yes Historical Provider, MD  glimepiride (AMARYL) 4 MG tablet Take 1 tablet (4 mg total) by mouth daily before breakfast. 12/17/12  Yes Shade FloodJeffrey R Greene, MD  pravastatin (PRAVACHOL) 40 MG tablet Take 1 tablet (40 mg total) by mouth daily. 12/17/12  Yes Shade FloodJeffrey R Greene, MD  glucose blood test strip Use as instructed  11/17/12   Shade FloodJeffrey R Greene, MD  Lancets [facility](ONETOUCH ULTRASOFT) lancets Use as instructed 11/17/12   Shade FloodJeffrey R Greene, MD   Physical Exam: Filed Vitals:   08/16/13 1929  BP: 186/99  Pulse: 95  Temp:   Resp: 22    BP 186/99  Pulse 95  Temp(Src) 98.3 F (36.8 C) (Oral)  Resp 22  SpO2 99%  General Appearance:    Alert, oriented, no distress, appears stated age  Head:    Normocephalic, atraumatic  Eyes:    PERRL, EOMI, sclera non-icteric        Nose:   Nares without drainage or epistaxis. Mucosa, turbinates normal  Throat:   Moist mucous membranes. Oropharynx without erythema or exudate.  Neck:   Supple. No carotid bruits.  No thyromegaly.  No lymphadenopathy.   Back:     No CVA tenderness, no spinal tenderness  Lungs:     Clear to auscultation bilaterally, without wheezes, rhonchi or rales  Chest wall:    No tenderness to palpitation  Heart:    Regular rate and rhythm without murmurs, gallops, rubs  Abdomen:     Soft, non-tender, nondistended, normal bowel sounds, no organomegaly  Genitalia:    deferred  Rectal:    deferred  Extremities:   No clubbing, cyanosis or edema.  Pulses:   2+ and symmetric all extremities  Skin:  Skin color, texture, turgor normal, no rashes or lesions  Lymph nodes:   Cervical, supraclavicular, and axillary nodes normal  Neurologic:   CNII-XII intact. Normal strength, sensation and reflexes      throughout    Labs on Admission:  Basic Metabolic Panel:  Recent Labs Lab 08/16/13 1758  NA 139  K 4.2  CL 106  CO2 21  GLUCOSE 69*  BUN 29*  CREATININE 2.81*  CALCIUM 9.1   Liver Function Tests:  Recent Labs Lab 08/16/13 1758  AST 22  ALT 20  ALKPHOS 116  BILITOT <0.2*  PROT 6.3  ALBUMIN 2.6*   No results found for this basename: LIPASE, AMYLASE,  in the last 168 hours No results found for this basename: AMMONIA,  in the last 168 hours CBC:  Recent Labs Lab 08/16/13 1758  WBC 5.5  HGB 11.5*  HCT 33.3*  MCV 83.5  PLT 268    Cardiac Enzymes:  Recent Labs Lab 08/16/13 1758  TROPONINI 0.64*    BNP (last 3 results)  Recent Labs  08/16/13 1758  PROBNP 15783.0*   CBG: No results found for this basename: GLUCAP,  in the last 168 hours  Radiological Exams on Admission: Dg Chest 2 View  08/16/2013   CLINICAL DATA:  Short of breath.  Leg swelling.  EXAM: CHEST  2 VIEW  COMPARISON:  04/18/2011.  FINDINGS: Lung volumes are low. There is basilar atelectasis and bilateral pleural effusions. The effusions appear small. Pulmonary vascular congestion is present. Cholecystectomy clips are present in the right upper quadrant.  IMPRESSION: Findings compatible with mild CHF/ volume overload with bilateral pleural effusions and associated atelectasis.   Electronically Signed   By: Andreas Newport M.D.   On: 08/16/2013 18:25    EKG: Independently reviewed.  Assessment/Plan Principal Problem:   Acute CHF (congestive heart failure) Active Problems:   DM2 (diabetes mellitus, type 2)   HTN (hypertension)   Acute on chronic renal insufficiency   1. Acute CHF - 1. 2d echo ordered 2. Lasix 40mg  IV BID ordered for now, got 40mg  tonight, adjust dose to try and meet 1-2L net negative goal 3. Serial trops 4. Tele 2. HTN - 1. Increased norvasc to 10mg  BID 2. No ACEI due to renal insufficiency, no beta blocker in acute decompensation with unknown EF. 3. Hydralazine PRN 4. If above does not work, add NTG 3. DM2 - hold glipizide and putting patient on med dose SSI ac/hs 4. AKI on CKD - unclear how much of this represents a deterioration of baseline, no ACEI, daily BMP ordered, strict intake and output, monitor closely as we are adding lasix.  Dr. Tresa Endo has already consulted on the patient for cardiology, his note is in the chart.  Code Status: Full Code  Family Communication: No family in room Disposition Plan: Admit to SDU   Time spent: 70 min  Hillary Bow [facility] Pager [phone]  If 7AM-7PM,  please contact the day team taking care of the patient [email or web address] Password [email or web address] 08/16/2013, 8:05 PM

## 2013-08-16 NOTE — Consult Note (Signed)
Reason for Consult: heart failure Referring Physician: Dr. Bennie Norman is an 63 y.o. female.  HPI: [name] is a 63 yo woman with known PMH of HTN, T2DM, dyslipidemia and renal insufficiency who presents with worsening shortness of breath and bilateral leg swelling over the last one month. She also endorses occasional chest discomfort particularly lying flat in the evenings. She is sleeping on multiple pillows and has occasional PND. She describes the chest pain as uncomfortable and lasting at most a few minutes with the last episode several hours prior to ER presentation. Cardiology consulted given significantly elevated BNP and very mildly elevated troponin in setting of renal dysfunction and heart failure. 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 She is able to get around well and perform all of her ADLs. She is accompanied by her daughter and husband.   Past Medical History  Diagnosis Date  . Hypertension   . Diabetes mellitus   . Anemia   . Hyperlipidemia   . Left ventricular hypertrophy     Past Surgical History  Procedure Laterality Date  . Cholecystectomy      Family History  Problem Relation Age of Onset  . Diabetes type II    . Hypertension      Social History:  reports that she quit smoking about 47 years ago. Her smoking use included Cigarettes. She smoked 0.00 packs per day. She has never used smokeless tobacco. She reports that she does not drink alcohol or use illicit drugs.  Allergies:  Allergies  Allergen Reactions  . Nitrofurantoin Monohyd Macro Nausea Only    Medications:  I have reviewed the patient's current medications. Prior to Admission:  (Not in a hospital admission) Scheduled: . furosemide  40 mg Intravenous Once   Continuous:   Results for orders placed during the hospital encounter of 08/16/13 (from the past 48 hour(s))  CBC     Status: Abnormal   Collection Time   08/16/13  5:58 PM      Result Value Ref Range   WBC 5.5  4.0 - 10.5 K/uL   RBC 3.99  3.87 - 5.11 MIL/uL   Hemoglobin 11.5 (*) 12.0 - 15.0 g/dL   HCT 33.3 (*) 36.0 - 46.0 %   MCV 83.5  78.0 - 100.0 fL   MCH 28.8  26.0 - 34.0 pg   MCHC 34.5  30.0 - 36.0 g/dL   RDW 13.8  11.5 - 15.5 %   Platelets 268  150 - 400 K/uL  BASIC METABOLIC PANEL     Status: Abnormal   Collection Time    08/16/13  5:58 PM      Result Value Ref Range   Sodium 139  137 - 147 mEq/L   Potassium 4.2  3.7 - 5.3 mEq/L   Chloride 106  96 - 112 mEq/L   CO2 21  19 - 32 mEq/L   Glucose, Bld 69 (*) 70 - 99 mg/dL   BUN 29 (*) 6 - 23 mg/dL   Creatinine, Ser 2.81 (*) 0.50 - 1.10 mg/dL   Calcium 9.1  8.4 - 10.5 mg/dL   GFR calc non Af Amer 17 (*) >90 mL/min   GFR calc Af Amer 19 (*) >90 mL/min   Comment: (NOTE)     The eGFR has been calculated using the CKD EPI equation.     This calculation has not been validated in all clinical situations.  eGFR's persistently <90 mL/min signify possible Chronic Kidney     Disease.  PRO B NATRIURETIC PEPTIDE     Status: Abnormal   Collection Time    08/16/13  5:58 PM      Result Value Ref Range   Pro B Natriuretic peptide (BNP) 15783.0 (*) 0 - 125 pg/mL  HEPATIC FUNCTION PANEL     Status: Abnormal   Collection Time    08/16/13  5:58 PM      Result Value Ref Range   Total Protein 6.3  6.0 - 8.3 g/dL   Albumin 2.6 (*) 3.5 - 5.2 g/dL   AST 22  0 - 37 U/L   ALT 20  0 - 35 U/L   Alkaline Phosphatase 116  39 - 117 U/L   Total Bilirubin <0.2 (*) 0.3 - 1.2 mg/dL   Bilirubin, Direct <0.2  0.0 - 0.3 mg/dL   Indirect Bilirubin NOT CALCULATED  0.3 - 0.9 mg/dL  Randolm Idol, ED     Status: Abnormal   Collection Time    08/16/13  6:05 PM      Result Value Ref Range   Troponin i, poc 0.64 (*) 0.00 - 0.08 ng/mL   Comment NOTIFIED PHYSICIAN     Comment 3            Comment: Due to the release kinetics of cTnI,     a negative result within the first hours     of the onset of  symptoms does not rule out     myocardial infarction with certainty.     If myocardial infarction is still suspected,     repeat the test at appropriate intervals.    Dg Chest 2 View  08/16/2013   CLINICAL DATA:  Short of breath.  Leg swelling.  EXAM: CHEST  2 VIEW  COMPARISON:  04/18/2011.  FINDINGS: Lung volumes are low. There is basilar atelectasis and bilateral pleural effusions. The effusions appear small. Pulmonary vascular congestion is present. Cholecystectomy clips are present in the right upper quadrant.  IMPRESSION: Findings compatible with mild CHF/ volume overload with bilateral pleural effusions and associated atelectasis.   Electronically Signed   By: Dereck Ligas M.D.   On: 08/16/2013 18:25    Review of Systems  Constitutional: Positive for malaise/fatigue. Negative for fever, chills and weight loss.  HENT: Negative for hearing loss and tinnitus.   Eyes: Negative for double vision and photophobia.  Respiratory: Positive for shortness of breath. Negative for cough.   Cardiovascular: Positive for chest pain, orthopnea, leg swelling and PND. Negative for palpitations.  Gastrointestinal: Positive for heartburn. Negative for nausea, vomiting and abdominal pain.  Genitourinary: Negative for dysuria and hematuria.  Musculoskeletal: Negative for myalgias and neck pain.  Skin: Negative for rash.  Neurological: Negative for dizziness, tingling and tremors.  Endo/Heme/Allergies: Negative for polydipsia. Does not bruise/bleed easily.  Psychiatric/Behavioral: Negative for depression, suicidal ideas and substance abuse.   Blood pressure 190/111, pulse 92, temperature 98.3 F (36.8 C), temperature source Oral, resp. rate 23, SpO2 93.00%. Physical Exam  Nursing note and vitals reviewed. Constitutional: She is oriented to person, place, and time. She appears well-developed and well-nourished. No distress.  HENT:  Head: Atraumatic.  Nose: Nose normal.  Mouth/Throat: Oropharynx is  clear and moist. No oropharyngeal exudate.  Eyes: Conjunctivae and EOM are normal. Pupils are equal, round, and reactive to light. No scleral icterus.  Neck: Normal range of motion. Neck supple. JVD present. No tracheal deviation present.  jvp  midneck with +HJR  Cardiovascular: Normal rate, regular rhythm, normal heart sounds and intact distal pulses.  Exam reveals no gallop.   No murmur heard. Respiratory: Effort normal. No respiratory distress. She has no wheezes.  Decreased BS at bases that clear superiorly  GI: Soft. Bowel sounds are normal. She exhibits no distension. There is no tenderness. There is no rebound.  Musculoskeletal: Normal range of motion. She exhibits edema. She exhibits no tenderness.  Neurological: She is alert and oriented to person, place, and time. No cranial nerve deficit. Coordination normal.  Skin: Skin is warm and dry. No rash noted. She is not diaphoretic. No erythema.  Psychiatric: She has a normal mood and affect. Her behavior is normal. Judgment and thought content normal.   Labs reviewed; na 139, K 4.2, bun/cr 29/2.8, glucose 69, wbc 5.5, h/h 11.5/33.3, plt 260s Trop I 0.64; proBNP 15.7k  Chest x-ray: bilateral effusions, increased vascularity  ECG: Sinus rhythm, RBBB, LVH  Problem List Acute heart failure  Mildly elevated Troponin in setting of heart failure and chronic kidney disease  Hypertensive Urgency  Hypertension - longstanding with LVH on ECG Acute on chronic renal insufficiency/kidney disease  T2DM  Anemia  Dyslipidemia  RBBB on ECG  Assessment/Plan: Ms. Szczygiel is a 63 yo woman with PMH of T2DM, HTN, dyslipidemia, CKD who presents with 1 month of progressive SOB, lower extremity edema and more recently some intermittent chest pain. I favor a diagnosis of heart failure as the etiology of her symptoms, elevated troponin (along with significant CKD stage IV). She received 40 mg IV lasix in the ER - will assess response, evaluate for LV  function/structural abnl with echocardiogram in AM and move towards appropriate medical therapies (ace-, bb, aldosterone antagonism).  - telemetry - trend cardiac markers - IV diuresis, goal 1-2L net negative daily  - defer bb for now given decompensation and unknown EF - no ace- 2/2 significant CKD currently  - continue asa 81 mg daily, amlodipine can be increased to 10 mg daily - would use IV hydralazine 10 mg q6h for sbp > 170  - can also consider IV NTG gtt for afterload reduction/bp control - continue statin - lipid panel in AM, TSH, Mg, hba1c - echocardiogram  [name] 08/16/2013, 6:56 PM

## 2013-08-16 NOTE — ED Notes (Signed)
Pt reports SOB when laying down, onset a few days ago. Also c/o lower leg/ feet swelling x a few weeks that has worsened significantly the past few days. Denies hx of CHF. Denies pain. Denies N/V/D.

## 2013-08-17 DIAGNOSIS — E1129 Type 2 diabetes mellitus with other diabetic kidney complication: Secondary | ICD-10-CM

## 2013-08-17 DIAGNOSIS — N184 Chronic kidney disease, stage 4 (severe): Secondary | ICD-10-CM | POA: Diagnosis present

## 2013-08-17 DIAGNOSIS — R0789 Other chest pain: Secondary | ICD-10-CM | POA: Diagnosis present

## 2013-08-17 DIAGNOSIS — I4510 Unspecified right bundle-branch block: Secondary | ICD-10-CM

## 2013-08-17 DIAGNOSIS — IMO0002 Reserved for concepts with insufficient information to code with codable children: Secondary | ICD-10-CM | POA: Diagnosis present

## 2013-08-17 DIAGNOSIS — I160 Hypertensive urgency: Secondary | ICD-10-CM | POA: Diagnosis present

## 2013-08-17 DIAGNOSIS — E1122 Type 2 diabetes mellitus with diabetic chronic kidney disease: Secondary | ICD-10-CM | POA: Diagnosis present

## 2013-08-17 DIAGNOSIS — I369 Nonrheumatic tricuspid valve disorder, unspecified: Secondary | ICD-10-CM

## 2013-08-17 DIAGNOSIS — I517 Cardiomegaly: Secondary | ICD-10-CM

## 2013-08-17 DIAGNOSIS — E1165 Type 2 diabetes mellitus with hyperglycemia: Secondary | ICD-10-CM

## 2013-08-17 LAB — GLUCOSE, CAPILLARY
GLUCOSE-CAPILLARY: 67 mg/dL — AB (ref 70–99)
GLUCOSE-CAPILLARY: 95 mg/dL (ref 70–99)
GLUCOSE-CAPILLARY: 100 mg/dL — AB (ref 70–99)
GLUCOSE-CAPILLARY: 119 mg/dL — AB (ref 70–99)
Glucose-Capillary: 138 mg/dL — ABNORMAL HIGH (ref 70–99)

## 2013-08-17 LAB — TROPONIN I
TROPONIN I: 0.64 ng/mL — AB (ref <0.30)
Troponin I: 0.65 ng/mL (ref <0.30)

## 2013-08-17 LAB — MAGNESIUM: MAGNESIUM: 2.2 mg/dL (ref 1.5–2.5)

## 2013-08-17 LAB — BASIC METABOLIC PANEL
BUN: 29 mg/dL — AB (ref 6–23)
CALCIUM: 8.7 mg/dL (ref 8.4–10.5)
CO2: 22 meq/L (ref 19–32)
Chloride: 107 mEq/L (ref 96–112)
Creatinine, Ser: 2.86 mg/dL — ABNORMAL HIGH (ref 0.50–1.10)
GFR calc Af Amer: 19 mL/min — ABNORMAL LOW (ref >90)
GFR, EST NON AFRICAN AMERICAN: 16 mL/min — AB (ref >90)
GLUCOSE: 69 mg/dL — AB (ref 70–99)
Potassium: 3.9 mEq/L (ref 3.7–5.3)
Sodium: 139 mEq/L (ref 137–147)

## 2013-08-17 LAB — LIPID PANEL
Cholesterol: 215 mg/dL — ABNORMAL HIGH (ref 0–200)
HDL: 75 mg/dL (ref >39)
LDL Cholesterol: 120 mg/dL — ABNORMAL HIGH (ref 0–99)
TRIGLYCERIDES: 98 mg/dL (ref <150)
Total CHOL/HDL Ratio: 2.9 RATIO
VLDL: 20 mg/dL (ref 0–40)

## 2013-08-17 LAB — HEMOGLOBIN A1C
Hgb A1c MFr Bld: 7.6 % — ABNORMAL HIGH (ref <5.7)
MEAN PLASMA GLUCOSE: 171 mg/dL — AB (ref <117)

## 2013-08-17 LAB — TSH: TSH: 1.44 u[IU]/mL (ref 0.350–4.500)

## 2013-08-17 MED ORDER — CARVEDILOL 3.125 MG PO TABS
3.1250 mg | ORAL_TABLET | Freq: Two times a day (BID) | ORAL | Status: DC
  Administered 2013-08-17 – 2013-08-18 (×3): 3.125 mg via ORAL
  Filled 2013-08-17 (×5): qty 1

## 2013-08-17 MED ORDER — INSULIN ASPART 100 UNIT/ML ~~LOC~~ SOLN
0.0000 [IU] | Freq: Three times a day (TID) | SUBCUTANEOUS | Status: DC
  Administered 2013-08-18: 2 [IU] via SUBCUTANEOUS
  Administered 2013-08-18 – 2013-08-19 (×3): 1 [IU] via SUBCUTANEOUS
  Administered 2013-08-19: 2 [IU] via SUBCUTANEOUS
  Administered 2013-08-20: 1 [IU] via SUBCUTANEOUS

## 2013-08-17 MED ORDER — PNEUMOCOCCAL VAC POLYVALENT 25 MCG/0.5ML IJ INJ
0.5000 mL | INJECTION | INTRAMUSCULAR | Status: AC
  Administered 2013-08-18: 0.5 mL via INTRAMUSCULAR
  Filled 2013-08-17 (×2): qty 0.5

## 2013-08-17 NOTE — Progress Notes (Signed)
Hypoglycemic Event  CBG: 67  Treatment: 15 GM carbohydrate snack  Symptoms: None  Follow-up CBG: Time:08:18 CBG Result:95  Possible Reasons for Event: Inadequate meal intake  Comments/MD notified:    Joylene Draft  Remember to initiate Hypoglycemia Order Set & complete

## 2013-08-17 NOTE — Progress Notes (Signed)
PROGRESS NOTE    Jordan CleverlyBrenda E Norman ZOX:[id number] DOB: 07/19/1950 DOA: 08/16/2013 PCP: Corey HaroldWARD,AMY, MD  HPI/Brief narrative 63 year old female with history of HTN, DM 2, CKD, HLD, anemia presented to the ED on 08/16/13 with one month history of dyspnea, orthopnea, bilateral leg edema and chest discomfort. Patient was found to be in decompensated CHF and mildly elevated troponins likely secondary to decompensated CHF in the context of chronic kidney disease. Cardiology seeing.   Assessment/Plan:  1. Acute CHF: Unknown type-2-D echo pending. Admitted to step down unit. Started on IV Lasix and IV NTG. Clinically improved. Cardiology input appreciated-starting low dose beta blockers. 2. Chest discomfort/elevated troponin in the setting of acute CHF and CKD: Cardiology does not believe patient has an MI. Chest discomfort has resolved. Followup 2-D echo results. Low dose beta blockers initiated. 3. Hypertensive urgency: Continue amlodipine. Started low dose Coreg 3.125 mg by mouth twice a day. Taper NTG to discontinue. 4. Stage IV chronic kidney disease: Likely secondary to diabetic nephropathy +/- hypertensive nephrosclerosis. Creatinine in August 2014 was 1.82. This may be progressive chronic kidney disease rather than acute on chronic renal failure. Trend creatinine. Controlled diabetes and hypertension 5. Type II DM with renal complications/hypoglycemia: Patient had blood glucose of 67 on labs this morning. We'll change sliding scale to sensitive. Monitor closely. 6. Anemia: Likely secondary to chronic kidney disease. Follow CBC. 7. Hyperlipidemia: LDL 120. Continue statins   Code Status: Full Family Communication: none at bedside Disposition Plan: Home when medically stable. Transfer to telemetry.   Consultants:  Cardiology  Procedures:  None  Antibiotics:  None   Subjective: Patient states that she feels much better with improvement in her dyspnea. Leg swellings decreasing. Denies  chest pain.  Objective: Filed Vitals:   08/17/13 1000 08/17/13 1100 08/17/13 1200 08/17/13 1300  BP: 152/88 142/86 136/80 149/78  Pulse: 89 80 79 81  Temp:   97.9 F (36.6 C)   TempSrc:   Axillary   Resp: 28 20 20    Height:      Weight:      SpO2: 97% 97% 95% 94%    Intake/Output Summary (Last 24 hours) at 08/17/13 1313 Last data filed at 08/17/13 1103  Gross per 24 hour  Intake 306.15 ml  Output   2675 ml  Net -2368.85 ml   Filed Weights   08/16/13 1924 08/17/13 0500  Weight: 63.5 kg (139 lb 15.9 oz) 63.5 kg (139 lb 15.9 oz)     Exam:  General exam: Pleasant middle-aged female lying comfortably in bed. Respiratory system: Diminished breath sounds in the bases with occasional basal crackles. Rest of lung fields clear to auscultation. No increased work of breathing. Cardiovascular system: S1 & S2 heard, RRR. No murmurs, gallops, clicks . JVD +. 1+ pitting bilateral leg edema to knees. Gastrointestinal system: Abdomen is nondistended, soft and nontender. Normal bowel sounds heard. Central nervous system: Alert and oriented. No focal neurological deficits. Extremities: Symmetric 5 x 5 power.   Data Reviewed: Basic Metabolic Panel:  Recent Labs Lab 08/16/13 1758 08/17/13 0139  NA 139 139  K 4.2 3.9  CL 106 107  CO2 21 22  GLUCOSE 69* 69*  BUN 29* 29*  CREATININE 2.81* 2.86*  CALCIUM 9.1 8.7  MG  --  2.2   Liver Function Tests:  Recent Labs Lab 08/16/13 1758  AST 22  ALT 20  ALKPHOS 116  BILITOT <0.2*  PROT 6.3  ALBUMIN 2.6*   No results found for this basename: LIPASE,  AMYLASE,  in the last 168 hours No results found for this basename: AMMONIA,  in the last 168 hours CBC:  Recent Labs Lab 08/16/13 1758  WBC 5.5  HGB 11.5*  HCT 33.3*  MCV 83.5  PLT 268   Cardiac Enzymes:  Recent Labs Lab 08/16/13 1758 08/16/13 1935 08/17/13 0136 08/17/13 0731  TROPONINI 0.64* 0.63* 0.64* 0.65*   BNP (last 3 results)  Recent Labs  08/16/13 1758    PROBNP 15783.0*   CBG:  Recent Labs Lab 08/16/13 2133 08/17/13 0735 08/17/13 0818 08/17/13 1223  GLUCAP 92 67* 95 100*    Recent Results (from the past 240 hour(s))  MRSA PCR SCREENING     Status: None   Collection Time    08/16/13  8:20 PM      Result Value Ref Range Status   MRSA by PCR NEGATIVE  NEGATIVE Final   Comment:            The GeneXpert MRSA Assay (FDA     approved for NASAL specimens     only), is one component of a     comprehensive MRSA colonization     surveillance program. It is not     intended to diagnose MRSA     infection nor to guide or     monitor treatment for     MRSA infections.      Additional labs: 1. Fasting lipids: Cholesterol 215, triglycerides 98, HDL 75, LDL 120, VLDL 20     Studies: Dg Chest 2 View  08/16/2013   CLINICAL DATA:  Short of breath.  Leg swelling.  EXAM: CHEST  2 VIEW  COMPARISON:  04/18/2011.  FINDINGS: Lung volumes are low. There is basilar atelectasis and bilateral pleural effusions. The effusions appear small. Pulmonary vascular congestion is present. Cholecystectomy clips are present in the right upper quadrant.  IMPRESSION: Findings compatible with mild CHF/ volume overload with bilateral pleural effusions and associated atelectasis.   Electronically Signed   By: Andreas Newport M.D.   On: 08/16/2013 18:25        Scheduled Meds: . amLODipine  10 mg Oral q morning - 10a  . aspirin EC  81 mg Oral q morning - 10a  . brimonidine  1 drop Both Eyes TID   And  . timolol  1 drop Both Eyes TID  . carvedilol  3.125 mg Oral BID WC  . furosemide  40 mg Intravenous BID  . heparin  5,000 Units Subcutaneous 3 times per day  . insulin aspart  0-15 Units Subcutaneous TID WC  . [START ON 08/18/2013] pneumococcal 23 valent vaccine  0.5 mL Intramuscular Tomorrow-1000  . simvastatin  20 mg Oral q1800  . sodium chloride  3 mL Intravenous Q12H   Continuous Infusions: . nitroGLYCERIN Stopped (08/17/13 1103)    Principal  Problem:   Acute CHF (congestive heart failure) Active Problems:   DM2 (diabetes mellitus, type 2)   HTN (hypertension)   Acute on chronic renal insufficiency   Hypertensive urgency    Time spent: 40 minutes    Elease Etienne, MD, FACP, [facility]. [facility] Pager [phone]  If 7PM-7AM, please contact night-coverage [email or web address] Password [email or web address] 08/17/2013, 1:13 PM    LOS: 1 day

## 2013-08-17 NOTE — Progress Notes (Signed)
  Echocardiogram 2D Echocardiogram has been performed.  [name] 08/17/2013, 4:11 PM

## 2013-08-17 NOTE — Progress Notes (Signed)
SUBJECTIVE:  No complaints this am  OBJECTIVE:   Vitals:   Filed Vitals:   08/17/13 0500 08/17/13 0600 08/17/13 0700 08/17/13 0800  BP: 125/63 140/76 165/82   Pulse: 81 84 84   Temp:    98.7 F (37.1 C)  TempSrc:    Oral  Resp: 19 19 18    Height:      Weight: 139 lb 15.9 oz (63.5 kg)     SpO2: 91% 96% 95%    I&O's:   Intake/Output Summary (Last 24 hours) at 08/17/13 16100822 Last data filed at 08/17/13 0732  Gross per 24 hour  Intake  263.9 ml  Output   1775 ml  Net -1511.1 ml   TELEMETRY: Reviewed telemetry pt in NSR:     PHYSICAL EXAM General: Well developed, well nourished, in no acute distress Head: Eyes PERRLA, No xanthomas.   Normal cephalic and atramatic  Lungs:   Clear bilaterally to auscultation and percussion. Heart:   HRRR S1 S2 Pulses are 2+ & equal. Abdomen: Bowel sounds are positive, abdomen soft and non-tender without masses  Extremities:1-2+ edema Neuro: Alert and oriented X 3. Psych:  Good affect, responds appropriately   LABS: Basic Metabolic Panel:  Recent Labs  96/06/5403/31/15 1758 08/17/13 0139  NA 139 139  K 4.2 3.9  CL 106 107  CO2 21 22  GLUCOSE 69* 69*  BUN 29* 29*  CREATININE 2.81* 2.86*  CALCIUM 9.1 8.7  MG  --  2.2   Liver Function Tests:  Recent Labs  08/16/13 1758  AST 22  ALT 20  ALKPHOS 116  BILITOT <0.2*  PROT 6.3  ALBUMIN 2.6*   No results found for this basename: LIPASE, AMYLASE,  in the last 72 hours CBC:  Recent Labs  08/16/13 1758  WBC 5.5  HGB 11.5*  HCT 33.3*  MCV 83.5  PLT 268   Cardiac Enzymes:  Recent Labs  08/16/13 1758 08/16/13 1935 08/17/13 0136  TROPONINI 0.64* 0.63* 0.64*   BNP: No components found with this basename: POCBNP,  D-Dimer: No results found for this basename: DDIMER,  in the last 72 hours Hemoglobin A1C: No results found for this basename: HGBA1C,  in the last 72 hours Fasting Lipid Panel:  Recent Labs  08/17/13 0136  CHOL 215*  HDL 75  LDLCALC 120*  TRIG 98    CHOLHDL 2.9   Thyroid Function Tests:  Recent Labs  08/17/13 0136  TSH 1.440   Anemia Panel: No results found for this basename: VITAMINB12, FOLATE, FERRITIN, TIBC, IRON, RETICCTPCT,  in the last 72 hours Coag Panel:   No results found for this basename: INR, PROTIME    RADIOLOGY: Dg Chest 2 View  08/16/2013   CLINICAL DATA:  Short of breath.  Leg swelling.  EXAM: CHEST  2 VIEW  COMPARISON:  04/18/2011.  FINDINGS: Lung volumes are low. There is basilar atelectasis and bilateral pleural effusions. The effusions appear small. Pulmonary vascular congestion is present. Cholecystectomy clips are present in the right upper quadrant.  IMPRESSION: Findings compatible with mild CHF/ volume overload with bilateral pleural effusions and associated atelectasis.   Electronically Signed   By: [name] M.D.   On: 08/16/2013 18:25    Problem List  Acute heart failure  Mildly elevated Troponin in setting of heart failure and chronic kidney disease  Hypertensive Urgency  Hypertension - longstanding with LVH on ECG  Acute on chronic renal insufficiency/kidney disease  T2DM  Anemia  Dyslipidemia  RBBB on ECG  Assessment/Plan:  [name] is a 63 yo woman with PMH of T2DM, HTN, dyslipidemia, CKD who presents with 1 month of progressive SOB, lower extremity edema and more recently some intermittent chest pain. 1.  Acute CHF - 2D echo pending to assess LVF - start low dose beta blocker - continue IV Lasix 2.  Elevated troponin in the setting of CHF and CKD stage 4.  The troponins have been fairly flat with no trend upward so most likely secondary to CHF with CKD. 3.  Hypertensive urgency - BP improved overnight - continue Amlodipine - start low dose Coreg 3.125mg  BID 4.  Acute on chronic Renal insuff    Jordan Reichert, MD  08/17/2013  8:22 AM

## 2013-08-17 NOTE — Progress Notes (Signed)
CARE MANAGEMENT NOTE 08/17/2013  Patient:  [name], [name]   Account Number:  [id number]  Date Initiated:  08/17/2013  Documentation initiated by:  DAVIS,RHONDA  Subjective/Objective Assessment:   pt with chf now exacerbation and chest pain, reports several months of dyspnea     Action/Plan:   home when stable   Anticipated DC Date:  08/20/2013   Anticipated DC Plan:  HOME/SELF CARE  In-house referral  NA      DC Planning Services  NA      Choice offered to / List presented to:  NA   DME arranged  NA      DME agency  NA     HH arranged  NA      HH agency  NA   Status of service:  In process, will continue to follow Medicare Important Message given?  NA - LOS <3 / Initial given by admissions (If response is "NO", the following Medicare IM given date fields will be blank) Date Medicare IM given:   Date Additional Medicare IM given:    Discharge Disposition:    Per UR Regulation:  Reviewed for med. necessity/level of care/duration of stay  If discussed at Long Length of Stay Meetings, dates discussed:    Comments:  [id number]/Rhonda Stark Jock, BSN, [place] [phone] Chart Reviewed for discharge and hospital needs. Discharge needs at time of review: None present will follow for needs. Review of patient progress due on [id number].

## 2013-08-17 NOTE — Progress Notes (Signed)
Inpatient Diabetes Program Recommendations  AACE/ADA: New Consensus Statement on Inpatient Glycemic Control (2013)  Target Ranges:  Prepandial:   less than 140 mg/dL      Peak postprandial:   less than 180 mg/dL (1-2 hours)      Critically ill patients:  140 - 180 mg/dL   Reason for Visit: Hypoglycemia  Diabetes history: DM2 Outpatient Diabetes medications: Amaryl 4 mg QD Current orders for Inpatient glycemic control: Novolog moderate tidwc  Inpatient Diabetes Program Recommendations HgbA1C: Need updated HgbA1C to assess glycemic control prior to hospitalization  Note: Will follow while inpatient. Thank you. Ailene Ards, RD, LDN, CDE Inpatient Diabetes Coordinator [phone]

## 2013-08-18 DIAGNOSIS — I420 Dilated cardiomyopathy: Secondary | ICD-10-CM | POA: Diagnosis not present

## 2013-08-18 DIAGNOSIS — D649 Anemia, unspecified: Secondary | ICD-10-CM

## 2013-08-18 DIAGNOSIS — R0789 Other chest pain: Secondary | ICD-10-CM

## 2013-08-18 DIAGNOSIS — I5041 Acute combined systolic (congestive) and diastolic (congestive) heart failure: Secondary | ICD-10-CM

## 2013-08-18 LAB — BASIC METABOLIC PANEL
BUN: 34 mg/dL — AB (ref 6–23)
CO2: 24 mEq/L (ref 19–32)
CREATININE: 3.42 mg/dL — AB (ref 0.50–1.10)
Calcium: 8.6 mg/dL (ref 8.4–10.5)
Chloride: 105 mEq/L (ref 96–112)
GFR calc non Af Amer: 13 mL/min — ABNORMAL LOW (ref >90)
GFR, EST AFRICAN AMERICAN: 15 mL/min — AB (ref >90)
Glucose, Bld: 132 mg/dL — ABNORMAL HIGH (ref 70–99)
Potassium: 3.9 mEq/L (ref 3.7–5.3)
Sodium: 140 mEq/L (ref 137–147)

## 2013-08-18 LAB — CBC
HCT: 29 % — ABNORMAL LOW (ref 36.0–46.0)
Hemoglobin: 9.5 g/dL — ABNORMAL LOW (ref 12.0–15.0)
MCH: 27.5 pg (ref 26.0–34.0)
MCHC: 32.8 g/dL (ref 30.0–36.0)
MCV: 83.8 fL (ref 78.0–100.0)
PLATELETS: 249 10*3/uL (ref 150–400)
RBC: 3.46 MIL/uL — ABNORMAL LOW (ref 3.87–5.11)
RDW: 14.1 % (ref 11.5–15.5)
WBC: 4.3 10*3/uL (ref 4.0–10.5)

## 2013-08-18 LAB — RETICULOCYTES
RBC.: 3.52 MIL/uL — ABNORMAL LOW (ref 3.87–5.11)
RETIC COUNT ABSOLUTE: 52.8 10*3/uL (ref 19.0–186.0)
Retic Ct Pct: 1.5 % (ref 0.4–3.1)

## 2013-08-18 LAB — GLUCOSE, CAPILLARY
GLUCOSE-CAPILLARY: 153 mg/dL — AB (ref 70–99)
GLUCOSE-CAPILLARY: 144 mg/dL — AB (ref 70–99)
Glucose-Capillary: 138 mg/dL — ABNORMAL HIGH (ref 70–99)
Glucose-Capillary: 150 mg/dL — ABNORMAL HIGH (ref 70–99)

## 2013-08-18 LAB — FERRITIN: Ferritin: 85 ng/mL (ref 10–291)

## 2013-08-18 LAB — VITAMIN B12: Vitamin B-12: 467 pg/mL (ref 211–911)

## 2013-08-18 LAB — FOLATE: FOLATE: 13.5 ng/mL

## 2013-08-18 MED ORDER — CARVEDILOL 6.25 MG PO TABS
6.2500 mg | ORAL_TABLET | Freq: Two times a day (BID) | ORAL | Status: DC
  Administered 2013-08-18 – 2013-08-20 (×4): 6.25 mg via ORAL
  Filled 2013-08-18 (×6): qty 1

## 2013-08-18 MED ORDER — FUROSEMIDE 40 MG PO TABS
40.0000 mg | ORAL_TABLET | Freq: Every day | ORAL | Status: DC
  Filled 2013-08-18: qty 1

## 2013-08-18 NOTE — Progress Notes (Signed)
Came to visit patient at bedside to confirm Primary Care MD. She used to go to Dr Oneta Rack who was a [facility] Provider but no longer goes to him. Therefore, patient not eligible for [facility] Care Management at this current time.  Hyman Hopes- RN,BSN- [facility] [phone]

## 2013-08-18 NOTE — Progress Notes (Signed)
PROGRESS NOTE    [name] ZOX:[id number] DOB: 08/16/1950 DOA: 08/16/2013 PCP: Corey Harold, MD  HPI/Brief narrative 63 year old female with history of HTN, DM 2, CKD, HLD, anemia presented to the ED on 08/16/13 with one month history of dyspnea, orthopnea, bilateral leg edema and chest discomfort. Patient was found to be in decompensated CHF and mildly elevated troponins likely secondary to decompensated CHF in the context of chronic kidney disease. Cardiology seeing. CHF improved. She was transferred from start down to telemetry on 6/1. Creatinine has increased from 2.8-3.4. Holding Lasix.   Assessment/Plan:  1. Acute combined systolic and diastolic CHF: Admitted to step down unit. Started on IV Lasix and IV NTG. Clinically improved. Cardiology followup appreciated. Holding all Lasix 6/2 secondary to worsening renal functions. Increasing Coreg. Cannot use ACE inhibitor/ARB secondary to renal insufficiency. Echo: LVEF 45-50% and grade 1 diastolic dysfunction. 2. Chest discomfort/elevated troponin in the setting of acute CHF and CKD: Cardiology does not believe patient has an MI and elevated troponin most likely secondary to CHF with CKD. Chest discomfort has resolved. Beta blockers being titrated. Outpatient nuclear stress test. Not a candidate for cardiac cath due to renal insufficiency and high likelihood of worsening with contrast. 3. Hypertensive urgency: Improved. Continue amlodipine. Started and titrating Coreg. NTG infusion discontinued 6/1. 4. Acute on Stage IV chronic kidney disease: Likely secondary to diabetic nephropathy +/- hypertensive nephrosclerosis. Creatinine in August 2014 was 1.82. Current worsening of creatinine likely secondary to diuresis. Lasix held. Follow BMP in a.m. 5. Type II DM with renal complications/hypoglycemia: Patient had blood glucose of 67 on labs on 6/1 morning. Changed sliding scale to sensitive. Monitor closely. No further hypoglycemic  episodes. 6. Anemia: Likely secondary to chronic kidney disease. No reported bleeding. Follow CBC in a.m. Check anemia panel. 7. Hyperlipidemia: LDL 120. Continue statins   Code Status: Full Family Communication: none at bedside Disposition Plan: Remains inpatient. Not medically stable for discharge.   Consultants:  Cardiology  Procedures:  None  Antibiotics:  None   Subjective: Denies dyspnea, orthopnea and leg swellings improving. Denies chest pain. States that she slept well last night.  Objective: Filed Vitals:   08/17/13 1600 08/17/13 1630 08/17/13 2017 08/18/13 0556  BP:  129/90 157/97 152/90  Pulse: 81 85 90 83  Temp:   98.3 F (36.8 C) 98.2 F (36.8 C)  TempSrc:   Oral Oral  Resp: 23 20 20 20   Height:  5\' 1"  (1.549 m)    Weight:  59.8 kg (131 lb 13.4 oz)  60.3 kg (132 lb 15 oz)  SpO2: 93% 97% 94% 97%    Intake/Output Summary (Last 24 hours) at 08/18/13 1106 Last data filed at 08/18/13 0804  Gross per 24 hour  Intake    480 ml  Output    700 ml  Net   -220 ml   Filed Weights   08/17/13 0500 08/17/13 1630 08/18/13 0556  Weight: 63.5 kg (139 lb 15.9 oz) 59.8 kg (131 lb 13.4 oz) 60.3 kg (132 lb 15 oz)     Exam:  General exam: Pleasant middle-aged female sitting up in bed comfortably. Respiratory system: Occasional basal crackles but otherwise clear to auscultation. No increased work of breathing. Cardiovascular system: S1 & S2 heard, RRR. No murmurs, gallops, clicks . No JVD. Trace bilateral leg edema. Telemetry: sinus rhythm. Gastrointestinal system: Abdomen is nondistended, soft and nontender. Normal bowel sounds heard. Central nervous system: Alert and oriented. No focal neurological deficits. Extremities: Symmetric 5 x 5 power.  Data Reviewed: Basic Metabolic Panel:  Recent Labs Lab 08/16/13 1758 08/17/13 0139 08/18/13 0350  NA 139 139 140  K 4.2 3.9 3.9  CL 106 107 105  CO2 21 22 24   GLUCOSE 69* 69* 132*  BUN 29* 29* 34*   CREATININE 2.81* 2.86* 3.42*  CALCIUM 9.1 8.7 8.6  MG  --  2.2  --    Liver Function Tests:  Recent Labs Lab 08/16/13 1758  AST 22  ALT 20  ALKPHOS 116  BILITOT <0.2*  PROT 6.3  ALBUMIN 2.6*   No results found for this basename: LIPASE, AMYLASE,  in the last 168 hours No results found for this basename: AMMONIA,  in the last 168 hours CBC:  Recent Labs Lab 08/16/13 1758 08/18/13 0350  WBC 5.5 4.3  HGB 11.5* 9.5*  HCT 33.3* 29.0*  MCV 83.5 83.8  PLT 268 249   Cardiac Enzymes:  Recent Labs Lab 08/16/13 1758 08/16/13 1935 08/17/13 0136 08/17/13 0731  TROPONINI 0.64* 0.63* 0.64* 0.65*   BNP (last 3 results)  Recent Labs  08/16/13 1758  PROBNP 15783.0*   CBG:  Recent Labs Lab 08/17/13 0818 08/17/13 1223 08/17/13 1705 08/17/13 2203 08/18/13 0722  GLUCAP 95 100* 119* 138* 138*    Recent Results (from the past 240 hour(s))  MRSA PCR SCREENING     Status: None   Collection Time    08/16/13  8:20 PM      Result Value Ref Range Status   MRSA by PCR NEGATIVE  NEGATIVE Final   Comment:            The GeneXpert MRSA Assay (FDA     approved for NASAL specimens     only), is one component of a     comprehensive MRSA colonization     surveillance program. It is not     intended to diagnose MRSA     infection nor to guide or     monitor treatment for     MRSA infections.      Additional labs: 1. Fasting lipids: Cholesterol 215, triglycerides 98, HDL 75, LDL 120, VLDL 20 2. 2-D echo 08/17/13: Study Conclusions  - Left ventricle: The cavity size was normal. Wall thickness was increased in a pattern of mild LVH. Systolic function was mildly reduced. The estimated ejection fraction was in the range of 45% to 50%. Wall motion was normal; there were no regional wall motion abnormalities. Doppler parameters are consistent with abnormal left ventricular relaxation (grade 1 diastolic dysfunction). Doppler parameters are consistent with high ventricular  filling pressure.       Studies: Dg Chest 2 View  08/16/2013   CLINICAL DATA:  Short of breath.  Leg swelling.  EXAM: CHEST  2 VIEW  COMPARISON:  04/18/2011.  FINDINGS: Lung volumes are low. There is basilar atelectasis and bilateral pleural effusions. The effusions appear small. Pulmonary vascular congestion is present. Cholecystectomy clips are present in the right upper quadrant.  IMPRESSION: Findings compatible with mild CHF/ volume overload with bilateral pleural effusions and associated atelectasis.   Electronically Signed   By: Andreas NewportGeoffrey  Lamke M.D.   On: 08/16/2013 18:25        Scheduled Meds: . amLODipine  10 mg Oral q morning - 10a  . aspirin EC  81 mg Oral q morning - 10a  . brimonidine  1 drop Both Eyes TID   And  . timolol  1 drop Both Eyes TID  . carvedilol  6.25 mg Oral BID  WC  . heparin  5,000 Units Subcutaneous 3 times per day  . insulin aspart  0-9 Units Subcutaneous TID WC  . simvastatin  20 mg Oral q1800   Continuous Infusions:    Principal Problem:   Acute combined systolic and diastolic heart failure Active Problems:   DM2 (diabetes mellitus, type 2)   HTN (hypertension)   Hypertensive urgency   Chest discomfort   Chronic kidney disease (CKD), stage IV (severe)   Uncontrolled type 2 diabetes mellitus with stage 4 chronic kidney disease   DCM (dilated cardiomyopathy)    Time spent: 40 minutes    Elease Etienne, MD, FACP, [facility]. [facility] Pager [phone]  If 7PM-7AM, please contact night-coverage [email or web address] Password [email or web address] 08/18/2013, 11:06 AM    LOS: 2 days

## 2013-08-18 NOTE — Progress Notes (Signed)
SUBJECTIVE:  No complaints  OBJECTIVE:   Vitals:   Filed Vitals:   08/17/13 1600 08/17/13 1630 08/17/13 2017 08/18/13 0556  BP:  129/90 157/97 152/90  Pulse: 81 85 90 83  Temp:   98.3 F (36.8 C) 98.2 F (36.8 C)  TempSrc:   Oral Oral  Resp: 23 20 20 20   Height:  5\' 1"  (1.549 m)    Weight:  131 lb 13.4 oz (59.8 kg)  132 lb 15 oz (60.3 kg)  SpO2: 93% 97% 94% 97%   I&O's:   Intake/Output Summary (Last 24 hours) at 08/18/13 0817 Last data filed at 08/18/13 0804  Gross per 24 hour  Intake 515.08 ml  Output   1300 ml  Net -784.92 ml   TELEMETRY: Reviewed telemetry pt in NSR:     PHYSICAL EXAM General: Well developed, well nourished, in no acute distress Head: Eyes PERRLA, No xanthomas.   Normal cephalic and atramatic  Lungs:   Clear bilaterally to auscultation and percussion. Heart:   HRRR S1 S2 Pulses are 2+ & equal. Abdomen: Bowel sounds are positive, abdomen soft and non-tender without masses Extremities:   No clubbing, cyanosis or edema.  DP +1 Neuro: Alert and oriented X 3. Psych:  Good affect, responds appropriately   LABS: Basic Metabolic Panel:  Recent Labs  16/12/9603/31/15 1758 08/17/13 0139 08/18/13 0350  NA 139 139 140  K 4.2 3.9 3.9  CL 106 107 105  CO2 21 22 24   GLUCOSE 69* 69* 132*  BUN 29* 29* 34*  CREATININE 2.81* 2.86* 3.42*  CALCIUM 9.1 8.7 8.6  MG  --  2.2  --    Liver Function Tests:  Recent Labs  08/16/13 1758  AST 22  ALT 20  ALKPHOS 116  BILITOT <0.2*  PROT 6.3  ALBUMIN 2.6*   No results found for this basename: LIPASE, AMYLASE,  in the last 72 hours CBC:  Recent Labs  08/16/13 1758 08/18/13 0350  WBC 5.5 4.3  HGB 11.5* 9.5*  HCT 33.3* 29.0*  MCV 83.5 83.8  PLT 268 249   Cardiac Enzymes:  Recent Labs  08/16/13 1935 08/17/13 0136 08/17/13 0731  TROPONINI 0.63* 0.64* 0.65*   BNP: No components found with this basename: POCBNP,  D-Dimer: No results found for this basename: DDIMER,  in the last 72  hours Hemoglobin A1C:  Recent Labs  08/17/13 0139  HGBA1C 7.6*   Fasting Lipid Panel:  Recent Labs  08/17/13 0136  CHOL 215*  HDL 75  LDLCALC 120*  TRIG 98  CHOLHDL 2.9   Thyroid Function Tests:  Recent Labs  08/17/13 0136  TSH 1.440   Anemia Panel: No results found for this basename: VITAMINB12, FOLATE, FERRITIN, TIBC, IRON, RETICCTPCT,  in the last 72 hours Coag Panel:   No results found for this basename: INR, PROTIME    RADIOLOGY: Dg Chest 2 View  08/16/2013   CLINICAL DATA:  Short of breath.  Leg swelling.  EXAM: CHEST  2 VIEW  COMPARISON:  04/18/2011.  FINDINGS: Lung volumes are low. There is basilar atelectasis and bilateral pleural effusions. The effusions appear small. Pulmonary vascular congestion is present. Cholecystectomy clips are present in the right upper quadrant.  IMPRESSION: Findings compatible with mild CHF/ volume overload with bilateral pleural effusions and associated atelectasis.   Electronically Signed   By: Andreas NewportGeoffrey  Lamke M.D.   On: 08/16/2013 18:25    Problem List  Acute heart failure  Mildly elevated Troponin in setting of heart failure and  chronic kidney disease  Hypertensive Urgency  Hypertension - longstanding with LVH on ECG  Acute on chronic renal insufficiency/kidney disease  T2DM  Anemia  Dyslipidemia  RBBB on ECG   Assessment/Plan:  [name] is a 63 yo woman with PMH of T2DM, HTN, dyslipidemia, CKD who presents with 1 month of progressive SOB, lower extremity edema and more recently some intermittent chest pain.   1. Acute combined systolic/diastolic CHF - 2D echo showed mild LV dysfunction EF 45-50%. No RWMA's. - Continue beta blocker - hold diuretic due to worsening kidney function - no ACE I secondary to CKD 2. Elevated troponin in the setting of CHF and CKD stage 4. The troponins have been fairly flat with no trend upward so most likely secondary to CHF with CKD.  - at this time she is not a candidate for cath given  her worsening kidney function that would have a high liklihood of progression if she was subjected to contrast dye.  Once acute illness improves would recommend outpatient nuclear stress test but would only consider cath if stress test very high risk.   - continue ASA 3. Hypertensive urgency - BP improved but still not well controlled - continue Amlodipine  - Increase Coreg to 6.25mg  BID  4. Acute on chronic Renal insuff - creatinine worse today - stop Lasix  - consider renal consult   Quintella Reichert, MD  08/18/2013  8:17 AM

## 2013-08-19 DIAGNOSIS — I5041 Acute combined systolic (congestive) and diastolic (congestive) heart failure: Secondary | ICD-10-CM | POA: Diagnosis present

## 2013-08-19 DIAGNOSIS — I428 Other cardiomyopathies: Secondary | ICD-10-CM

## 2013-08-19 DIAGNOSIS — E785 Hyperlipidemia, unspecified: Secondary | ICD-10-CM

## 2013-08-19 DIAGNOSIS — N179 Acute kidney failure, unspecified: Secondary | ICD-10-CM

## 2013-08-19 LAB — CBC
HCT: 29.2 % — ABNORMAL LOW (ref 36.0–46.0)
HEMOGLOBIN: 9.7 g/dL — AB (ref 12.0–15.0)
MCH: 27.7 pg (ref 26.0–34.0)
MCHC: 33.2 g/dL (ref 30.0–36.0)
MCV: 83.4 fL (ref 78.0–100.0)
Platelets: 240 10*3/uL (ref 150–400)
RBC: 3.5 MIL/uL — AB (ref 3.87–5.11)
RDW: 13.7 % (ref 11.5–15.5)
WBC: 5.3 10*3/uL (ref 4.0–10.5)

## 2013-08-19 LAB — GLUCOSE, CAPILLARY
GLUCOSE-CAPILLARY: 126 mg/dL — AB (ref 70–99)
Glucose-Capillary: 160 mg/dL — ABNORMAL HIGH (ref 70–99)
Glucose-Capillary: 113 mg/dL — ABNORMAL HIGH (ref 70–99)

## 2013-08-19 LAB — BASIC METABOLIC PANEL
BUN: 41 mg/dL — ABNORMAL HIGH (ref 6–23)
CHLORIDE: 103 meq/L (ref 96–112)
CO2: 24 meq/L (ref 19–32)
Calcium: 8.4 mg/dL (ref 8.4–10.5)
Creatinine, Ser: 3.5 mg/dL — ABNORMAL HIGH (ref 0.50–1.10)
GFR calc Af Amer: 15 mL/min — ABNORMAL LOW (ref >90)
GFR calc non Af Amer: 13 mL/min — ABNORMAL LOW (ref >90)
GLUCOSE: 137 mg/dL — AB (ref 70–99)
Potassium: 3.9 mEq/L (ref 3.7–5.3)
SODIUM: 138 meq/L (ref 137–147)

## 2013-08-19 LAB — IRON AND TIBC
IRON: 31 ug/dL — AB (ref 42–135)
Saturation Ratios: 14 % — ABNORMAL LOW (ref 20–55)
TIBC: 220 ug/dL — AB (ref 250–470)
UIBC: 189 ug/dL (ref 125–400)

## 2013-08-19 NOTE — Care Management Note (Addendum)
    Page 1 of 2   08/20/2013     1:43:58 PM CARE MANAGEMENT NOTE 08/20/2013  Patient:  [name]   Account Number:  [id number]  Date Initiated:  08/17/2013  Documentation initiated by:  DAVIS,RHONDA  Subjective/Objective Assessment:   pt with chf now exacerbation and chest pain, reports several months of dyspnea     Action/Plan:   home when stable   Anticipated DC Date:  08/20/2013   Anticipated DC Plan:  HOME/SELF CARE  In-house referral  NA      DC Planning Services  CM consult      Choice offered to / List presented to:  C-1 Patient   DME arranged  NA      DME agency  NA     HH arranged  NA      HH agency  NA   Status of service:  Completed, signed off Medicare Important Message given?  YES (If response is "NO", the following Medicare IM given date fields will be blank) Date Medicare IM given:  08/20/2013 Date Additional Medicare IM given:    Discharge Disposition:  HOME/SELF CARE  Per UR Regulation:  Reviewed for med. necessity/level of care/duration of stay  If discussed at Long Length of Stay Meetings, dates discussed:    Comments:  08/19/13 Mozes Sagar RN,BSN NCM [phone] PATIENT WORKS & IS NOT HOMEBOUND.NO HHC NEEDED.NO ANTICIPATED D/C NEEDS.  [id number] Earlene Plater, RN, BSN, [place] [phone] Chart Reviewed for discharge and hospital needs. Discharge needs at time of review: None present will follow for needs. Review of patient progress due on [id number].

## 2013-08-19 NOTE — Progress Notes (Signed)
[name] Progress Note  [name] UUV:[id number] DOB: [birth year] DOA: 08/16/2013 PCP: Corey Harold, MD  Admit HPI / Brief Narrative: 45QQ VZ PMHx HTN, DM 2, CKD, HLD, anemia presented to the ED on 08/16/13 with one month history of dyspnea, orthopnea, bilateral leg edema and chest discomfort. Patient was found to be in decompensated CHF and mildly elevated troponins likely secondary to decompensated CHF in the context of chronic kidney disease. Cardiology seeing. CHF improved. She was transferred from start down to telemetry on 6/1. Creatinine has increased from 2.8-3.4. Holding Lasix   HPI/Subjective: 6/30 no complaints  Assessment/Plan: Acute combined systolic and diastolic CHF: - Continue beta blocker  - Diuretic on hold due to worsening kidney function  - no ACE I secondary to CKD   Chest discomfort/elevated troponin in the setting of acute CHF and CKD -at this time she is not a candidate for cath given her worsening kidney function that would have a high liklihood of progression if she was subjected to contrast dye. Once acute illness improves would recommend outpatient nuclear stress test but would only consider cath if stress test very high risk.  - continue ASA   Hypertensive urgency:  -I- continue Amlodipine  - Coreg increased to 6.25mg  BID yesterday  - will see how BP does today and consider increasing further if needed  Acute on Stage IV chronic kidney disease:  - Lasix stopped yesterday  -If no improvement in renal function consider renal consult  Type II DM with renal complications/hypoglycemia: -CBGs stable -Continue sensitive SSI   Anemia:  -Stable most likely secondary to chronic kidney disease.   Hyperlipidemia:  -LDL 120. Continue statins     Code Status: FULL Family Communication: no family present at time of exam Disposition Plan: Per cardiology    Consultants: Dr. Armanda Magic (cardiology)  Procedure/Significant  Events: 6/1 echocardiogram - Left ventricle: mild LVH. -LVEF=45%-50%. - (grade 1 diastolic dysfunction).    Culture NA  Antibiotics: NA  DVT prophylaxis: Heparin   Devices NA   LINES / TUBES:      Continuous Infusions:   Objective: VITAL SIGNS: Temp: 97.7 F (36.5 C) (06/03 1502) Temp src: Oral (06/03 1502) BP: 131/71 mmHg (06/03 1502) Pulse Rate: 72 (06/03 1502) SPO2; FIO2:   Intake/Output Summary (Last 24 hours) at 08/19/13 2011 Last data filed at 08/19/13 1824  Gross per 24 hour  Intake    360 ml  Output    851 ml  Net   -491 ml     Exam: General: A./O. x4, NAD No acute respiratory distress Lungs: Clear to auscultation bilaterally without wheezes or crackles Cardiovascular: Regular rate and rhythm without murmur gallop or rub normal S1 and S2 Abdomen: Nontender, nondistended, soft, bowel sounds positive, no rebound, no ascites, no appreciable mass Extremities: No significant cyanosis, clubbing, or edema bilateral lower extremities  Data Reviewed: Basic Metabolic Panel:  Recent Labs Lab 08/16/13 1758 08/17/13 0139 08/18/13 0350 08/19/13 0320  NA 139 139 140 138  K 4.2 3.9 3.9 3.9  CL 106 107 105 103  CO2 21 22 24 24   GLUCOSE 69* 69* 132* 137*  BUN 29* 29* 34* 41*  CREATININE 2.81* 2.86* 3.42* 3.50*  CALCIUM 9.1 8.7 8.6 8.4  MG  --  2.2  --   --    Liver Function Tests:  Recent Labs Lab 08/16/13 1758  AST 22  ALT 20  ALKPHOS 116  BILITOT <0.2*  PROT 6.3  ALBUMIN 2.6*  No results found for this basename: LIPASE, AMYLASE,  in the last 168 hours No results found for this basename: AMMONIA,  in the last 168 hours CBC:  Recent Labs Lab 08/16/13 1758 08/18/13 0350 08/19/13 0320  WBC 5.5 4.3 5.3  HGB 11.5* 9.5* 9.7*  HCT 33.3* 29.0* 29.2*  MCV 83.5 83.8 83.4  PLT 268 249 240   Cardiac Enzymes:  Recent Labs Lab 08/16/13 1758 08/16/13 1935 08/17/13 0136 08/17/13 0731  TROPONINI 0.64* 0.63* 0.64* 0.65*   BNP (last 3  results)  Recent Labs  08/16/13 1758  PROBNP 15783.0*   CBG:  Recent Labs Lab 08/18/13 1655 08/18/13 2116 08/19/13 0736 08/19/13 1200 08/19/13 1701  GLUCAP 150* 144* 126* 160* 113*    Recent Results (from the past 240 hour(s))  MRSA PCR SCREENING     Status: None   Collection Time    08/16/13  8:20 PM      Result Value Ref Range Status   MRSA by PCR NEGATIVE  NEGATIVE Final   Comment:            The GeneXpert MRSA Assay (FDA     approved for NASAL specimens     only), is one component of a     comprehensive MRSA colonization     surveillance program. It is not     intended to diagnose MRSA     infection nor to guide or     monitor treatment for     MRSA infections.     Studies:  Recent x-ray studies have been reviewed in detail by the Attending Physician  Scheduled Meds:  Scheduled Meds: . amLODipine  10 mg Oral q morning - 10a  . aspirin EC  81 mg Oral q morning - 10a  . brimonidine  1 drop Both Eyes TID   And  . timolol  1 drop Both Eyes TID  . carvedilol  6.25 mg Oral BID WC  . heparin  5,000 Units Subcutaneous 3 times per day  . insulin aspart  0-9 Units Subcutaneous TID WC  . simvastatin  20 mg Oral q1800    Time spent on care of this patient: 40 mins   Drema Dallasurtis J Estel Scholze , MD   [facility] Office  [phone] Pager [phone]  On-Call/Text Page:      [email or web address]      password [email or web address]  If 7PM-7AM, please contact night-coverage [email or web address] Password [email or web address] 08/19/2013, 8:11 PM   LOS: 3 days

## 2013-08-19 NOTE — Progress Notes (Signed)
SUBJECTIVE:  No complaints  OBJECTIVE:   Vitals:   Filed Vitals:   08/18/13 0556 08/18/13 1355 08/18/13 2113 08/19/13 0611  BP: 152/90 155/93 140/78 150/86  Pulse: 83 74 79 84  Temp: 98.2 F (36.8 C) 98 F (36.7 C) 98.2 F (36.8 C) 98.2 F (36.8 C)  TempSrc: Oral Oral Oral Oral  Resp: 20 20 20 20   Height:      Weight: 132 lb 15 oz (60.3 kg)   134 lb 9.6 oz (61.054 kg)  SpO2: 97% 100% 100% 100%   I&O's:   Intake/Output Summary (Last 24 hours) at 08/19/13 0916 Last data filed at 08/19/13 [id number]  Gross per 24 hour  Intake    240 ml  Output    950 ml  Net   -710 ml   TELEMETRY: Reviewed telemetry pt in NSR:     PHYSICAL EXAM General: Well developed, well nourished, in no acute distress Head: Eyes PERRLA, No xanthomas.   Normal cephalic and atramatic  Lungs:   Clear bilaterally to auscultation and percussion. Heart:   HRRR S1 S2 Pulses are 2+ & equal. Abdomen: Bowel sounds are positive, abdomen soft and non-tender without masses Extremities:   No clubbing, cyanosis or edema.  DP +1 Neuro: Alert and oriented X 3. Psych:  Good affect, responds appropriately   LABS: Basic Metabolic Panel:  Recent Labs  11/91/4705/31/15 1758 08/17/13 0139 08/18/13 0350 08/19/13 0320  NA 139 139 140 138  K 4.2 3.9 3.9 3.9  CL 106 107 105 103  CO2 21 22 24 24   GLUCOSE 69* 69* 132* 137*  BUN 29* 29* 34* 41*  CREATININE 2.81* 2.86* 3.42* 3.50*  CALCIUM 9.1 8.7 8.6 8.4  MG  --  2.2  --   --    Liver Function Tests:  Recent Labs  08/16/13 1758  AST 22  ALT 20  ALKPHOS 116  BILITOT <0.2*  PROT 6.3  ALBUMIN 2.6*   No results found for this basename: LIPASE, AMYLASE,  in the last 72 hours CBC:  Recent Labs  08/18/13 0350 08/19/13 0320  WBC 4.3 5.3  HGB 9.5* 9.7*  HCT 29.0* 29.2*  MCV 83.8 83.4  PLT 249 240   Cardiac Enzymes:  Recent Labs  08/16/13 1935 08/17/13 0136 08/17/13 0731  TROPONINI 0.63* 0.64* 0.65*   BNP: No components found with this basename: POCBNP,    D-Dimer: No results found for this basename: DDIMER,  in the last 72 hours Hemoglobin A1C:  Recent Labs  08/17/13 0139  HGBA1C 7.6*   Fasting Lipid Panel:  Recent Labs  08/17/13 0136  CHOL 215*  HDL 75  LDLCALC 120*  TRIG 98  CHOLHDL 2.9   Thyroid Function Tests:  Recent Labs  08/17/13 0136  TSH 1.440   Anemia Panel:  Recent Labs  08/18/13 1211  VITAMINB12 467  FOLATE 13.5  FERRITIN 85  TIBC 220*  IRON 31*  RETICCTPCT 1.5   Coag Panel:   No results found for this basename: INR, PROTIME    RADIOLOGY: Dg Chest 2 View  08/16/2013   CLINICAL DATA:  Short of breath.  Leg swelling.  EXAM: CHEST  2 VIEW  COMPARISON:  04/18/2011.  FINDINGS: Lung volumes are low. There is basilar atelectasis and bilateral pleural effusions. The effusions appear small. Pulmonary vascular congestion is present. Cholecystectomy clips are present in the right upper quadrant.  IMPRESSION: Findings compatible with mild CHF/ volume overload with bilateral pleural effusions and associated atelectasis.   Electronically Signed  By: Andreas Newport M.D.   On: 08/16/2013 18:25   Problem List  Acute heart failure  Mildly elevated Troponin in setting of heart failure and chronic kidney disease  Hypertensive Urgency  Hypertension - longstanding with LVH on ECG  Acute on chronic renal insufficiency/kidney disease  T2DM  Anemia  Dyslipidemia  RBBB on ECG   Assessment/Plan:  Ms. Zeger is a 63 yo woman with PMH of T2DM, HTN, dyslipidemia, CKD who presents with 1 month of progressive SOB, lower extremity edema and more recently some intermittent chest pain.   1. Acute combined systolic/diastolic CHF - 2D echo showed mild LV dysfunction EF 45-50%. No RWMA's.  - Continue beta blocker  - Diuretic on hold due to worsening kidney function  - no ACE I secondary to CKD  2. Elevated troponin in the setting of CHF and CKD stage 4. The troponins have been fairly flat with no trend upward so most likely  secondary to CHF with CKD.  - at this time she is not a candidate for cath given her worsening kidney function that would have a high liklihood of progression if she was subjected to contrast dye. Once acute illness improves would recommend outpatient nuclear stress test but would only consider cath if stress test very high risk.  - continue ASA  3. Hypertensive urgency - BP improved but still not well controlled  - continue Amlodipine  - Coreg increased to 6.25mg  BID yesterday - will see how BP does today and consider increasing further if needed 4. Acute on chronic Renal insuff - creatinine worse today  -  Lasix stopped yesterday - consider renal consult     Quintella Reichert, MD  08/19/2013  9:16 AM

## 2013-08-20 LAB — BASIC METABOLIC PANEL
BUN: 44 mg/dL — ABNORMAL HIGH (ref 6–23)
CHLORIDE: 103 meq/L (ref 96–112)
CO2: 20 meq/L (ref 19–32)
Calcium: 8.8 mg/dL (ref 8.4–10.5)
Creatinine, Ser: 3.28 mg/dL — ABNORMAL HIGH (ref 0.50–1.10)
GFR calc Af Amer: 16 mL/min — ABNORMAL LOW (ref >90)
GFR calc non Af Amer: 14 mL/min — ABNORMAL LOW (ref >90)
Glucose, Bld: 159 mg/dL — ABNORMAL HIGH (ref 70–99)
POTASSIUM: 4.3 meq/L (ref 3.7–5.3)
SODIUM: 138 meq/L (ref 137–147)

## 2013-08-20 LAB — GLUCOSE, CAPILLARY
GLUCOSE-CAPILLARY: 142 mg/dL — AB (ref 70–99)
Glucose-Capillary: 127 mg/dL — ABNORMAL HIGH (ref 70–99)
Glucose-Capillary: 105 mg/dL — ABNORMAL HIGH (ref 70–99)

## 2013-08-20 LAB — PRO B NATRIURETIC PEPTIDE: Pro B Natriuretic peptide (BNP): 8385 pg/mL — ABNORMAL HIGH (ref 0–125)

## 2013-08-20 MED ORDER — FUROSEMIDE 20 MG PO TABS: 20.0000 mg | ORAL_TABLET | Freq: Every day | ORAL | Status: DC

## 2013-08-20 MED ORDER — CARVEDILOL 6.25 MG PO TABS: 6.2500 mg | ORAL_TABLET | Freq: Two times a day (BID) | ORAL | Status: DC

## 2013-08-20 NOTE — Discharge Instructions (Signed)
Heart Failure °Heart failure is a condition in which the heart has trouble pumping blood. This means your heart does not pump blood efficiently for your body to work well. In some cases of heart failure, fluid may back up into your lungs or you may have swelling (edema) in your lower legs. Heart failure is usually a long-term (chronic) condition. It is important for you to take good care of yourself and follow your caregiver's treatment plan. °CAUSES  °Some health conditions can cause heart failure. Those health conditions include: °· High blood pressure (hypertension) causes the heart muscle to work harder than normal. When pressure in the blood vessels is high, the heart needs to pump (contract) with more force in order to circulate blood throughout the body. High blood pressure eventually causes the heart to become stiff and weak. °· Coronary artery disease (CAD) is the buildup of cholesterol and fat (plaque) in the arteries of the heart. The blockage in the arteries deprives the heart muscle of oxygen and blood. This can cause chest pain and may lead to a heart attack. High blood pressure can also contribute to CAD. °· Heart attack (myocardial infarction) occurs when 1 or more arteries in the heart become blocked. The loss of oxygen damages the muscle tissue of the heart. When this happens, part of the heart muscle dies. The injured tissue does not contract as well and weakens the heart's ability to pump blood. °· Abnormal heart valves can cause heart failure when the heart valves do not open and close properly. This makes the heart muscle pump harder to keep the blood flowing. °· Heart muscle disease (cardiomyopathy or myocarditis) is damage to the heart muscle from a variety of causes. These can include drug or alcohol abuse, infections, or unknown reasons. These can increase the risk of heart failure. °· Lung disease makes the heart work harder because the lungs do not work properly. This can cause a strain  on the heart, leading it to fail. °· Diabetes increases the risk of heart failure. High blood sugar contributes to high fat (lipid) levels in the blood. Diabetes can also cause slow damage to tiny blood vessels that carry important nutrients to the heart muscle. When the heart does not get enough oxygen and food, it can cause the heart to become weak and stiff. This leads to a heart that does not contract efficiently. °· Other conditions can contribute to heart failure. These include abnormal heart rhythms, thyroid problems, and low blood counts (anemia). °Certain unhealthy behaviors can increase the risk of heart failure. Those unhealthy behaviors include: °· Being overweight. °· Smoking or chewing tobacco. °· Eating foods high in fat and cholesterol. °· Abusing illicit drugs or alcohol. °· Lacking physical activity. °SYMPTOMS  °Heart failure symptoms may vary and can be hard to detect. Symptoms may include: °· Shortness of breath with activity, such as climbing stairs. °· Persistent cough. °· Swelling of the feet, ankles, legs, or abdomen. °· Unexplained weight gain. °· Difficulty breathing when lying flat (orthopnea). °· Waking from sleep because of the need to sit up and get more air. °· Rapid heartbeat. °· Fatigue and loss of energy. °· Feeling lightheaded, dizzy, or close to fainting. °· Loss of appetite. °· Nausea. °· Increased urination during the night (nocturia). °DIAGNOSIS  °A diagnosis of heart failure is based on your history, symptoms, physical examination, and diagnostic tests. °Diagnostic tests for heart failure may include: °· Echocardiography. °· Electrocardiography. °· Chest X-ray. °· Blood tests. °· Exercise   stress test. °· Cardiac angiography. °· Radionuclide scans. °TREATMENT  °Treatment is aimed at managing the symptoms of heart failure. Medicines, behavioral changes, or surgical intervention may be necessary to treat heart failure. °· Medicines to help treat heart failure may  include: °· Angiotensin-converting enzyme (ACE) inhibitors. This type of medicine blocks the effects of a blood protein called angiotensin-converting enzyme. ACE inhibitors relax (dilate) the blood vessels and help lower blood pressure. °· Angiotensin receptor blockers. This type of medicine blocks the actions of a blood protein called angiotensin. Angiotensin receptor blockers dilate the blood vessels and help lower blood pressure. °· Water pills (diuretics). Diuretics cause the kidneys to remove salt and water from the blood. The extra fluid is removed through urination. This loss of extra fluid lowers the volume of blood the heart pumps. °· Beta blockers. These prevent the heart from beating too fast and improve heart muscle strength. °· Digitalis. This increases the force of the heartbeat. °· Healthy behavior changes include: °· Obtaining and maintaining a healthy weight. °· Stopping smoking or chewing tobacco. °· Eating heart healthy foods. °· Limiting or avoiding alcohol. °· Stopping illicit drug use. °· Physical activity as directed by your caregiver. °· Surgical treatment for heart failure may include: °· A procedure to open blocked arteries, repair damaged heart valves, or remove damaged heart muscle tissue. °· A pacemaker to improve heart muscle function and control certain abnormal heart rhythms. °· An internal cardioverter defibrillator to treat certain serious abnormal heart rhythms. °· A left ventricular assist device to assist the pumping ability of the heart. °HOME CARE INSTRUCTIONS  °· Take your medicine as directed by your caregiver. Medicines are important in reducing the workload of your heart, slowing the progression of heart failure, and improving your symptoms. °· Do not stop taking your medicine unless directed by your caregiver. °· Do not skip any dose of medicine. °· Refill your prescriptions before you run out of medicine. Your medicines are needed every day. °· Take over-the-counter  medicine only as directed by your caregiver or pharmacist. °· Engage in moderate physical activity if directed by your caregiver. Moderate physical activity can benefit some people. The elderly and people with severe heart failure should consult with a caregiver for physical activity recommendations. °· Eat heart healthy foods. Food choices should be free of trans fat and low in saturated fat, cholesterol, and salt (sodium). Healthy choices include fresh or frozen fruits and vegetables, fish, lean meats, legumes, fat-free or low-fat dairy products, and whole grain or high fiber foods. Talk to a dietitian to learn more about heart healthy foods. °· Limit sodium if directed by your caregiver. Sodium restriction may reduce symptoms of heart failure in some people. Talk to a dietitian to learn more about heart healthy seasonings. °· Use healthy cooking methods. Healthy cooking methods include roasting, grilling, broiling, baking, poaching, steaming, or stir-frying. Talk to a dietitian to learn more about healthy cooking methods. °· Limit fluids if directed by your caregiver. Fluid restriction may reduce symptoms of heart failure in some people. °· Weigh yourself every day. Daily weights are important in the early recognition of excess fluid. You should weigh yourself every morning after you urinate and before you eat breakfast. Wear the same amount of clothing each time you weigh yourself. Record your daily weight. Provide your caregiver with your weight record. °· Monitor and record your blood pressure if directed by your caregiver. °· Check your pulse if directed by your caregiver. °· Lose weight if directed   by your caregiver. Weight loss may reduce symptoms of heart failure in some people. °· Stop smoking or chewing tobacco. Nicotine makes your heart work harder by causing your blood vessels to constrict. Do not use nicotine gum or patches before talking to your caregiver. °· Schedule and attend follow-up visits as  directed by your caregiver. It is important to keep all your appointments. °· Limit alcohol intake to no more than 1 drink per day for nonpregnant women and 2 drinks per day for men. Drinking more than that is harmful to your heart. Tell your caregiver if you drink alcohol several times a week. Talk with your caregiver about whether alcohol is safe for you. If your heart has already been damaged by alcohol or you have severe heart failure, drinking alcohol should be stopped completely. °· Stop illicit drug use. °· Stay up-to-date with immunizations. It is especially important to prevent respiratory infections through current pneumococcal and influenza immunizations. °· Manage other health conditions such as hypertension, diabetes, thyroid disease, or abnormal heart rhythms as directed by your caregiver. °· Learn to manage stress. °· Plan rest periods when fatigued. °· Learn strategies to manage high temperatures. If the weather is extremely hot: °· Avoid vigorous physical activity. °· Use air conditioning or fans or seek a cooler location. °· Avoid caffeine and alcohol. °· Wear loose-fitting, lightweight, and light-colored clothing. °· Learn strategies to manage cold temperatures. If the weather is extremely cold: °· Avoid vigorous physical activity. °· Layer clothes. °· Wear mittens or gloves, a hat, and a scarf when going outside. °· Avoid alcohol. °· Obtain ongoing education and support as needed. °· Participate or seek rehabilitation as needed to maintain or improve independence and quality of life. °SEEK MEDICAL CARE IF:  °· Your weight increases by 03 lb/1.4 kg in 1 day or 05 lb/2.3 kg in a week. °· You have increasing shortness of breath that is unusual for you. °· You are unable to participate in your usual physical activities. °· You tire easily. °· You cough more than normal, especially with physical activity. °· You have any or more swelling in areas such as your hands, feet, ankles, or abdomen. °· You  are unable to sleep because it is hard to breathe. °· You feel like your heart is beating fast (palpitations). °· You become dizzy or lightheaded upon standing up. °SEEK IMMEDIATE MEDICAL CARE IF:  °· You have difficulty breathing. °· There is a change in mental status such as decreased alertness or difficulty with concentration. °· You have a pain or discomfort in your chest. °· You have an episode of fainting (syncope). °MAKE SURE YOU:  °· Understand these instructions. °· Will watch your condition. °· Will get help right away if you are not doing well or get worse. °Document Released: 03/05/2005 Document Revised: 06/30/2012 Document Reviewed: 03/27/2012 °[employer]® Patient Information ©2014 [employer]. ° °

## 2013-08-20 NOTE — Discharge Summary (Signed)
Physician Discharge Summary  [name] DPO:[id number] DOB: 1951/03/19 DOA: 08/16/2013  PCP: Corey Harold, MD  Admit date: 08/16/2013 Discharge date: 08/20/2013  Recommendations for Outpatient Follow-up:  1. Pt will need to follow up with PCP in 1-2 weeks post discharge 2. Please obtain BMP to evaluate electrolytes and kidney function 3. Please also check CBC to evaluate Hg and Hct levels 4. Please note that pt made aware to follow up with her PCP or myself in community [facility] clinic in 1 - 2 weeks 5. Pt may need referral to nephrologist if kidney function continues to deteriorate  6. Please note that weight on discharge is 138 lbs and needs to be re checked upon follow up 7. Please note that pt was discharged on lasix and dose needs to be adjusted as clinically indicated   Discharge Diagnoses: Acute on chronic systolic and diastolic CHF  Principal Problem:   Acute combined systolic and diastolic heart failure Active Problems:   DM2 (diabetes mellitus, type 2)   HTN (hypertension)   Hyperlipidemia   LVH (left ventricular hypertrophy)   Hypertensive urgency   Chest discomfort   Chronic kidney disease (CKD), stage IV (severe)   Uncontrolled type 2 diabetes mellitus with stage 4 chronic kidney disease   DCM (dilated cardiomyopathy)   Systolic and diastolic CHF, acute  Discharge Condition: Stable  Diet recommendation: Heart healthy diet discussed in details   History of present illness:  63yo WF PMHx HTN, DM 2, CKD, HLD, anemia presented to the ED on 08/16/13 with one month history of dyspnea, orthopnea, bilateral leg edema, and chest discomfort. Patient was found to be in decompensated CHF and mildly elevated troponins likely secondary to decompensated CHF in the context of chronic kidney disease. Cardiology consulted. CHF improved. She was transferred from stepdown unit to telemetry on 6/1. Creatinine has increased from 2.8-3.4 and Lasix temporarily put on hold.    Assessment/Plan:  Acute combined systolic and diastolic CHF:  - Continue beta blocker  - Diuretic on hold initially due to worsening kidney function  - no ACE I secondary to CKD  - please note that weight increased by 2 lbs overnight and since Cr is trending down, will place on low dose Lasix upon discharge - pt made aware of need for follow up with PCP or myself as noted above  - she was also made aware she needs to monitor her weight daily and call PCP if weight increases > 3 lbs in 24 hours from her baseline - pt also made aware she needs to have kidney function re checked upon follow up  Chest discomfort/elevated troponin in the setting of acute CHF and CKD  - at this time she is not a candidate for cath given her worsening kidney function that would have a high liklihood of progression if she was subjected to contrast dye. Once acute illness improves would recommend outpatient nuclear stress test but would only consider cath if stress test very high risk.  - continue ASA  Hypertensive urgency:  - continue Amlodipine  - Coreg increased to 6.25mg  BID, Lasix added upon discharge   Acute on Stage IV chronic kidney disease:  - improvement in Cr over the past 24 hours - placed back on low dose of Lasix as noted above  Type II DM with renal complications/hypoglycemia:  - CBGs stable inpatient  - continue Glimepiride upon discharge  Anemia:  - Stable most likely secondary to chronic kidney disease.  Hyperlipidemia:  - LDL 120. Continue statins  Code Status: FULL  Family Communication: no family present at time of exam   Consultants:   Dr. Armanda Magic (cardiology)  Procedure/Significant Events:   6/1 echocardiogram Left ventricle: mild LVH. LVEF=45%-50%. (grade 1 diastolic dysfunction).  Procedures/Studies:  Dg Chest 2 View   08/16/2013 Findings compatible with mild CHF/ volume overload with bilateral pleural effusions and associated atelectasis.     Discharge Exam: Filed  Vitals:   08/20/13 0500  BP: 140/82  Pulse: 81  Temp: 98.1 F (36.7 C)  Resp: 20   Filed Vitals:   08/19/13 0611 08/19/13 1502 08/19/13 2129 08/20/13 0500  BP: 150/86 131/71 136/79 140/82  Pulse: 84 72 76 81  Temp: 98.2 F (36.8 C) 97.7 F (36.5 C) 98.2 F (36.8 C) 98.1 F (36.7 C)  TempSrc: Oral Oral Oral Oral  Resp: 20 21 20 20   Height:      Weight: 61.054 kg (134 lb 9.6 oz)   62.687 kg (138 lb 3.2 oz)  SpO2: 100% 100% 93% 95%    General: Pt is alert, follows commands appropriately, not in acute distress Cardiovascular: Regular rate and rhythm, no rubs, no gallops Respiratory: Clear to auscultation bilaterally, no wheezing, no crackles, no rhonchi Abdominal: Soft, non tender, non distended, bowel sounds +, no guarding Extremities: no edema, no cyanosis, pulses palpable bilaterally DP and PT Neuro: Grossly nonfocal  Discharge Instructions  Discharge Instructions   Diet - low sodium heart healthy    Complete by:  As directed      Increase activity slowly    Complete by:  As directed             Medication List         amLODipine 5 MG tablet  Commonly known as:  NORVASC  Take 1 tablet (5 mg total) by mouth every morning.     aspirin EC 81 MG tablet  Take 81 mg by mouth every morning.     carvedilol 6.25 MG tablet  Commonly known as:  COREG  Take 1 tablet (6.25 mg total) by mouth 2 (two) times daily with a meal.     COMBIGAN 0.2-0.5 % ophthalmic solution  Generic drug:  brimonidine-timolol  Place 1 drop into both eyes 3 (three) times daily.     furosemide 20 MG tablet  Commonly known as:  LASIX  Take 1 tablet (20 mg total) by mouth daily.     glimepiride 4 MG tablet  Commonly known as:  AMARYL  Take 1 tablet (4 mg total) by mouth daily before breakfast.     glucose blood test strip  Use as instructed     onetouch ultrasoft lancets  Use as instructed     pravastatin 40 MG tablet  Commonly known as:  PRAVACHOL  Take 1 tablet (40 mg total) by  mouth daily.           Follow-up Information   Schedule an appointment as soon as possible for a visit with [facility], MD.   Specialty:  Allergy and Immunology   Contact information:   [address] [place] [address] [phone]       Follow up with Debbora Presto, MD. (As needed, If symptoms worsen, call my cell phone [phone])    Specialty:  Internal Medicine   Contact information:   [address] [place] [address] [phone]        The results of significant diagnostics from this hospitalization (including imaging, microbiology, ancillary and laboratory) are listed below for reference.  Microbiology: Recent Results (from the past 240 hour(s))  MRSA PCR SCREENING     Status: None   Collection Time    08/16/13  8:20 PM      Result Value Ref Range Status   MRSA by PCR NEGATIVE  NEGATIVE Final   Comment:            The GeneXpert MRSA Assay (FDA     approved for NASAL specimens     only), is one component of a     comprehensive MRSA colonization     surveillance program. It is not     intended to diagnose MRSA     infection nor to guide or     monitor treatment for     MRSA infections.     Labs: Basic Metabolic Panel:  Recent Labs Lab 08/16/13 1758 08/17/13 0139 08/18/13 0350 08/19/13 0320 08/20/13 1020  NA 139 139 140 138 138  K 4.2 3.9 3.9 3.9 4.3  CL 106 107 105 103 103  CO2 21 22 24 24 20   GLUCOSE 69* 69* 132* 137* 159*  BUN 29* 29* 34* 41* 44*  CREATININE 2.81* 2.86* 3.42* 3.50* 3.28*  CALCIUM 9.1 8.7 8.6 8.4 8.8  MG  --  2.2  --   --   --    Liver Function Tests:  Recent Labs Lab 08/16/13 1758  AST 22  ALT 20  ALKPHOS 116  BILITOT <0.2*  PROT 6.3  ALBUMIN 2.6*   CBC:  Recent Labs Lab 08/16/13 1758 08/18/13 0350 08/19/13 0320  WBC 5.5 4.3 5.3  HGB 11.5* 9.5* 9.7*  HCT 33.3* 29.0* 29.2*  MCV 83.5 83.8 83.4  PLT 268 249 240   Cardiac Enzymes:  Recent Labs Lab 08/16/13 1758  08/16/13 1935 08/17/13 0136 08/17/13 0731  TROPONINI 0.64* 0.63* 0.64* 0.65*   BNP: BNP (last 3 results)  Recent Labs  08/16/13 1758 08/20/13 1020  PROBNP 15783.0* 8385.0*   CBG:  Recent Labs Lab 08/19/13 1200 08/19/13 1701 08/19/13 2129 08/20/13 0735 08/20/13 1121  GLUCAP 160* 113* 127* 105* 142*     SIGNED: Time coordinating discharge: Over 30 minutes  Dorothea OgleIskra M Mallorie Norrod, MD  [facility] 08/20/2013, 12:57 PM Pager [phone]  If 7PM-7AM, please contact night-coverage [email or web address] Password [email or web address]

## 2013-08-20 NOTE — Progress Notes (Signed)
SUBJECTIVE:  No complaints  OBJECTIVE:   Vitals:   Filed Vitals:   08/19/13 0611 08/19/13 1502 08/19/13 2129 08/20/13 0500  BP: 150/86 131/71 136/79 140/82  Pulse: 84 72 76 81  Temp: 98.2 F (36.8 C) 97.7 F (36.5 C) 98.2 F (36.8 C) 98.1 F (36.7 C)  TempSrc: Oral Oral Oral Oral  Resp: 20 21 20 20   Height:      Weight: 134 lb 9.6 oz (61.054 kg)   138 lb 3.2 oz (62.687 kg)  SpO2: 100% 100% 93% 95%   I&O's:   Intake/Output Summary (Last 24 hours) at 08/20/13 0749 Last data filed at 08/19/13 2131  Gross per 24 hour  Intake    360 ml  Output    651 ml  Net   -291 ml   TELEMETRY: Reviewed telemetry pt in NSR:     PHYSICAL EXAM General: Well developed, well nourished, in no acute distress Head: Eyes PERRLA, No xanthomas.   Normal cephalic and atramatic  Lungs:   Clear bilaterally to auscultation and percussion. Heart:   HRRR S1 S2 Pulses are 2+ & equal. Abdomen: Bowel sounds are positive, abdomen soft and non-tender without masses Extremities:   No clubbing, cyanosis or edema.  DP +1 Neuro: Alert and oriented X 3. Psych:  Good affect, responds appropriately   LABS: Basic Metabolic Panel:  Recent Labs  35/36/14 0350 08/19/13 0320  NA 140 138  K 3.9 3.9  CL 105 103  CO2 24 24  GLUCOSE 132* 137*  BUN 34* 41*  CREATININE 3.42* 3.50*  CALCIUM 8.6 8.4   Liver Function Tests: No results found for this basename: AST, ALT, ALKPHOS, BILITOT, PROT, ALBUMIN,  in the last 72 hours No results found for this basename: LIPASE, AMYLASE,  in the last 72 hours CBC:  Recent Labs  08/18/13 0350 08/19/13 0320  WBC 4.3 5.3  HGB 9.5* 9.7*  HCT 29.0* 29.2*  MCV 83.8 83.4  PLT 249 240   Cardiac Enzymes: No results found for this basename: CKTOTAL, CKMB, CKMBINDEX, TROPONINI,  in the last 72 hours BNP: No components found with this basename: POCBNP,  D-Dimer: No results found for this basename: DDIMER,  in the last 72 hours Hemoglobin A1C: No results found for this  basename: HGBA1C,  in the last 72 hours Fasting Lipid Panel: No results found for this basename: CHOL, HDL, LDLCALC, TRIG, CHOLHDL, LDLDIRECT,  in the last 72 hours Thyroid Function Tests: No results found for this basename: TSH, T4TOTAL, FREET3, T3FREE, THYROIDAB,  in the last 72 hours Anemia Panel:  Recent Labs  08/18/13 1211  VITAMINB12 467  FOLATE 13.5  FERRITIN 85  TIBC 220*  IRON 31*  RETICCTPCT 1.5   Coag Panel:   No results found for this basename: INR, PROTIME    RADIOLOGY: Dg Chest 2 View  08/16/2013   CLINICAL DATA:  Short of breath.  Leg swelling.  EXAM: CHEST  2 VIEW  COMPARISON:  04/18/2011.  FINDINGS: Lung volumes are low. There is basilar atelectasis and bilateral pleural effusions. The effusions appear small. Pulmonary vascular congestion is present. Cholecystectomy clips are present in the right upper quadrant.  IMPRESSION: Findings compatible with mild CHF/ volume overload with bilateral pleural effusions and associated atelectasis.   Electronically Signed   By: Andreas Newport M.D.   On: 08/16/2013 18:25    Problem List  Acute heart failure  Mildly elevated Troponin in setting of heart failure and chronic kidney disease  Hypertensive Urgency  Hypertension -  longstanding with LVH on ECG  Acute on chronic renal insufficiency/kidney disease  T2DM  Anemia  Dyslipidemia  RBBB on ECG  Assessment/Plan:  Ms. Jasmine DecemberOglesby is a 63 yo woman with PMH of T2DM, HTN, dyslipidemia, CKD who presents with 1 month of progressive SOB, lower extremity edema and more recently some intermittent chest pain.  1. Acute combined systolic/diastolic CHF - 2D echo showed mild LV dysfunction EF 45-50%. No RWMA's. CHF appears resolved. - Continue beta blocker  - Diuretic on hold due to worsening kidney function  - no ACE I secondary to CKD  2. Elevated troponin in the setting of CHF and CKD stage 4. The troponins have been fairly flat with no trend upward so most likely secondary to CHF with  CKD.  - at this time she is not a candidate for cath given her worsening kidney function that would have a high liklihood of progression if she was subjected to contrast dye. Once acute illness improves would recommend outpatient nuclear stress test but would only consider cath if stress test very high risk.  - continue ASA  3. Hypertensive urgency - BP improved  - continue AmlodipineCoreg  4. Acute on chronic Renal insuff - creatinine worse today  - Lasix on hold - await results of BMET today - consider renal consult    Quintella Reichertraci R Zuleika Gallus, MD  08/20/2013  7:49 AM

## 2013-08-20 NOTE — Progress Notes (Signed)
Came to bedside to speak with her about PCP follow up. She states she plans on following up with [facility]. Asked that she call [employer] to change her PCP after discharge as her PCP still shows Dr Oneta Rack. Noted she was recently seen by Dr Elesa Massed who is not a [facility] provider. Since patient is now eligible for [facility] Care Management services, discussed the program with her. She states she wants to think about it and will call if she should be interested. Left brochure with contact information and also provided [employer] number. Appreciative of visit. Hyman Hopes- RN,BSN- [facility] [phone]

## 2013-08-20 NOTE — Progress Notes (Signed)
Discharge to home, D/c instructions and follow up appointments done and discussed with patient. PIV removed no s/s of infiltration or swelling noted.

## 2013-08-25 ENCOUNTER — Telehealth: Payer: Self-pay | Admitting: Allergy and Immunology

## 2013-08-25 NOTE — Telephone Encounter (Signed)
Pt has come in today looking for physician she saw in the hospital earlier this week; pt states that she was told to wait for physician to come back with a letter saying she could return to work but never received the letter; please f/u with pt asap as her job has told her she may be terminated without this documentation; pt phone number is [phone], you can call anytime

## 2013-09-02 ENCOUNTER — Encounter: Payer: Self-pay | Admitting: Internal Medicine

## 2013-09-02 ENCOUNTER — Ambulatory Visit: Payer: Medicare HMO | Attending: Internal Medicine | Admitting: Internal Medicine

## 2013-09-02 VITALS — BP 166/99 | HR 82 | Temp 98.7°F | Resp 16 | Ht 61.0 in | Wt 130.0 lb

## 2013-09-02 DIAGNOSIS — E785 Hyperlipidemia, unspecified: Secondary | ICD-10-CM | POA: Insufficient documentation

## 2013-09-02 DIAGNOSIS — I5041 Acute combined systolic (congestive) and diastolic (congestive) heart failure: Secondary | ICD-10-CM | POA: Insufficient documentation

## 2013-09-02 DIAGNOSIS — I129 Hypertensive chronic kidney disease with stage 1 through stage 4 chronic kidney disease, or unspecified chronic kidney disease: Secondary | ICD-10-CM | POA: Insufficient documentation

## 2013-09-02 DIAGNOSIS — E1165 Type 2 diabetes mellitus with hyperglycemia: Secondary | ICD-10-CM

## 2013-09-02 DIAGNOSIS — N184 Chronic kidney disease, stage 4 (severe): Secondary | ICD-10-CM | POA: Insufficient documentation

## 2013-09-02 DIAGNOSIS — IMO0001 Reserved for inherently not codable concepts without codable children: Secondary | ICD-10-CM | POA: Insufficient documentation

## 2013-09-02 DIAGNOSIS — D649 Anemia, unspecified: Secondary | ICD-10-CM | POA: Insufficient documentation

## 2013-09-02 DIAGNOSIS — Z87891 Personal history of nicotine dependence: Secondary | ICD-10-CM | POA: Insufficient documentation

## 2013-09-02 DIAGNOSIS — Z760 Encounter for issue of repeat prescription: Secondary | ICD-10-CM | POA: Insufficient documentation

## 2013-09-02 DIAGNOSIS — I517 Cardiomegaly: Secondary | ICD-10-CM | POA: Insufficient documentation

## 2013-09-02 LAB — CBC WITH DIFFERENTIAL/PLATELET
Basophils Absolute: 0 10*3/uL (ref 0.0–0.1)
Basophils Relative: 1 % (ref 0–1)
Eosinophils Absolute: 0.2 10*3/uL (ref 0.0–0.7)
Eosinophils Relative: 5 % (ref 0–5)
HCT: 28.3 % — ABNORMAL LOW (ref 36.0–46.0)
Hemoglobin: 9.8 g/dL — ABNORMAL LOW (ref 12.0–15.0)
LYMPHS PCT: 44 % (ref 12–46)
Lymphs Abs: 1.6 10*3/uL (ref 0.7–4.0)
MCH: 28 pg (ref 26.0–34.0)
MCHC: 34.6 g/dL (ref 30.0–36.0)
MCV: 80.9 fL (ref 78.0–100.0)
Monocytes Absolute: 0.6 10*3/uL (ref 0.1–1.0)
Monocytes Relative: 16 % — ABNORMAL HIGH (ref 3–12)
NEUTROS ABS: 1.3 10*3/uL — AB (ref 1.7–7.7)
NEUTROS PCT: 34 % — AB (ref 43–77)
Platelets: 267 10*3/uL (ref 150–400)
RBC: 3.5 MIL/uL — ABNORMAL LOW (ref 3.87–5.11)
RDW: 15.1 % (ref 11.5–15.5)
WBC: 3.7 10*3/uL — ABNORMAL LOW (ref 4.0–10.5)

## 2013-09-02 MED ORDER — CARVEDILOL 6.25 MG PO TABS: 6.2500 mg | ORAL_TABLET | Freq: Two times a day (BID) | ORAL | Status: DC

## 2013-09-02 MED ORDER — BRIMONIDINE TARTRATE-TIMOLOL 0.2-0.5 % OP SOLN: 1.0000 [drp] | Freq: Three times a day (TID) | OPHTHALMIC | Status: AC

## 2013-09-02 MED ORDER — FUROSEMIDE 20 MG PO TABS: 20.0000 mg | ORAL_TABLET | Freq: Every day | ORAL | Status: DC

## 2013-09-02 MED ORDER — GLIMEPIRIDE 4 MG PO TABS: 4.0000 mg | ORAL_TABLET | Freq: Every day | ORAL | Status: DC

## 2013-09-02 MED ORDER — PRAVASTATIN SODIUM 40 MG PO TABS: 40.0000 mg | ORAL_TABLET | Freq: Every day | ORAL | Status: DC

## 2013-09-02 NOTE — Progress Notes (Signed)
Patient ID: [name], female   DOB: 08-24-1950, 63 y.o.   MRN: [id number]   CC: Hospital followup  HPI: Patient is 63 year old female with history of hypertension, diabetes mellitus, hyperlipidemia, presents to the clinic for regular followup requesting refill of medications. She reports feeling well, denies chest pain and shortness of breath, no specific abdominal or urinary concerns, no specific focal neurological symptoms. She denies sick contacts or exposures. Reports compliance with medications.  Allergies  Allergen Reactions  . Nitrofurantoin Monohyd Macro Nausea Only   Past Medical History  Diagnosis Date  . Hypertension   . Diabetes mellitus   . Anemia   . Hyperlipidemia   . Left ventricular hypertrophy    Current Outpatient Prescriptions on File Prior to Visit  Medication Sig Dispense Refill  . aspirin EC 81 MG tablet Take 81 mg by mouth every morning.      Marland Kitchen amLODipine (NORVASC) 5 MG tablet Take 1 tablet (5 mg total) by mouth every morning.  30 tablet  2  . glucose blood test strip Use as instructed  32 each  12  . Lancets (ONETOUCH ULTRASOFT) lancets Use as instructed  100 each  4   No current facility-administered medications on file prior to visit.   Family History  Problem Relation Age of Onset  . Diabetes type II    . Hypertension     History   Social History  . Marital Status: Married    Spouse Name: N/A    Number of Children: N/A  . Years of Education: N/A   Occupational History  . Not on file.   Social History Main Topics  . Smoking status: Former Smoker    Types: Cigarettes    Quit date: 03/19/1966  . Smokeless tobacco: Never Used  . Alcohol Use: No  . Drug Use: No  . Sexual Activity: No   Other Topics Concern  . Not on file   Social History Narrative  . No narrative on file    Review of Systems  Constitutional: Negative for fever, chills, diaphoresis, activity change, appetite change and fatigue.  HENT: Negative for ear pain,  nosebleeds, congestion, facial swelling, rhinorrhea, neck pain, neck stiffness and ear discharge.   Eyes: Negative for pain, discharge, redness, itching and visual disturbance.  Respiratory: Negative for cough, choking, chest tightness, shortness of breath, wheezing and stridor.   Cardiovascular: Negative for chest pain, palpitations and leg swelling.  Gastrointestinal: Negative for abdominal distention.  Genitourinary: Negative for dysuria, urgency, frequency, hematuria, flank pain, decreased urine volume, difficulty urinating and dyspareunia.  Musculoskeletal: Negative for back pain, joint swelling, arthralgias and gait problem.  Neurological: Negative for dizziness, tremors, seizures, syncope, facial asymmetry, speech difficulty, weakness, light-headedness, numbness and headaches.  Hematological: Negative for adenopathy. Does not bruise/bleed easily.  Psychiatric/Behavioral: Negative for hallucinations, behavioral problems, confusion, dysphoric mood, decreased concentration and agitation.    Objective:   Filed Vitals:   09/02/13 1442  BP: 166/99  Pulse: 82  Temp: 98.7 F (37.1 C)  Resp: 16    Physical Exam  Constitutional: Appears well-developed and well-nourished. No distress.  CVS: RRR, S1/S2 +, no gallops, no carotid bruit.  Pulmonary: Effort and breath sounds normal, no stridor, rhonchi, wheezes, rales.  Abdominal: Soft. BS +,  no distension, tenderness, rebound or guarding.  Musculoskeletal: Normal range of motion. No edema and no tenderness.    Lab Results  Component Value Date   WBC 5.3 08/19/2013   HGB 9.7* 08/19/2013   HCT 29.2* 08/19/2013  MCV 83.4 08/19/2013   PLT 240 08/19/2013   Lab Results  Component Value Date   CREATININE 3.28* 08/20/2013   BUN 44* 08/20/2013   NA 138 08/20/2013   K 4.3 08/20/2013   CL 103 08/20/2013   CO2 20 08/20/2013    Lab Results  Component Value Date   HGBA1C 7.6* 08/17/2013   Lipid Panel     Component Value Date/Time   CHOL 215* 08/17/2013  0136   TRIG 98 08/17/2013 0136   HDL 75 08/17/2013 0136   CHOLHDL 2.9 08/17/2013 0136   VLDL 20 08/17/2013 0136   LDLCALC 120* 08/17/2013 0136       Assessment and plan:   Patient Active Problem List   Diagnosis Date Noted  . Systolic and diastolic CHF, acute - Clinically compensated on today's exam, patient maintaining oxygen saturation at target range - No signs of volume overload on exam  08/19/2013  . Hypertensive urgency - Resolved but blood pressure still elevated  - Discussed importance of medical compliance, patient advised to check blood pressure regularly and to call if back if the numbers are higher than 140/90  08/17/2013  . Chronic kidney disease (CKD), stage IV (severe) - Likely secondary to chronic hypertension and use of Lasix  - Recommend blood work today, BMP  08/17/2013  . Uncontrolled type 2 diabetes mellitus with stage 4 chronic kidney disease -  Continue current medical regimen 08/17/2013

## 2013-09-02 NOTE — Progress Notes (Signed)
Pt here per HFU for CHF d/c'dfrom [facility] 08/16/13 on Lasix  No swelling or sob noted. vss Pt need refills on Norvasc, ran out weeks ago BP elevated 166/99 82 Pt requesting blood sugar machine Last A1c- 7/6 6/11

## 2013-09-03 LAB — COMPREHENSIVE METABOLIC PANEL
ALT: 10 U/L (ref 0–35)
AST: 14 U/L (ref 0–37)
Albumin: 2.9 g/dL — ABNORMAL LOW (ref 3.5–5.2)
Alkaline Phosphatase: 82 U/L (ref 39–117)
BUN: 32 mg/dL — ABNORMAL HIGH (ref 6–23)
CALCIUM: 8.6 mg/dL (ref 8.4–10.5)
CHLORIDE: 107 meq/L (ref 96–112)
CO2: 23 mEq/L (ref 19–32)
Creat: 3.25 mg/dL — ABNORMAL HIGH (ref 0.50–1.10)
Glucose, Bld: 158 mg/dL — ABNORMAL HIGH (ref 70–99)
POTASSIUM: 4.2 meq/L (ref 3.5–5.3)
Sodium: 138 mEq/L (ref 135–145)
Total Bilirubin: 0.3 mg/dL (ref 0.2–1.2)
Total Protein: 5.5 g/dL — ABNORMAL LOW (ref 6.0–8.3)

## 2013-09-03 LAB — BRAIN NATRIURETIC PEPTIDE: Brain Natriuretic Peptide: 915.5 pg/mL — ABNORMAL HIGH (ref 0.0–100.0)

## 2013-09-16 ENCOUNTER — Telehealth: Payer: Self-pay | Admitting: Internal Medicine

## 2013-09-16 NOTE — Telephone Encounter (Signed)
Pt has come in today requesting FMLA paperwork that she had dropped off for the PCP to sign; please contact pt about paperwork status

## 2013-10-13 ENCOUNTER — Other Ambulatory Visit: Payer: Self-pay

## 2013-10-14 ENCOUNTER — Other Ambulatory Visit: Payer: Self-pay

## 2013-10-21 ENCOUNTER — Inpatient Hospital Stay (HOSPITAL_COMMUNITY): Payer: Medicare HMO

## 2013-10-21 ENCOUNTER — Inpatient Hospital Stay (HOSPITAL_COMMUNITY)
Admission: EM | Admit: 2013-10-21 | Discharge: 2013-10-25 | DRG: 698 | Disposition: A | Discharge status: Home / Self Care | Payer: Medicare HMO | Attending: Internal Medicine | Admitting: Internal Medicine

## 2013-10-21 ENCOUNTER — Emergency Department (HOSPITAL_COMMUNITY): Payer: Medicare HMO

## 2013-10-21 ENCOUNTER — Institutional Professional Consult (permissible substitution): Payer: Commercial Managed Care - HMO | Admitting: Internal Medicine

## 2013-10-21 ENCOUNTER — Encounter (HOSPITAL_COMMUNITY): Payer: Self-pay | Admitting: Emergency Medicine

## 2013-10-21 DIAGNOSIS — E1165 Type 2 diabetes mellitus with hyperglycemia: Secondary | ICD-10-CM | POA: Diagnosis present

## 2013-10-21 DIAGNOSIS — I5043 Acute on chronic combined systolic (congestive) and diastolic (congestive) heart failure: Secondary | ICD-10-CM | POA: Diagnosis present

## 2013-10-21 DIAGNOSIS — I4510 Unspecified right bundle-branch block: Secondary | ICD-10-CM

## 2013-10-21 DIAGNOSIS — I129 Hypertensive chronic kidney disease with stage 1 through stage 4 chronic kidney disease, or unspecified chronic kidney disease: Secondary | ICD-10-CM

## 2013-10-21 DIAGNOSIS — I509 Heart failure, unspecified: Secondary | ICD-10-CM | POA: Diagnosis present

## 2013-10-21 DIAGNOSIS — I517 Cardiomegaly: Secondary | ICD-10-CM

## 2013-10-21 DIAGNOSIS — Z87891 Personal history of nicotine dependence: Secondary | ICD-10-CM | POA: Diagnosis not present

## 2013-10-21 DIAGNOSIS — Z888 Allergy status to other drugs, medicaments and biological substances status: Secondary | ICD-10-CM

## 2013-10-21 DIAGNOSIS — I420 Dilated cardiomyopathy: Secondary | ICD-10-CM

## 2013-10-21 DIAGNOSIS — N058 Unspecified nephritic syndrome with other morphologic changes: Secondary | ICD-10-CM | POA: Diagnosis present

## 2013-10-21 DIAGNOSIS — I5041 Acute combined systolic (congestive) and diastolic (congestive) heart failure: Secondary | ICD-10-CM | POA: Diagnosis not present

## 2013-10-21 DIAGNOSIS — E785 Hyperlipidemia, unspecified: Secondary | ICD-10-CM | POA: Diagnosis present

## 2013-10-21 DIAGNOSIS — IMO0001 Reserved for inherently not codable concepts without codable children: Secondary | ICD-10-CM

## 2013-10-21 DIAGNOSIS — I150 Renovascular hypertension: Secondary | ICD-10-CM

## 2013-10-21 DIAGNOSIS — E1139 Type 2 diabetes mellitus with other diabetic ophthalmic complication: Secondary | ICD-10-CM | POA: Diagnosis present

## 2013-10-21 DIAGNOSIS — N179 Acute kidney failure, unspecified: Secondary | ICD-10-CM | POA: Diagnosis present

## 2013-10-21 DIAGNOSIS — D508 Other iron deficiency anemias: Secondary | ICD-10-CM

## 2013-10-21 DIAGNOSIS — I428 Other cardiomyopathies: Secondary | ICD-10-CM | POA: Diagnosis present

## 2013-10-21 DIAGNOSIS — N289 Disorder of kidney and ureter, unspecified: Secondary | ICD-10-CM

## 2013-10-21 DIAGNOSIS — R809 Proteinuria, unspecified: Secondary | ICD-10-CM | POA: Diagnosis present

## 2013-10-21 DIAGNOSIS — I10 Essential (primary) hypertension: Secondary | ICD-10-CM | POA: Diagnosis present

## 2013-10-21 DIAGNOSIS — E1122 Type 2 diabetes mellitus with diabetic chronic kidney disease: Secondary | ICD-10-CM

## 2013-10-21 DIAGNOSIS — N189 Chronic kidney disease, unspecified: Secondary | ICD-10-CM

## 2013-10-21 DIAGNOSIS — N184 Chronic kidney disease, stage 4 (severe): Secondary | ICD-10-CM

## 2013-10-21 DIAGNOSIS — D509 Iron deficiency anemia, unspecified: Secondary | ICD-10-CM | POA: Diagnosis present

## 2013-10-21 DIAGNOSIS — IMO0002 Reserved for concepts with insufficient information to code with codable children: Secondary | ICD-10-CM

## 2013-10-21 DIAGNOSIS — Z7982 Long term (current) use of aspirin: Secondary | ICD-10-CM

## 2013-10-21 DIAGNOSIS — J90 Pleural effusion, not elsewhere classified: Secondary | ICD-10-CM | POA: Diagnosis present

## 2013-10-21 DIAGNOSIS — E1129 Type 2 diabetes mellitus with other diabetic kidney complication: Principal | ICD-10-CM | POA: Diagnosis present

## 2013-10-21 DIAGNOSIS — R0602 Shortness of breath: Secondary | ICD-10-CM | POA: Diagnosis present

## 2013-10-21 DIAGNOSIS — I160 Hypertensive urgency: Secondary | ICD-10-CM

## 2013-10-21 DIAGNOSIS — E119 Type 2 diabetes mellitus without complications: Secondary | ICD-10-CM | POA: Diagnosis present

## 2013-10-21 LAB — FERRITIN: FERRITIN: 78 ng/mL (ref 10–291)

## 2013-10-21 LAB — GLUCOSE, CAPILLARY
GLUCOSE-CAPILLARY: 168 mg/dL — AB (ref 70–99)
Glucose-Capillary: 209 mg/dL — ABNORMAL HIGH (ref 70–99)

## 2013-10-21 LAB — CBC
HCT: 34.1 % — ABNORMAL LOW (ref 36.0–46.0)
HEMATOCRIT: 31.3 % — AB (ref 36.0–46.0)
Hemoglobin: 10.3 g/dL — ABNORMAL LOW (ref 12.0–15.0)
Hemoglobin: 11.1 g/dL — ABNORMAL LOW (ref 12.0–15.0)
MCH: 28.1 pg (ref 26.0–34.0)
MCH: 27.8 pg (ref 26.0–34.0)
MCHC: 32.9 g/dL (ref 30.0–36.0)
MCHC: 32.6 g/dL (ref 30.0–36.0)
MCV: 85.5 fL (ref 78.0–100.0)
MCV: 85.3 fL (ref 78.0–100.0)
PLATELETS: 220 10*3/uL (ref 150–400)
Platelets: 205 10*3/uL (ref 150–400)
RBC: 3.66 MIL/uL — AB (ref 3.87–5.11)
RBC: 4 MIL/uL (ref 3.87–5.11)
RDW: 14.2 % (ref 11.5–15.5)
RDW: 14.2 % (ref 11.5–15.5)
WBC: 4.2 10*3/uL (ref 4.0–10.5)
WBC: 4.6 10*3/uL (ref 4.0–10.5)

## 2013-10-21 LAB — COMPREHENSIVE METABOLIC PANEL
ALT: 82 U/L — ABNORMAL HIGH (ref 0–35)
ANION GAP: 12 (ref 5–15)
AST: 55 U/L — ABNORMAL HIGH (ref 0–37)
Albumin: 3 g/dL — ABNORMAL LOW (ref 3.5–5.2)
Alkaline Phosphatase: 138 U/L — ABNORMAL HIGH (ref 39–117)
BUN: 37 mg/dL — AB (ref 6–23)
CO2: 22 meq/L (ref 19–32)
CREATININE: 2.96 mg/dL — AB (ref 0.50–1.10)
Calcium: 8.7 mg/dL (ref 8.4–10.5)
Chloride: 107 mEq/L (ref 96–112)
GFR calc Af Amer: 18 mL/min — ABNORMAL LOW (ref >90)
GFR, EST NON AFRICAN AMERICAN: 16 mL/min — AB (ref >90)
Glucose, Bld: 222 mg/dL — ABNORMAL HIGH (ref 70–99)
Potassium: 4.6 mEq/L (ref 3.7–5.3)
Sodium: 141 mEq/L (ref 137–147)
Total Bilirubin: 0.2 mg/dL — ABNORMAL LOW (ref 0.3–1.2)
Total Protein: 6.1 g/dL (ref 6.0–8.3)

## 2013-10-21 LAB — IRON AND TIBC
IRON: 39 ug/dL — AB (ref 42–135)
Saturation Ratios: 14 % — ABNORMAL LOW (ref 20–55)
TIBC: 276 ug/dL (ref 250–470)
UIBC: 237 ug/dL (ref 125–400)

## 2013-10-21 LAB — CREATININE, SERUM
CREATININE: 2.72 mg/dL — AB (ref 0.50–1.10)
GFR, EST AFRICAN AMERICAN: 20 mL/min — AB (ref >90)
GFR, EST NON AFRICAN AMERICAN: 17 mL/min — AB (ref >90)

## 2013-10-21 LAB — TROPONIN I
Troponin I: <0.3 ng/mL (ref <0.30)
Troponin I: <0.3 ng/mL (ref <0.30)
Troponin I: <0.3 ng/mL (ref <0.30)

## 2013-10-21 LAB — PRO B NATRIURETIC PEPTIDE: PRO B NATRI PEPTIDE: 32323 pg/mL — AB (ref 0–125)

## 2013-10-21 MED ORDER — SODIUM CHLORIDE 0.9 % IJ SOLN
3.0000 mL | Freq: Two times a day (BID) | INTRAMUSCULAR | Status: DC
  Administered 2013-10-21 – 2013-10-25 (×9): 3 mL via INTRAVENOUS

## 2013-10-21 MED ORDER — AMLODIPINE BESYLATE 10 MG PO TABS
10.0000 mg | ORAL_TABLET | Freq: Every morning | ORAL | Status: DC
  Administered 2013-10-22 – 2013-10-25 (×4): 10 mg via ORAL
  Filled 2013-10-21 (×4): qty 1

## 2013-10-21 MED ORDER — ONDANSETRON HCL 4 MG/2ML IJ SOLN: 4.0000 mg | Freq: Four times a day (QID) | INTRAMUSCULAR | Status: DC | PRN

## 2013-10-21 MED ORDER — TECHNETIUM TO 99M ALBUMIN AGGREGATED
6.0000 | Freq: Once | INTRAVENOUS | Status: AC | PRN
  Administered 2013-10-21: 6 via INTRAVENOUS

## 2013-10-21 MED ORDER — HYDRALAZINE HCL 20 MG/ML IJ SOLN
10.0000 mg | Freq: Once | INTRAMUSCULAR | Status: AC
  Administered 2013-10-21: 10 mg via INTRAVENOUS
  Filled 2013-10-21: qty 1

## 2013-10-21 MED ORDER — CARVEDILOL 12.5 MG PO TABS
12.5000 mg | ORAL_TABLET | Freq: Two times a day (BID) | ORAL | Status: DC
  Administered 2013-10-21 – 2013-10-25 (×8): 12.5 mg via ORAL
  Filled 2013-10-21 (×10): qty 1

## 2013-10-21 MED ORDER — ASPIRIN EC 81 MG PO TBEC
81.0000 mg | DELAYED_RELEASE_TABLET | Freq: Every morning | ORAL | Status: DC
  Administered 2013-10-22 – 2013-10-25 (×4): 81 mg via ORAL
  Filled 2013-10-21 (×4): qty 1

## 2013-10-21 MED ORDER — TECHNETIUM TC 99M DIETHYLENETRIAME-PENTAACETIC ACID: 40.0000 | Freq: Once | INTRAVENOUS | Status: AC | PRN

## 2013-10-21 MED ORDER — FUROSEMIDE 10 MG/ML IJ SOLN
20.0000 mg | Freq: Once | INTRAMUSCULAR | Status: AC
  Administered 2013-10-21: 20 mg via INTRAVENOUS
  Filled 2013-10-21: qty 2

## 2013-10-21 MED ORDER — ACETAMINOPHEN 650 MG RE SUPP: 650.0000 mg | Freq: Four times a day (QID) | RECTAL | Status: DC | PRN

## 2013-10-21 MED ORDER — HEPARIN SODIUM (PORCINE) 5000 UNIT/ML IJ SOLN
5000.0000 [IU] | Freq: Three times a day (TID) | INTRAMUSCULAR | Status: DC
  Administered 2013-10-21 – 2013-10-25 (×11): 5000 [IU] via SUBCUTANEOUS
  Filled 2013-10-21 (×12): qty 1

## 2013-10-21 MED ORDER — SIMVASTATIN 10 MG PO TABS
10.0000 mg | ORAL_TABLET | Freq: Every day | ORAL | Status: DC
  Administered 2013-10-21 – 2013-10-24 (×4): 10 mg via ORAL
  Filled 2013-10-21 (×5): qty 1

## 2013-10-21 MED ORDER — ACETAMINOPHEN 325 MG PO TABS: 650.0000 mg | ORAL_TABLET | Freq: Four times a day (QID) | ORAL | Status: DC | PRN

## 2013-10-21 MED ORDER — ISOSORBIDE MONONITRATE 15 MG HALF TABLET
15.0000 mg | ORAL_TABLET | Freq: Every day | ORAL | Status: DC
  Administered 2013-10-21 – 2013-10-25 (×5): 15 mg via ORAL
  Filled 2013-10-21 (×5): qty 1

## 2013-10-21 MED ORDER — INSULIN ASPART 100 UNIT/ML ~~LOC~~ SOLN
0.0000 [IU] | Freq: Three times a day (TID) | SUBCUTANEOUS | Status: DC
  Administered 2013-10-21: 2 [IU] via SUBCUTANEOUS
  Administered 2013-10-22 (×2): 1 [IU] via SUBCUTANEOUS
  Administered 2013-10-22: 2 [IU] via SUBCUTANEOUS
  Administered 2013-10-23 – 2013-10-24 (×3): 1 [IU] via SUBCUTANEOUS
  Administered 2013-10-25: 2 [IU] via SUBCUTANEOUS

## 2013-10-21 MED ORDER — HYDRALAZINE HCL 10 MG PO TABS
10.0000 mg | ORAL_TABLET | Freq: Three times a day (TID) | ORAL | Status: DC
  Administered 2013-10-21 – 2013-10-24 (×9): 10 mg via ORAL
  Filled 2013-10-21 (×12): qty 1

## 2013-10-21 MED ORDER — TIMOLOL MALEATE 0.5 % OP SOLN
1.0000 [drp] | Freq: Three times a day (TID) | OPHTHALMIC | Status: DC
  Administered 2013-10-21 – 2013-10-25 (×13): 1 [drp] via OPHTHALMIC
  Filled 2013-10-21: qty 5

## 2013-10-21 MED ORDER — FUROSEMIDE 10 MG/ML IJ SOLN
40.0000 mg | Freq: Two times a day (BID) | INTRAMUSCULAR | Status: DC
  Administered 2013-10-21 – 2013-10-22 (×2): 40 mg via INTRAVENOUS
  Filled 2013-10-21 (×4): qty 4

## 2013-10-21 MED ORDER — BRIMONIDINE TARTRATE-TIMOLOL 0.2-0.5 % OP SOLN: 1.0000 [drp] | Freq: Three times a day (TID) | OPHTHALMIC | Status: DC

## 2013-10-21 MED ORDER — ONDANSETRON HCL 4 MG PO TABS: 4.0000 mg | ORAL_TABLET | Freq: Four times a day (QID) | ORAL | Status: DC | PRN

## 2013-10-21 MED ORDER — BRIMONIDINE TARTRATE 0.2 % OP SOLN
1.0000 [drp] | Freq: Three times a day (TID) | OPHTHALMIC | Status: DC
  Administered 2013-10-21 – 2013-10-25 (×13): 1 [drp] via OPHTHALMIC
  Filled 2013-10-21: qty 5

## 2013-10-21 NOTE — ED Provider Notes (Signed)
I saw and evaluated the patient, reviewed the resident's note and I agree with the findings and plan.   EKG Interpretation   Date/Time:  Wednesday October 21 2013 08:47:37 EDT Ventricular Rate:  77 PR Interval:  120 QRS Duration: 128 QT Interval:  414 QTC Calculation: 468 R Axis:   -38 Text Interpretation:  Normal sinus rhythm Left axis deviation Right bundle  branch block Left ventricular hypertrophy with QRS widening T wave  abnormality, consider lateral ischemia Abnormal ECG No significant change  was found Confirmed by [facility]  MD, TREY ([id number]) on 10/21/2013 9:44:38 AM        Candyce Churn III, MD 10/21/13 [phone]

## 2013-10-21 NOTE — ED Notes (Signed)
Attempted to call report.  Rn was unaware of receiving pt. They will call back.

## 2013-10-21 NOTE — H&P (Addendum)
[facility] History and Physical  Jordan CleverlyBrenda E Semrad GNF:[id number] DOB: 06/01/1950 DOA: 10/21/2013  Referring physician: PCP: Doris CheadleADVANI, DEEPAK, MD  Specialists:   Chief Complaint: SOB, DOE  HPI: [name] is a 63 y.o. female HTN, DM 2, CKD, HLD, Anemia, CHF presented with worsening SOB, DOE, associated with leg edema, and some PND, orthopea for several days; denies chest pain, no cough, no fever, no nausea, vomiting or diarrhea, no focal neuro symptoms  -admitted with CHF exacerbation    Review of Systems: The patient denies anorexia, fever, weight loss,, vision loss, decreased hearing, hoarseness, chest pain, syncope,  balance deficits, hemoptysis, abdominal pain, melena, hematochezia, severe indigestion/heartburn, hematuria, incontinence, genital sores, muscle weakness, suspicious skin lesions, transient blindness, difficulty walking, depression, unusual weight change, abnormal bleeding, enlarged lymph nodes, angioedema, and breast masses.   Past Medical History  Diagnosis Date  . Hypertension   . Diabetes mellitus   . Anemia   . Hyperlipidemia   . Left ventricular hypertrophy    Past Surgical History  Procedure Laterality Date  . Cholecystectomy     Social History:  reports that she quit smoking about 47 years ago. Her smoking use included Cigarettes. She smoked 0.00 packs per day. She has never used smokeless tobacco. She reports that she does not drink alcohol or use illicit drugs. Home; where does patient live--home, ALF, SNF? and with whom if at home? Yes;  Can patient participate in ADLs?  Allergies  Allergen Reactions  . Nitrofurantoin Monohyd Macro Nausea Only    Family History  Problem Relation Age of Onset  . Diabetes type II    . Hypertension      (be sure to complete)  Prior to Admission medications   Medication Sig Start Date End Date Taking? Authorizing Provider  amLODipine (NORVASC) 5 MG tablet Take 1 tablet (5 mg total) by mouth every morning.  11/05/12  Yes Shade FloodJeffrey R Greene, MD  aspirin EC 81 MG tablet Take 81 mg by mouth every morning.   Yes Historical Provider, MD  brimonidine-timolol (COMBIGAN) 0.2-0.5 % ophthalmic solution Place 1 drop into both eyes 3 (three) times daily. 09/02/13  Yes Dorothea OgleIskra M Myers, MD  carvedilol (COREG) 6.25 MG tablet Take 1 tablet (6.25 mg total) by mouth 2 (two) times daily with a meal. 09/02/13  Yes Dorothea OgleIskra M Myers, MD  furosemide (LASIX) 20 MG tablet Take 1 tablet (20 mg total) by mouth daily. 09/02/13  Yes Dorothea OgleIskra M Myers, MD  glimepiride (AMARYL) 4 MG tablet Take 1 tablet (4 mg total) by mouth daily before breakfast. 09/02/13  Yes Dorothea OgleIskra M Myers, MD  pravastatin (PRAVACHOL) 40 MG tablet Take 1 tablet (40 mg total) by mouth daily. 09/02/13  Yes Dorothea OgleIskra M Myers, MD   Physical Exam: Filed Vitals:   10/21/13 1200  BP: 182/101  Pulse: 78  Temp:   Resp:      General:  alert  Eyes: eom-i  ENT: no oral ulcers   Neck: supple, +JVD  Cardiovascular: s1,s2 syst mr   Respiratory: LL crackles   Abdomen: soft, nt,nd   Skin: no rash   Musculoskeletal: LE edema  Psychiatric: no hallucinations   Neurologic: CN 2-12 intact, motor 5/5 BL   Labs on Admission:  Basic Metabolic Panel:  Recent Labs Lab 10/21/13 0938  NA 141  K 4.6  CL 107  CO2 22  GLUCOSE 222*  BUN 37*  CREATININE 2.96*  CALCIUM 8.7   Liver Function Tests:  Recent Labs Lab 10/21/13 [phone]  AST 55*  ALT 82*  ALKPHOS 138*  BILITOT 0.2*  PROT 6.1  ALBUMIN 3.0*   No results found for this basename: LIPASE, AMYLASE,  in the last 168 hours No results found for this basename: AMMONIA,  in the last 168 hours CBC:  Recent Labs Lab 10/21/13 0938  WBC 4.2  HGB 10.3*  HCT 31.3*  MCV 85.5  PLT 205   Cardiac Enzymes:  Recent Labs Lab 10/21/13 0938  TROPONINI <0.30    BNP (last 3 results)  Recent Labs  08/16/13 1758 08/20/13 1020 10/21/13 0938  PROBNP 15783.0* 8385.0* 32323.0*   CBG: No results found for this  basename: GLUCAP,  in the last 168 hours  Radiological Exams on Admission: Dg Chest 2 View  10/21/2013   CLINICAL DATA:  Short of breath  EXAM: CHEST  2 VIEW  COMPARISON:  08/16/2013  FINDINGS: Bilateral pleural effusions and bibasilar atelectasis are worse. Normal heart size. Vascular congestion. No pneumothorax.  IMPRESSION: Bilateral pleural effusions and bibasilar atelectasis have worsened.  Vascular congestion.   Electronically Signed   By: Maryclare Bean M.D.   On: 10/21/2013 10:24    EKG: Independently reviewed.   Assessment/Plan Principal Problem:   CHF exacerbation Active Problems:   DM2 (diabetes mellitus, type 2)   HTN (hypertension)  63 y.o. female HTN, DM 2, CKD, HLD, Anemia, CHF presented with worsening SOB, DOE, associated with leg edema, and some PND, orthopea for several days  1. Acute on chronic CHF; likely worse with progressive CKD; Pt was not taking enough diuretics at home  -echo (6/20145): LVEF 45%, grade 1 diastolic Dysfunction -star lasix IV 40 BID, strict I/o, daily weight; +/- metolazone prn; expect worsening renal function   -cotn BB, ASA, statin' r/o underlying CAD; not a candidate for LHC due to CKD: may need stress test prior to d/c 2. HTN uncontrolled likely contributing to CHF -increase amlodipine, carvedilol; add hydralazine +NTG'; titrate as needed  3. 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 DM, last HA1c-7.6; on glimepiride;  -hold PO hypoglycemics with CKD: ISS for now  5. 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 Anemia, likely AoCD; check iron profile    Cardiology;  if consultant consulted, please document name and whether formally or informally consulted  Code Status: full (must indicate code status--if unknown or must be presumed, indicate so) Family Communication: d/w patient, her daughter  (indicate person spoken with, if applicable, with phone number if by  telephone) Disposition Plan: home pend clinical improvement  (indicate anticipated LOS)  Time spent: >35 minutes   Esperanza Sheets [facility] [id number]  If 7PM-7AM, please contact night-coverage [email or web address] Password [email or web address] 10/21/2013, 1:26 PM

## 2013-10-21 NOTE — Care Management Note (Signed)
    Page 1 of 1   10/21/2013     3:58:43 PM CARE MANAGEMENT NOTE 10/21/2013  Patient:  [name], [name]   Account Number:  [id number]  Date Initiated:  10/21/2013  Documentation initiated by:  [facility]  Subjective/Objective Assessment:   63 y.o. female HTN, DM 2, CKD, HLD, Anemia, CHF presented with worsening SOB, DOE, associated with leg edema, and some PND, orthopea for several days.// Home with spouse.     Action/Plan:   lasix IV 40 BID, strict I/o, daily weight; +/- metolazone prn;  -cotn BB, ASA, statin' r/o underlying CAD; not a candidate for LHC due to CKD: may need stress test.//Access for [facility] services.   Anticipated DC Date:  10/25/2013   Anticipated DC Plan:  HOME W HOME HEALTH SERVICES      DC Planning Services  CM consult      Choice offered to / List presented to:  C-1 Patient           Status of service:  Completed, signed off Medicare Important Message given?   (If response is "NO", the following Medicare IM given date fields will be blank) Date Medicare IM given:   Medicare IM given by:   Date Additional Medicare IM given:   Additional Medicare IM given by:    Discharge Disposition:    Per UR Regulation:  Reviewed for med. necessity/level of care/duration of stay  If discussed at Long Length of Stay Meetings, dates discussed:    Comments:

## 2013-10-21 NOTE — Plan of Care (Signed)
Problem: Phase I Progression Outcomes Goal: EF % per last Echo/documented,Core Reminder form on chart Outcome: Completed/Met Date Met:  10/21/13 EF45-50%(08-17-13)

## 2013-10-21 NOTE — ED Notes (Signed)
Per pt sts SOB since last night. Pt speaking in short sentences and denying chest pain. Pt has sig swelling to BLE.

## 2013-10-21 NOTE — ED Notes (Signed)
Warm blankets given to pt.s daughter

## 2013-10-21 NOTE — ED Provider Notes (Signed)
I saw and evaluated the patient, reviewed the resident's note and I agree with the findings and plan.   EKG Interpretation   Date/Time:  Wednesday October 21 2013 08:47:37 EDT Ventricular Rate:  77 PR Interval:  120 QRS Duration: 128 QT Interval:  414 QTC Calculation: 468 R Axis:   -38 Text Interpretation:  Normal sinus rhythm Left axis deviation Right bundle  branch block Left ventricular hypertrophy with QRS widening T wave  abnormality, consider lateral ischemia Abnormal ECG No significant change  was found Confirmed by [facility]  MD, TREY ([id number]) on 10/21/2013 9:44:38 AM      63 yo female with history of CHF presenting with shortness of breath and dyspnea on exertion.  On exam, mild respiratory distress, alert, oriented, lungs with bibasilar rales, heart sounds normal with regular rate and rhythm, 1+ peripheral edema to bilateral lower extremities. Plan chest x-ray, last EKG. Suspect CHF exacerbation.  Admit.  Clinical Impression: 1. Systolic and diastolic CHF, acute   2. Acute combined systolic and diastolic heart failure   3. Renovascular hypertension   4. Uncontrolled type 2 diabetes mellitus with stage 4 chronic kidney disease       Candyce Churn III, MD 10/21/13 [phone]

## 2013-10-21 NOTE — ED Provider Notes (Signed)
CSN: [id number]     Arrival date & time 10/21/13  0845 History   First MD Initiated Contact with Patient 10/21/13 [phone]     Chief Complaint  Patient presents with  . Shortness of Breath   HPI [name] is a 63 yo woman with combined systolic and diastolic CHF (grade 1 diastolic dysfunction with EF 45-50% with an admission to [facility] in 08/2013), DM2 (HbA1c 7.6% in 08/2013), Stage IV CKD, HTN and HLD who is presenting with a 3-4 day history of difficulty catching her breath, occasional chest pressure, and increased swelling in her legs. Last night, both the shortness of breath and pressure got worse, to the point where she had trouble catching her breath with just several steps; she had difficulty sleeping due to SOB. At baseline, she uses 1 pillow to sleep and is able to walk several blocks without stopping. She denies increased fatigue, nausea, vomiting, chills, cough, recent illness, sick contacts. She took all of her medications this morning.  Past Medical History  Diagnosis Date  . Hypertension   . Diabetes mellitus   . Anemia   . Hyperlipidemia   . Left ventricular hypertrophy    Past Surgical History  Procedure Laterality Date  . Cholecystectomy     Family History  Problem Relation Age of Onset  . Diabetes type II    . Hypertension     History  Substance Use Topics  . Smoking status: Former Smoker    Types: Cigarettes    Quit date: 03/19/1966  . Smokeless tobacco: Never Used  . Alcohol Use: No   OB History   Grav Para Term Preterm Abortions TAB SAB Ect Mult Living                 Review of Systems General: no recent fatigue Skin: no rashes or lesions HEENT: no headache, no changes in vision Cardiac:  Chest pressure, but no chest pain, no palpitations Respiratory: see HPI, significant SOB above baseline GI: no changes in BMs Urinary: no dysuria, hematuria or frequency Vascular: no calf pain on walking Msk: no joint or muscle pain Psychiatric: no anxiety or  depression   Allergies  Nitrofurantoin monohyd macro  Home Medications   Prior to Admission medications   Medication Sig Start Date End Date Taking? Authorizing Provider  amLODipine (NORVASC) 5 MG tablet Take 1 tablet (5 mg total) by mouth every morning. 11/05/12   Shade Flood, MD  aspirin EC 81 MG tablet Take 81 mg by mouth every morning.    Historical Provider, MD  brimonidine-timolol (COMBIGAN) 0.2-0.5 % ophthalmic solution Place 1 drop into both eyes 3 (three) times daily. 09/02/13   Dorothea Ogle, MD  carvedilol (COREG) 6.25 MG tablet Take 1 tablet (6.25 mg total) by mouth 2 (two) times daily with a meal. 09/02/13   Dorothea Ogle, MD  furosemide (LASIX) 20 MG tablet Take 1 tablet (20 mg total) by mouth daily. 09/02/13   Dorothea Ogle, MD  glimepiride (AMARYL) 4 MG tablet Take 1 tablet (4 mg total) by mouth daily before breakfast. 09/02/13   Dorothea Ogle, MD  glucose blood test strip Use as instructed 11/17/12   Shade Flood, MD  Lancets [facility] ULTRASOFT) lancets Use as instructed 11/17/12   Shade Flood, MD  pravastatin (PRAVACHOL) 40 MG tablet Take 1 tablet (40 mg total) by mouth daily. 09/02/13   Dorothea Ogle, MD   BP 173/109  Pulse 81  Temp(Src) 97.5 F (  36.4 C) (Oral)  Resp 22  SpO2 100% Physical Exam Appearance: well-developed woman sitting with her grandson at bedside; unable to speak a full sentence without stopping to catch her breath HEENT: AT/[place], PERRL, EOMi Heart: RRR, normal S1S2, no MRG Lungs: CTAB with diffusely decreased breath sounds, no WRR Abdomen: +BS, soft, nontender, no hepatosplenomegaly Extremities: 1-2+ pitting edema b/l Neurologic: A&Ox3 Skin: no lesions or rashes  ED Course  Procedures (including critical care time) Labs Review Labs Reviewed  TROPONIN I    Imaging Review Dg Chest 2 View  10/21/2013   CLINICAL DATA:  Short of breath  EXAM: CHEST  2 VIEW  COMPARISON:  08/16/2013  FINDINGS: Bilateral pleural effusions and bibasilar  atelectasis are worse. Normal heart size. Vascular congestion. No pneumothorax.  IMPRESSION: Bilateral pleural effusions and bibasilar atelectasis have worsened.  Vascular congestion.   Electronically Signed   By: Maryclare BeanArt  Hoss M.D.   On: 10/21/2013 10:24     EKG Interpretation   Date/Time:  Wednesday October 21 2013 08:47:37 EDT Ventricular Rate:  77 PR Interval:  120 QRS Duration: 128 QT Interval:  414 QTC Calculation: 468 R Axis:   -38 Text Interpretation:  Normal sinus rhythm Left axis deviation Right bundle  branch block Left ventricular hypertrophy with QRS widening T wave  abnormality, consider lateral ischemia Abnormal ECG No significant change  was found Confirmed by [facility]  MD, TREY ([id number]) on 10/21/2013 9:44:38 AM      MDM   Final diagnoses:  Systolic and diastolic CHF, acute   This 63 yo woman with combined systolic and diastolic CHF (grade 1 diastolic dysfunction with EF 45-50%), DM2 (HbA1c 7.6% in 08/2013), Stage IV CKD, HTN and HLD is presenting with shortness of breath and chest pressure and has been found to have [place] abnormal [place] [address], one negative troponin and worsened bilateral pleural effusions with bibasilar atelectasis. She was also found to have elevated creatinine (near her baseline). She appears to be having a CHF exacerbation. She was given 20 mg IV lasix in the ED.    Dionne AnoJulia Cameran Ahmed, MD 10/21/13 1152  Dionne AnoJulia Paiden Caraveo, MD 10/21/13 [phone]

## 2013-10-22 DIAGNOSIS — M7989 Other specified soft tissue disorders: Secondary | ICD-10-CM

## 2013-10-22 DIAGNOSIS — I509 Heart failure, unspecified: Secondary | ICD-10-CM

## 2013-10-22 DIAGNOSIS — N289 Disorder of kidney and ureter, unspecified: Secondary | ICD-10-CM

## 2013-10-22 DIAGNOSIS — D508 Other iron deficiency anemias: Secondary | ICD-10-CM

## 2013-10-22 DIAGNOSIS — I428 Other cardiomyopathies: Secondary | ICD-10-CM

## 2013-10-22 DIAGNOSIS — N189 Chronic kidney disease, unspecified: Secondary | ICD-10-CM

## 2013-10-22 DIAGNOSIS — I129 Hypertensive chronic kidney disease with stage 1 through stage 4 chronic kidney disease, or unspecified chronic kidney disease: Secondary | ICD-10-CM | POA: Diagnosis present

## 2013-10-22 DIAGNOSIS — I10 Essential (primary) hypertension: Secondary | ICD-10-CM

## 2013-10-22 DIAGNOSIS — J90 Pleural effusion, not elsewhere classified: Secondary | ICD-10-CM | POA: Diagnosis present

## 2013-10-22 LAB — URINALYSIS, ROUTINE W REFLEX MICROSCOPIC
Bilirubin Urine: NEGATIVE
Glucose, UA: NEGATIVE mg/dL
HGB URINE DIPSTICK: NEGATIVE
Ketones, ur: NEGATIVE mg/dL
Leukocytes, UA: NEGATIVE
Nitrite: NEGATIVE
PROTEIN: 100 mg/dL — AB
Specific Gravity, Urine: 1.012 (ref 1.005–1.030)
UROBILINOGEN UA: 0.2 mg/dL (ref 0.0–1.0)
pH: 5.5 (ref 5.0–8.0)

## 2013-10-22 LAB — PROTEIN / CREATININE RATIO, URINE
CREATININE, URINE: 53.15 mg/dL
Protein Creatinine Ratio: 2.95 — ABNORMAL HIGH (ref 0.00–0.15)
TOTAL PROTEIN, URINE: 156.8 mg/dL

## 2013-10-22 LAB — BASIC METABOLIC PANEL
ANION GAP: 12 (ref 5–15)
BUN: 45 mg/dL — ABNORMAL HIGH (ref 6–23)
CO2: 22 mEq/L (ref 19–32)
Calcium: 8.6 mg/dL (ref 8.4–10.5)
Chloride: 105 mEq/L (ref 96–112)
Creatinine, Ser: 3.26 mg/dL — ABNORMAL HIGH (ref 0.50–1.10)
GFR calc non Af Amer: 14 mL/min — ABNORMAL LOW (ref >90)
GFR, EST AFRICAN AMERICAN: 16 mL/min — AB (ref >90)
Glucose, Bld: 147 mg/dL — ABNORMAL HIGH (ref 70–99)
POTASSIUM: 4.5 meq/L (ref 3.7–5.3)
Sodium: 139 mEq/L (ref 137–147)

## 2013-10-22 LAB — GLUCOSE, CAPILLARY
GLUCOSE-CAPILLARY: 169 mg/dL — AB (ref 70–99)
Glucose-Capillary: 134 mg/dL — ABNORMAL HIGH (ref 70–99)
Glucose-Capillary: 121 mg/dL — ABNORMAL HIGH (ref 70–99)
Glucose-Capillary: 122 mg/dL — ABNORMAL HIGH (ref 70–99)

## 2013-10-22 LAB — URINE MICROSCOPIC-ADD ON

## 2013-10-22 LAB — TROPONIN I: Troponin I: <0.3 ng/mL (ref <0.30)

## 2013-10-22 MED ORDER — FERROUS SULFATE 325 (65 FE) MG PO TABS
325.0000 mg | ORAL_TABLET | Freq: Two times a day (BID) | ORAL | Status: DC
  Administered 2013-10-22 – 2013-10-25 (×6): 325 mg via ORAL
  Filled 2013-10-22 (×8): qty 1

## 2013-10-22 MED ORDER — FUROSEMIDE 10 MG/ML IJ SOLN
60.0000 mg | Freq: Three times a day (TID) | INTRAMUSCULAR | Status: DC
  Administered 2013-10-22 – 2013-10-23 (×3): 60 mg via INTRAVENOUS
  Filled 2013-10-22 (×3): qty 6

## 2013-10-22 NOTE — Progress Notes (Signed)
VASCULAR LAB PRELIMINARY  PRELIMINARY  PRELIMINARY  PRELIMINARY  Bilateral lower extremity venous duplex  completed.    Preliminary report:  Bilateral:  No evidence of DVT, superficial thrombosis, or Baker's Cyst.    Jacaden Forbush, RVT 10/22/2013, 2:47 PM

## 2013-10-22 NOTE — Consult Note (Signed)
CONSULTATION NOTE  Reason for Consult: Heart failure  Requesting Physician: Dr. Tyrell Antonio  Cardiologist: Dr. Radford Pax  HPI: This is a 63 y.o. female with a past medical history significant for HTN, T2DM, dyslipidemia and renal insufficiency who was recently admitted for progressive dyspnea, orthopnea, bilateral leg edema, and chest discomfort. Patient was found to be in decompensated CHF and mildly elevated troponins likely secondary to decompensated CHF in the context of chronic kidney disease. Cardiology consulted. Echo demonstrated an EF of 45-50%. She also presented with hypertensive urgency. Troponins were elevated, however, she is noted to have CKD4.  Further ischemic work-up was not undertaken due to the inability to cath given significant renal failure. Outpatient stress test was recommended, but not performed.   She now presents with worsening dypnea, leg edema and pleural effusions. She did undergo a V/Q scan this admission which was low probability for PE. CXR shows bilateral pleural effusions and atelactasis. She is currently on lasix 40 mg IV BID. Initially, she was placed only on lasix 20 mg daily on discharge at 08/20/13. [place] [address], up from [place] at discharge. Troponins have been negative x 4. Creatinine is rising in the setting of diuresis - GFR is quite low at 14-16.   PMHx:  Past Medical History  Diagnosis Date  . Hypertension   . Diabetes mellitus   . Anemia   . Hyperlipidemia   . Left ventricular hypertrophy    Past Surgical History  Procedure Laterality Date  . Cholecystectomy      FAMHx: Family History  Problem Relation Age of Onset  . Diabetes type II    . Hypertension      SOCHx:  reports that she quit smoking about 47 years ago. Her smoking use included Cigarettes. She smoked 0.00 packs per day. She has never used smokeless tobacco. She reports that she does not drink alcohol or use illicit drugs.  ALLERGIES: Allergies  Allergen Reactions    . Nitrofurantoin Monohyd Macro Nausea Only    ROS: A comprehensive review of systems was negative except for: Respiratory: positive for dyspnea on exertion  HOME MEDICATIONS: Prescriptions prior to admission  Medication Sig Dispense Refill  . amLODipine (NORVASC) 5 MG tablet Take 1 tablet (5 mg total) by mouth every morning.  30 tablet  2  . aspirin EC 81 MG tablet Take 81 mg by mouth every morning.      . brimonidine-timolol (COMBIGAN) 0.2-0.5 % ophthalmic solution Place 1 drop into both eyes 3 (three) times daily.  1 Bottle  5  . carvedilol (COREG) 6.25 MG tablet Take 1 tablet (6.25 mg total) by mouth 2 (two) times daily with a meal.  60 tablet  7  . furosemide (LASIX) 20 MG tablet Take 1 tablet (20 mg total) by mouth daily.  30 tablet  7  . glimepiride (AMARYL) 4 MG tablet Take 1 tablet (4 mg total) by mouth daily before breakfast.  30 tablet  7  . pravastatin (PRAVACHOL) 40 MG tablet Take 1 tablet (40 mg total) by mouth daily.  30 tablet  7    HOSPITAL MEDICATIONS: I have reviewed the patient's current medications.  VITALS: Blood pressure 134/72, pulse 74, temperature 98.3 F (36.8 C), temperature source Oral, resp. rate 18, height 5' 1"  (1.549 m), weight 136 lb 1.6 oz (61.735 kg), SpO2 100.00%.  PHYSICAL EXAM: General appearance: alert and no distress Neck: no carotid bruit and no JVD Lungs: diminished breath sounds bilaterally Heart: regular rate and rhythm, S1,  S2 normal, no S3 or S4 and systolic murmur: early systolic 2/6, crescendo at apex Abdomen: soft, non-tender; bowel sounds normal; no masses,  no organomegaly Extremities: edema trace to 1+ bilateral Pulses: 2+ and symmetric Skin: Skin color, texture, turgor normal. No rashes or lesions Neurologic: Grossly normal Psych: Normal  LABS: Results for orders placed during the hospital encounter of 10/21/13 (from the past 48 hour(s))  TROPONIN I     Status: None   Collection Time    10/21/13  9:38 AM      Result  Value Ref Range   Troponin I <0.30  <0.30 ng/mL   Comment:            Due to the release kinetics of cTnI,     a negative result within the first hours     of the onset of symptoms does not rule out     myocardial infarction with certainty.     If myocardial infarction is still suspected,     repeat the test at appropriate intervals.  CBC     Status: Abnormal   Collection Time    10/21/13  9:38 AM      Result Value Ref Range   WBC 4.2  4.0 - 10.5 K/uL   RBC 3.66 (*) 3.87 - 5.11 MIL/uL   Hemoglobin 10.3 (*) 12.0 - 15.0 g/dL   HCT 31.3 (*) 36.0 - 46.0 %   MCV 85.5  78.0 - 100.0 fL   MCH 28.1  26.0 - 34.0 pg   MCHC 32.9  30.0 - 36.0 g/dL   RDW 14.2  11.5 - 15.5 %   Platelets 205  150 - 400 K/uL  COMPREHENSIVE METABOLIC PANEL     Status: Abnormal   Collection Time    10/21/13  9:38 AM      Result Value Ref Range   Sodium 141  137 - 147 mEq/L   Potassium 4.6  3.7 - 5.3 mEq/L   Chloride 107  96 - 112 mEq/L   CO2 22  19 - 32 mEq/L   Glucose, Bld 222 (*) 70 - 99 mg/dL   BUN 37 (*) 6 - 23 mg/dL   Creatinine, Ser 2.96 (*) 0.50 - 1.10 mg/dL   Calcium 8.7  8.4 - 10.5 mg/dL   Total Protein 6.1  6.0 - 8.3 g/dL   Albumin 3.0 (*) 3.5 - 5.2 g/dL   AST 55 (*) 0 - 37 U/L   ALT 82 (*) 0 - 35 U/L   Alkaline Phosphatase 138 (*) 39 - 117 U/L   Total Bilirubin 0.2 (*) 0.3 - 1.2 mg/dL   GFR calc non Af Amer 16 (*) >90 mL/min   GFR calc Af Amer 18 (*) >90 mL/min   Comment: (NOTE)     The eGFR has been calculated using the CKD EPI equation.     This calculation has not been validated in all clinical situations.     eGFR's persistently <90 mL/min signify possible Chronic Kidney     Disease.   Anion gap 12  5 - 15  PRO B NATRIURETIC PEPTIDE     Status: Abnormal   Collection Time    10/21/13  9:38 AM      Result Value Ref Range   Pro B Natriuretic peptide (BNP) 32323.0 (*) 0 - 125 pg/mL  GLUCOSE, CAPILLARY     Status: Abnormal   Collection Time    10/21/13  3:07 PM      Result Value Ref  Range   Glucose-Capillary 168 (*) 70 - 99 mg/dL   Comment 1 Notify RN    CBC     Status: Abnormal   Collection Time    10/21/13  4:00 PM      Result Value Ref Range   WBC 4.6  4.0 - 10.5 K/uL   RBC 4.00  3.87 - 5.11 MIL/uL   Hemoglobin 11.1 (*) 12.0 - 15.0 g/dL   HCT 34.1 (*) 36.0 - 46.0 %   MCV 85.3  78.0 - 100.0 fL   MCH 27.8  26.0 - 34.0 pg   MCHC 32.6  30.0 - 36.0 g/dL   RDW 14.2  11.5 - 15.5 %   Platelets 220  150 - 400 K/uL  CREATININE, SERUM     Status: Abnormal   Collection Time    10/21/13  4:00 PM      Result Value Ref Range   Creatinine, Ser 2.72 (*) 0.50 - 1.10 mg/dL   GFR calc non Af Amer 17 (*) >90 mL/min   GFR calc Af Amer 20 (*) >90 mL/min   Comment: (NOTE)     The eGFR has been calculated using the CKD EPI equation.     This calculation has not been validated in all clinical situations.     eGFR's persistently <90 mL/min signify possible Chronic Kidney     Disease.  FERRITIN     Status: None   Collection Time    10/21/13  4:00 PM      Result Value Ref Range   Ferritin 78  10 - 291 ng/mL   Comment: Performed at [facility] TIBC     Status: Abnormal   Collection Time    10/21/13  4:00 PM      Result Value Ref Range   Iron 39 (*) 42 - 135 ug/dL   TIBC 276  250 - 470 ug/dL   Saturation Ratios 14 (*) 20 - 55 %   UIBC 237  125 - 400 ug/dL   Comment: Performed at [facility]  TROPONIN I     Status: None   Collection Time    10/21/13  4:00 PM      Result Value Ref Range   Troponin I <0.30  <0.30 ng/mL   Comment:            Due to the release kinetics of cTnI,     a negative result within the first hours     of the onset of symptoms does not rule out     myocardial infarction with certainty.     If myocardial infarction is still suspected,     repeat the test at appropriate intervals.  TROPONIN I     Status: None   Collection Time    10/21/13  9:08 PM      Result Value Ref Range   Troponin I <0.30  <0.30 ng/mL   Comment:             Due to the release kinetics of cTnI,     a negative result within the first hours     of the onset of symptoms does not rule out     myocardial infarction with certainty.     If myocardial infarction is still suspected,     repeat the test at appropriate intervals.  GLUCOSE, CAPILLARY     Status: Abnormal   Collection Time    10/21/13  9:23 PM  Result Value Ref Range   Glucose-Capillary 209 (*) 70 - 99 mg/dL   Comment 1 Notify RN     Comment 2 Documented in Chart    TROPONIN I     Status: None   Collection Time    10/22/13  2:39 AM      Result Value Ref Range   Troponin I <0.30  <0.30 ng/mL   Comment:            Due to the release kinetics of cTnI,     a negative result within the first hours     of the onset of symptoms does not rule out     myocardial infarction with certainty.     If myocardial infarction is still suspected,     repeat the test at appropriate intervals.  BASIC METABOLIC PANEL     Status: Abnormal   Collection Time    10/22/13  2:39 AM      Result Value Ref Range   Sodium 139  137 - 147 mEq/L   Potassium 4.5  3.7 - 5.3 mEq/L   Chloride 105  96 - 112 mEq/L   CO2 22  19 - 32 mEq/L   Glucose, Bld 147 (*) 70 - 99 mg/dL   BUN 45 (*) 6 - 23 mg/dL   Creatinine, Ser 3.26 (*) 0.50 - 1.10 mg/dL   Calcium 8.6  8.4 - 10.5 mg/dL   GFR calc non Af Amer 14 (*) >90 mL/min   GFR calc Af Amer 16 (*) >90 mL/min   Comment: (NOTE)     The eGFR has been calculated using the CKD EPI equation.     This calculation has not been validated in all clinical situations.     eGFR's persistently <90 mL/min signify possible Chronic Kidney     Disease.   Anion gap 12  5 - 15  GLUCOSE, CAPILLARY     Status: Abnormal   Collection Time    10/22/13  6:14 AM      Result Value Ref Range   Glucose-Capillary 134 (*) 70 - 99 mg/dL   Comment 1 Notify RN     Comment 2 Documented in Chart    GLUCOSE, CAPILLARY     Status: Abnormal   Collection Time    10/22/13 11:31 AM       Result Value Ref Range   Glucose-Capillary 169 (*) 70 - 99 mg/dL   Comment 1 Notify RN      IMAGING: Dg Chest 2 View  10/21/2013   CLINICAL DATA:  Short of breath  EXAM: CHEST  2 VIEW  COMPARISON:  08/16/2013  FINDINGS: Bilateral pleural effusions and bibasilar atelectasis are worse. Normal heart size. Vascular congestion. No pneumothorax.  IMPRESSION: Bilateral pleural effusions and bibasilar atelectasis have worsened.  Vascular congestion.   Electronically Signed   By: Maryclare Bean M.D.   On: 10/21/2013 10:24   Nm Pulmonary Perf And Vent  10/21/2013   CLINICAL DATA:  Shortness of breath.  EXAM: NUCLEAR MEDICINE VENTILATION - PERFUSION LUNG SCAN  TECHNIQUE: Ventilation images were obtained in multiple projections using inhaled aerosol technetium 99 M DTPA. Perfusion images were obtained in multiple projections after intravenous injection of Tc-14mMAA.  RADIOPHARMACEUTICALS:  40 mCi Tc-936mTPA aerosol and 6 mCi Tc-9975mA  COMPARISON:  Chest x-ray dated 10/21/2013  FINDINGS: Ventilation: The ventilation exam is suboptimal with the patient holding much of the agent in the oropharynx and also swallowing some of the agent.  Perfusion: No perfusion defects. Decreased ventilation and perfusion of both lung bases due to the pleural effusions.  IMPRESSION: No evidence of pulmonary embolism. Poor ventilation component of the exam. However, the perfusion study is diagnostic.   Electronically Signed   By: Rozetta Nunnery M.D.   On: 10/21/2013 17:42    HOSPITAL DIAGNOSES: Principal Problem:   Acute combined systolic and diastolic heart failure Active Problems:   DM2 (diabetes mellitus, type 2)   HTN (hypertension)   RBBB   LVH (left ventricular hypertrophy)   Chronic kidney disease (CKD), stage IV (severe)   CHF (congestive heart failure)   Hypertensive nephropathy   Pleural effusion, bacterial   IMPRESSION: 1. Primarily acute on chronic renal failure, GFR 14 2. Combined systolic and diastolic heart  failure, NYHA Class IV, EF 45-50% 3. Dilated cardiomyopathy, suspicious for hypertensive cardiomyopathy 4. History of malignant hypertension  RECOMMENDATION: 1. This is likely a combination of heart failure, but more predominantly worsening renal failure. She noted at home that her urine output was diminishing, despite low dose lasix. Her creatinine here continues to climb with lasix, although she is breathing better. She has small to moderate bilateral pleural effusions. Her pulmonary edema is out of proportion to her degree of cardiomyopathy, suggesting that renal failure may be the driving factor.  Would continue lasix as you are for now, but recommend consulting nephrology - she may ultimately need ultrafiltration and/or dialysis. With regards to her recent hospitalization and mild troponin elevation, that was never worked-up. Troponins here have been negative. We have not excluded CAD, however, this is suspicious for non-ischemic cardiomyopathy. It would be reasonable to obtain a nuclear stress test prior to discharge. I think diuresis and renal evaluation are the highest priority, however.  Thanks for consulting [place]. We will follow with you.  Time Spent Directly with Patient: 45 minutes  Pixie Casino, MD, [facility] Attending Cardiologist [facility]  Kynnedy Carreno C 10/22/2013, 12:52 PM

## 2013-10-22 NOTE — Progress Notes (Signed)
[facility] PROGRESS NOTE  [name] JSR:[id number] DOB: 02-28-1951 DOA: 10/21/2013 PCP: Doris Cheadle, MD  Assessment/Plan: 1-Acute on chronic combine systolic and diastolic Heart failure:  Continue with IV lasix 40 mg BID.  Repeat Chest x ray after diuresis, if pleural effusion persist might need thoracentesis.  Monitor renal function.  Weight: 62----61 Urine out put 1.3.   2-CKD-IV; cr  At  3.. Per records Cr range: 2.2 to 3.5. She will need to establish care with nephrologist. Good urine out put.  Monitor renal function on lasix.   HTN uncontrolled: Continue with coreg, hydralazine, Norvasc, isosorbide.   DM, last HA1c-7.6: Hold PO hypoglycemics with CKD: ISS for now   LE edema likely due to CHF; r/o DVT.  VQ scan negative for PE.  Anemia, Iron deficiency, component of AoCD; Ferritin 71, iron 39. She will need outpatient screening colonoscopy. Will start Ferrous sulfate.    Code Status: Full Code.  Family Communication:  Disposition Plan: remain inpatient.    Consultants:  Cardiology.   Procedures:  none  Antibiotics:  none  HPI/Subjective: Breathing better, still with fluids LE.    Objective: Filed Vitals:   10/22/13 0617  BP: 169/88  Pulse: 74  Temp: 98.3 F (36.8 C)  Resp: 18    Intake/Output Summary (Last 24 hours) at 10/22/13 0752 Last data filed at 10/22/13 9244  Gross per 24 hour  Intake    340 ml  Output   1350 ml  Net  -1010 ml   Filed Weights   10/21/13 1459 10/22/13 0617  Weight: 62.642 kg (138 lb 1.6 oz) 61.735 kg (136 lb 1.6 oz)    Exam:   General:  Alert in no distress.   Cardiovascular: S 1, S 2 RRR  Respiratory: Bilateral air movement. Few crackles.   Abdomen: Bs present, soft, Distended.  Musculoskeletal: plus 2 edema  Data Reviewed: Basic Metabolic Panel:  Recent Labs Lab 10/21/13 0938 10/21/13 1600 10/22/13 0239  NA 141  --  139  K 4.6  --  4.5  CL 107  --  105  CO2 22  --  22  GLUCOSE 222*   --  147*  BUN 37*  --  45*  CREATININE 2.96* 2.72* 3.26*  CALCIUM 8.7  --  8.6   Liver Function Tests:  Recent Labs Lab 10/21/13 0938  AST 55*  ALT 82*  ALKPHOS 138*  BILITOT 0.2*  PROT 6.1  ALBUMIN 3.0*   No results found for this basename: LIPASE, AMYLASE,  in the last 168 hours No results found for this basename: AMMONIA,  in the last 168 hours CBC:  Recent Labs Lab 10/21/13 0938 10/21/13 1600  WBC 4.2 4.6  HGB 10.3* 11.1*  HCT 31.3* 34.1*  MCV 85.5 85.3  PLT 205 220   Cardiac Enzymes:  Recent Labs Lab 10/21/13 0938 10/21/13 1600 10/21/13 2108 10/22/13 0239  TROPONINI <0.30 <0.30 <0.30 <0.30   BNP (last 3 results)  Recent Labs  08/16/13 1758 08/20/13 1020 10/21/13 0938  PROBNP 15783.0* 8385.0* 32323.0*   CBG:  Recent Labs Lab 10/21/13 1507 10/21/13 2123  GLUCAP 168* 209*    No results found for this or any previous visit (from the past 240 hour(s)).   Studies: Dg Chest 2 View  10/21/2013   CLINICAL DATA:  Short of breath  EXAM: CHEST  2 VIEW  COMPARISON:  08/16/2013  FINDINGS: Bilateral pleural effusions and bibasilar atelectasis are worse. Normal heart size. Vascular congestion. No pneumothorax.  IMPRESSION: Bilateral  pleural effusions and bibasilar atelectasis have worsened.  Vascular congestion.   Electronically Signed   By: Maryclare BeanArt  Hoss M.D.   On: 10/21/2013 10:24   Nm Pulmonary Perf And Vent  10/21/2013   CLINICAL DATA:  Shortness of breath.  EXAM: NUCLEAR MEDICINE VENTILATION - PERFUSION LUNG SCAN  TECHNIQUE: Ventilation images were obtained in multiple projections using inhaled aerosol technetium 99 M DTPA. Perfusion images were obtained in multiple projections after intravenous injection of Tc-5175m MAA.  RADIOPHARMACEUTICALS:  40 mCi Tc-4075m DTPA aerosol and 6 mCi Tc-7275m MAA  COMPARISON:  Chest x-ray dated 10/21/2013  FINDINGS: Ventilation: The ventilation exam is suboptimal with the patient holding much of the agent in the oropharynx and also  swallowing some of the agent.  Perfusion: No perfusion defects. Decreased ventilation and perfusion of both lung bases due to the pleural effusions.  IMPRESSION: No evidence of pulmonary embolism. Poor ventilation component of the exam. However, the perfusion study is diagnostic.   Electronically Signed   By: Geanie CooleyJim  Maxwell M.D.   On: 10/21/2013 17:42    Scheduled Meds: . amLODipine  10 mg Oral q morning - 10a  . aspirin EC  81 mg Oral q morning - 10a  . brimonidine  1 drop Both Eyes 3 times per day   And  . timolol  1 drop Both Eyes 3 times per day  . carvedilol  12.5 mg Oral BID WC  . furosemide  40 mg Intravenous BID  . heparin  5,000 Units Subcutaneous 3 times per day  . hydrALAZINE  10 mg Oral 3 times per day  . insulin aspart  0-9 Units Subcutaneous TID WC  . isosorbide mononitrate  15 mg Oral Daily  . simvastatin  10 mg Oral q1800  . sodium chloride  3 mL Intravenous Q12H   Continuous Infusions:   Principal Problem:   CHF exacerbation Active Problems:   DM2 (diabetes mellitus, type 2)   HTN (hypertension)   CHF (congestive heart failure)    Time spent: 35 minutes.     [name], [name]  [facility] Pager [phone]. If 7PM-7AM, please contact night-coverage at [email or web address], password [email or web address] 10/22/2013, 7:52 AM  LOS: 1 day

## 2013-10-22 NOTE — Consult Note (Signed)
[name] 10/22/2013 [name], B Requesting Physician:  [name]  Reason for Consult:  Elevated SCr, SOB, LEE, anemia HPI:  28F admitted to [facility] 5/31-6/4 w/ A/C CHF with volume overload treated with diuretics and ? Of CAD but no ACS admitted yesterday with at least 5d of worsening LEE, increase SOB, esp with exertion, PND, and fatigue.  Pt was taking 20mg  PO daily lasix upon previous discharge.  Imaging here with increased b/l pleural effusions.  V/Q yesterday w/o PE.   Pt w/ CKD4 based upon labs (05/2012 has SCr mid 2's) and has fluctuated in past several weeks between 2.8-3.5).  Pt seen at PMD 6/17 with SCr 3.25 and states she felt well then.  Prev imaging with 2 kidneys and findings c/w medical renal disease. Has had UA w/ protein but not recent and not quantified.  Pt does recall hx/o diabetic eye disease and mabye laser therapy.  She does not take NSAIDs.  DM2 and HTN date back about 20y.  She does not clearly eat a low Na diet. Weight is up at least 3kg from discharge at [facility].  She states she 'ran out' of one medications just before presentign, perhaps was lasix.   No gross hematuria, sudsy urine, unusual joint pains/rashes, sores in mouth/nose, epistaxis.     Creat (mg/dL)  Date Value  06/25/8117 3.25*  11/05/2012 1.82*  05/21/2012 1.95*  04/02/2012 1.26*  06/05/2011 1.08   05/08/2011 1.33*  04/24/2011 1.22*     Creatinine, Ser (mg/dL)  Date Value  03/23/7827 3.26*  10/21/2013 2.72*  10/21/2013 2.96*  08/20/2013 3.28*  08/19/2013 3.50*  08/18/2013 3.42*  08/17/2013 2.86*  08/16/2013 2.81*  05/18/2012 2.29*  05/18/2012 2.46*  ] I/Os:  ROS NSAIDS: none IV Contrast none TMP/SMX none Hypotension none Balance of 12 systems is negative w/ exceptions as above  PMH  Past Medical History  Diagnosis Date  . Hypertension   . Diabetes mellitus   . Anemia   . Hyperlipidemia   . Left ventricular hypertrophy    PSH  Past Surgical History  Procedure Laterality Date  . Cholecystectomy      FH  Family History  Problem Relation Age of Onset  . Diabetes type II    . Hypertension     SH  reports that she quit smoking about 47 years ago. Her smoking use included Cigarettes. She smoked 0.00 packs per day. She has never used smokeless tobacco. She reports that she does not drink alcohol or use illicit drugs. Allergies  Allergies  Allergen Reactions  . Nitrofurantoin Monohyd Macro Nausea Only   Home medications Prior to Admission medications   Medication Sig Start Date End Date Taking? Authorizing Provider  amLODipine (NORVASC) 5 MG tablet Take 1 tablet (5 mg total) by mouth every morning. 11/05/12  Yes Jordan Flood, MD  aspirin EC 81 MG tablet Take 81 mg by mouth every morning.   Yes Historical Provider, MD  brimonidine-timolol (COMBIGAN) 0.2-0.5 % ophthalmic solution Place 1 drop into both eyes 3 (three) times daily. 09/02/13  Yes Jordan Ogle, MD  carvedilol (COREG) 6.25 MG tablet Take 1 tablet (6.25 mg total) by mouth 2 (two) times daily with a meal. 09/02/13  Yes Jordan Ogle, MD  furosemide (LASIX) 20 MG tablet Take 1 tablet (20 mg total) by mouth daily. 09/02/13  Yes Jordan Ogle, MD  glimepiride (AMARYL) 4 MG tablet Take 1 tablet (4 mg total) by mouth daily before breakfast. 09/02/13  Yes Jordan M  Izola PriceMyers, MD  pravastatin (PRAVACHOL) 40 MG tablet Take 1 tablet (40 mg total) by mouth daily. 09/02/13  Yes Jordan OgleIskra M Myers, MD    Current Medications Current Facility-Administered Medications  Medication Dose Route Frequency Provider Last Rate Last Dose  . acetaminophen (TYLENOL) tablet 650 mg  650 mg Oral Q6H PRN Jordan SheetsUlugbek N Buriev, MD       Or  . acetaminophen (TYLENOL) suppository 650 mg  650 mg Rectal Q6H PRN Jordan SheetsUlugbek N Buriev, MD      . amLODipine (NORVASC) tablet 10 mg  10 mg Oral q morning - 10a Jordan SheetsUlugbek N Buriev, MD   10 mg at 10/22/13 0942  . aspirin EC tablet 81 mg  81 mg Oral q morning - 10a Jordan SheetsUlugbek N Buriev, MD   81 mg at 10/22/13 0942  . brimonidine (ALPHAGAN) 0.2 %  ophthalmic solution 1 drop  1 drop Both Eyes 3 times per day Jordan SheetsUlugbek N Buriev, MD   1 drop at 10/22/13 0731   And  . timolol (TIMOPTIC) 0.5 % ophthalmic solution 1 drop  1 drop Both Eyes 3 times per day Jordan SheetsUlugbek N Buriev, MD   1 drop at 10/22/13 0730  . carvedilol (COREG) tablet 12.5 mg  12.5 mg Oral BID WC Jordan SheetsUlugbek N Buriev, MD   12.5 mg at 10/22/13 0749  . ferrous sulfate tablet 325 mg  325 mg Oral BID WC Jordan A Regalado, MD      . furosemide (LASIX) injection 40 mg  40 mg Intravenous BID Jordan SheetsUlugbek N Buriev, MD   40 mg at 10/22/13 0841  . heparin injection 5,000 Units  5,000 Units Subcutaneous 3 times per day Jordan SheetsUlugbek N Buriev, MD   5,000 Units at 10/22/13 0750  . hydrALAZINE (APRESOLINE) tablet 10 mg  10 mg Oral 3 times per day Jordan SheetsUlugbek N Buriev, MD   10 mg at 10/22/13 0750  . insulin aspart (novoLOG) injection 0-9 Units  0-9 Units Subcutaneous TID WC Jordan SheetsUlugbek N Buriev, MD   2 Units at 10/22/13 1225  . isosorbide mononitrate (IMDUR) 24 hr tablet 15 mg  15 mg Oral Daily Jordan SheetsUlugbek N Buriev, MD   15 mg at 10/22/13 0943  . ondansetron (ZOFRAN) tablet 4 mg  4 mg Oral Q6H PRN Jordan SheetsUlugbek N Buriev, MD       Or  . ondansetron (ZOFRAN) injection 4 mg  4 mg Intravenous Q6H PRN Jordan SheetsUlugbek N Buriev, MD      . simvastatin (ZOCOR) tablet 10 mg  10 mg Oral q1800 Jordan SheetsUlugbek N Buriev, MD   10 mg at 10/21/13 1748  . sodium chloride 0.9 % injection 3 mL  3 mL Intravenous Q12H Jordan SheetsUlugbek N Buriev, MD   3 mL at 10/22/13 1001    CBC  Recent Labs Lab 10/21/13 0938 10/21/13 1600  WBC 4.2 4.6  HGB 10.3* 11.1*  HCT 31.3* 34.1*  MCV 85.5 85.3  PLT 205 220   Basic Metabolic Panel  Recent Labs Lab 10/21/13 0938 10/21/13 1600 10/22/13 0239  NA 141  --  139  K 4.6  --  4.5  CL 107  --  105  CO2 22  --  22  GLUCOSE 222*  --  147*  BUN 37*  --  45*  CREATININE 2.96* 2.72* 3.26*  CALCIUM 8.7  --  8.6    Physical Exam  Blood pressure 137/81, pulse 68, temperature 97.9 F (36.6 C), temperature source Oral, resp. rate 18,  height 5\' 1"  (1.549 m), weight 61.735 kg (136 lb  1.6 oz), SpO2 100.00%. GEN: NAD ENT: NCAT NECK: no JVD EYES: EOMI CV: RRR. Nl s1s2. No mgr. PULM: diminished in bases ABD: s/nt/nd, no bruits. No hsm SKIN: no rashes/lesions EXT:2+ LEE, pitting, to proximal calf b/l  Assessment 60M presenting with A/C CHF exacerbation, volume overload (pleural effusions, LEE), and progressive CKD w/ proteinuria in setting of longstanding uncontrolled DM2 and HTN.   1. CKD4, Current SCr could be within limitis of her new and labile baseline; not sure AKI 2. Proteinuria 3. HTN, uncontrlled 4. Volume Overload 5. A/C CHF Exacerbation 6. Normocytic Anemia, Hb 11.4 7. DM2, last A1c 7.6% 08/17/13  PLAN 1. No indication for RRT at this point  2. Increase lasix to 60 IV TID 3. Dieticiain consult to instruct on low Na diet 4. UA and UP/C 5. Check PTH 6. Daily Renal Panel 7. Monitor Hb 8. Will need close f/u with [place] 9. SPEP and sFLC  Jordan Heck MD 10/22/2013, 2:14 PM

## 2013-10-23 LAB — CBC
HCT: 29.8 % — ABNORMAL LOW (ref 36.0–46.0)
HEMOGLOBIN: 9.8 g/dL — AB (ref 12.0–15.0)
MCH: 28.3 pg (ref 26.0–34.0)
MCHC: 32.9 g/dL (ref 30.0–36.0)
MCV: 86.1 fL (ref 78.0–100.0)
Platelets: 195 10*3/uL (ref 150–400)
RBC: 3.46 MIL/uL — ABNORMAL LOW (ref 3.87–5.11)
RDW: 14.2 % (ref 11.5–15.5)
WBC: 4.1 10*3/uL (ref 4.0–10.5)

## 2013-10-23 LAB — RENAL FUNCTION PANEL
Albumin: 2.7 g/dL — ABNORMAL LOW (ref 3.5–5.2)
Anion gap: 15 (ref 5–15)
BUN: 49 mg/dL — ABNORMAL HIGH (ref 6–23)
CHLORIDE: 106 meq/L (ref 96–112)
CO2: 22 mEq/L (ref 19–32)
CREATININE: 3.51 mg/dL — AB (ref 0.50–1.10)
Calcium: 8.5 mg/dL (ref 8.4–10.5)
GFR calc Af Amer: 15 mL/min — ABNORMAL LOW (ref >90)
GFR, EST NON AFRICAN AMERICAN: 13 mL/min — AB (ref >90)
GLUCOSE: 81 mg/dL (ref 70–99)
Phosphorus: 4.3 mg/dL (ref 2.3–4.6)
Potassium: 3.8 mEq/L (ref 3.7–5.3)
Sodium: 143 mEq/L (ref 137–147)

## 2013-10-23 LAB — KAPPA/LAMBDA LIGHT CHAINS
KAPPA, LAMDA LIGHT CHAIN RATIO: 1.37 (ref 0.26–1.65)
Kappa free light chain: 5.85 mg/dL — ABNORMAL HIGH (ref 0.33–1.94)
LAMDA FREE LIGHT CHAINS: 4.27 mg/dL — AB (ref 0.57–2.63)

## 2013-10-23 LAB — GLUCOSE, CAPILLARY
GLUCOSE-CAPILLARY: 85 mg/dL (ref 70–99)
GLUCOSE-CAPILLARY: 122 mg/dL — AB (ref 70–99)
Glucose-Capillary: 116 mg/dL — ABNORMAL HIGH (ref 70–99)
Glucose-Capillary: 122 mg/dL — ABNORMAL HIGH (ref 70–99)

## 2013-10-23 MED ORDER — FUROSEMIDE 80 MG PO TABS
80.0000 mg | ORAL_TABLET | Freq: Two times a day (BID) | ORAL | Status: DC
  Administered 2013-10-23 – 2013-10-25 (×4): 80 mg via ORAL
  Filled 2013-10-23 (×6): qty 1

## 2013-10-23 MED ORDER — SODIUM CHLORIDE 0.9 % IV SOLN
1020.0000 mg | Freq: Once | INTRAVENOUS | Status: AC
  Administered 2013-10-23: 1020 mg via INTRAVENOUS
  Filled 2013-10-23: qty 34

## 2013-10-23 NOTE — Progress Notes (Signed)
Patient alert and oriented x4 throughout shift; vital signs stable.  Patient made aware of plan for stress test tomorrow and NPO scheduled at midnight.  Patient denies any questions or concerns at this time.  Will continue to monitor.

## 2013-10-23 NOTE — Progress Notes (Signed)
Nutrition Education Note  RD consulted for Low Sodium Diet education. During education Dr. Briant Cedar (nephrologist) requested pt also receive low potassium and low phosphorus diet education.  RD provided " Heart Healthy Nutrition Therapy" handout from the Academy of [employer]. Reviewed patient's dietary recall. Provided examples on ways to decrease sodium intake in diet. Discouraged intake of processed foods and use of salt shaker. Encouraged fresh fruits and vegetables.  Explained why diet restrictions are needed and provided lists of foods to limit/avoid that are high potassium, sodium, and phosphorus. Provided specific recommendations on safer alternatives of these foods. RD worked with patient to figure out a variety of safe staple foods for patient to consume regularly. Sample menu provided.   RD contact information provided. Encouraged pt to contact RD with any additional diet questions/concerns. Teach back method used.  Expect good compliance.  Body mass index is 25.24 kg/(m^2). Pt meets criteria for Overweight based on current BMI.  Current diet order is Heart Healthy/Carb Modified, patient is consuming approximately 75% of meals at this time. Labs and medications reviewed. No further nutrition interventions warranted at this time. If additional nutrition issues arise, please re-consult RD.  [name] RD, LDN Inpatient Clinical Dietitian Pager: [phone] After Hours Pager: [phone]

## 2013-10-23 NOTE — Progress Notes (Signed)
Patient Name: Jordan CleverlyBrenda E Degroote Date of Encounter: 10/23/2013     Principal Problem:   Acute combined systolic and diastolic heart failure Active Problems:   DM2 (diabetes mellitus, type 2)   HTN (hypertension), malignant   RBBB   LVH (left ventricular hypertrophy)   Chronic kidney disease (CKD), stage IV (severe)   CHF (congestive heart failure)   Hypertensive nephropathy   Pleural effusion, bilateral    SUBJECTIVE  The patient feels much better this morning.  She has diuresed 5 pounds overnight.  Denies chest pain or shortness of breath.  Telemetry shows normal sinus rhythm.  CURRENT MEDS . amLODipine  10 mg Oral q morning - 10a  . aspirin EC  81 mg Oral q morning - 10a  . brimonidine  1 drop Both Eyes 3 times per day   And  . timolol  1 drop Both Eyes 3 times per day  . carvedilol  12.5 mg Oral BID WC  . ferrous sulfate  325 mg Oral BID WC  . furosemide  60 mg Intravenous TID  . heparin  5,000 Units Subcutaneous 3 times per day  . hydrALAZINE  10 mg Oral 3 times per day  . insulin aspart  0-9 Units Subcutaneous TID WC  . isosorbide mononitrate  15 mg Oral Daily  . simvastatin  10 mg Oral q1800  . sodium chloride  3 mL Intravenous Q12H    OBJECTIVE  Filed Vitals:   10/22/13 1606 10/22/13 2215 10/23/13 0500 10/23/13 0922  BP: 150/80 148/82 151/81 143/78  Pulse:  75 76 73  Temp:  98.3 F (36.8 C) 97.8 F (36.6 C)   TempSrc:  Oral Oral   Resp:  18 17 18   Height:      Weight:   133 lb 8 oz (60.555 kg)   SpO2:  100% 100% 97%    Intake/Output Summary (Last 24 hours) at 10/23/13 0938 Last data filed at 10/23/13 0925  Gross per 24 hour  Intake    243 ml  Output   1400 ml  Net  -1157 ml   Filed Weights   10/21/13 1459 10/22/13 0617 10/23/13 0500  Weight: 138 lb 1.6 oz (62.642 kg) 136 lb 1.6 oz (61.735 kg) 133 lb 8 oz (60.555 kg)    PHYSICAL EXAM  General: Pleasant, NAD. Neuro: Alert and oriented X 3. Moves all extremities spontaneously. Psych: Normal  affect. HEENT:  Normal  Neck: Supple without bruits or JVD. Lungs:  Resp regular and unlabored, decreased breath sounds at bases.  No wheezing. Heart: RRR no s3, s4, soft systolic murmur at apex. Abdomen: Soft, non-tender, non-distended, BS + x 4.  Extremities: No clubbing, cyanosis or edema. DP/PT/Radials 2+ and equal bilaterally.  Accessory Clinical Findings  CBC  Recent Labs  10/21/13 1600 10/23/13 0509  WBC 4.6 4.1  HGB 11.1* 9.8*  HCT 34.1* 29.8*  MCV 85.3 86.1  PLT 220 195   Basic Metabolic Panel  Recent Labs  10/22/13 0239 10/23/13 0509  NA 139 143  K 4.5 3.8  CL 105 106  CO2 22 22  GLUCOSE 147* 81  BUN 45* 49*  CREATININE 3.26* 3.51*  CALCIUM 8.6 8.5  PHOS  --  4.3   Liver Function Tests  Recent Labs  10/21/13 0938 10/23/13 0509  AST 55*  --   ALT 82*  --   ALKPHOS 138*  --   BILITOT 0.2*  --   PROT 6.1  --   ALBUMIN 3.0* 2.7*  No results found for this basename: LIPASE, AMYLASE,  in the last 72 hours Cardiac Enzymes  Recent Labs  10/21/13 1600 10/21/13 2108 10/22/13 0239  TROPONINI <0.30 <0.30 <0.30   BNP No components found with this basename: POCBNP,  D-Dimer No results found for this basename: DDIMER,  in the last 72 hours Hemoglobin A1C No results found for this basename: HGBA1C,  in the last 72 hours Fasting Lipid Panel No results found for this basename: CHOL, HDL, LDLCALC, TRIG, CHOLHDL, LDLDIRECT,  in the last 72 hours Thyroid Function Tests No results found for this basename: TSH, T4TOTAL, FREET3, T3FREE, THYROIDAB,  in the last 72 hours  TELE  Normal sinus rhythm  ECG  Normal sinus rhythm.  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  Radiology/Studies  Dg Chest 2 View  10/21/2013   CLINICAL DATA:  Short of breath  EXAM: CHEST  2 VIEW  COMPARISON:  08/16/2013  FINDINGS: Bilateral pleural effusions and bibasilar atelectasis are worse. Normal heart size. Vascular congestion. No  pneumothorax.  IMPRESSION: Bilateral pleural effusions and bibasilar atelectasis have worsened.  Vascular congestion.   Electronically Signed   By: Maryclare Bean M.D.   On: 10/21/2013 10:24   Nm Pulmonary Perf And Vent  10/21/2013   CLINICAL DATA:  Shortness of breath.  EXAM: NUCLEAR MEDICINE VENTILATION - PERFUSION LUNG SCAN  TECHNIQUE: Ventilation images were obtained in multiple projections using inhaled aerosol technetium 99 M DTPA. Perfusion images were obtained in multiple projections after intravenous injection of Tc-22m MAA.  RADIOPHARMACEUTICALS:  40 mCi Tc-29m DTPA aerosol and 6 mCi Tc-9m MAA  COMPARISON:  Chest x-ray dated 10/21/2013  FINDINGS: Ventilation: The ventilation exam is suboptimal with the patient holding much of the agent in the oropharynx and also swallowing some of the agent.  Perfusion: No perfusion defects. Decreased ventilation and perfusion of both lung bases due to the pleural effusions.  IMPRESSION: No evidence of pulmonary embolism. Poor ventilation component of the exam. However, the perfusion study is diagnostic.   Electronically Signed   By: Geanie Cooley M.D.   On: 10/21/2013 17:42    ASSESSMENT AND PLAN 1.  Acute on chronic renal failure, 2. combined systolic and diastolic heart failure acute on chronic with EF 45-50% 3. 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 history of malignant hypertension.   Plan: Continue current therapy.  Nephrology on board.  We'll arrange for [facility] during this admission as per Dr. Blanchie Dessert note.  It had been scheduled after last admission for outpatient but did not occur.  Troponins were elevated on her last admission but normal this admission. Signed, Cassell Clement MD

## 2013-10-23 NOTE — Progress Notes (Signed)
S: Breathing better. No uremic sxs O:BP 143/78  Pulse 73  Temp(Src) 97.8 F (36.6 C) (Oral)  Resp 18  Ht 5\' 1"  (1.549 m)  Wt 60.555 kg (133 lb 8 oz)  BMI 25.24 kg/m2  SpO2 97%  Intake/Output Summary (Last 24 hours) at 10/23/13 0936 Last data filed at 10/23/13 0925  Gross per 24 hour  Intake    243 ml  Output   1400 ml  Net  -1157 ml   Weight change: -2.087 kg (-4 lb 9.6 oz) WUJ:WJXBJGen:Awake and alert CVS:RRR Resp:decrease BS bases Abd:+ BS NTND Ext:1+ edema NEURO:CNI Ox3 no asterixis   . amLODipine  10 mg Oral q morning - 10a  . aspirin EC  81 mg Oral q morning - 10a  . brimonidine  1 drop Both Eyes 3 times per day   And  . timolol  1 drop Both Eyes 3 times per day  . carvedilol  12.5 mg Oral BID WC  . ferrous sulfate  325 mg Oral BID WC  . furosemide  60 mg Intravenous TID  . heparin  5,000 Units Subcutaneous 3 times per day  . hydrALAZINE  10 mg Oral 3 times per day  . insulin aspart  0-9 Units Subcutaneous TID WC  . isosorbide mononitrate  15 mg Oral Daily  . simvastatin  10 mg Oral q1800  . sodium chloride  3 mL Intravenous Q12H   Dg Chest 2 View  10/21/2013   CLINICAL DATA:  Short of breath  EXAM: CHEST  2 VIEW  COMPARISON:  08/16/2013  FINDINGS: Bilateral pleural effusions and bibasilar atelectasis are worse. Normal heart size. Vascular congestion. No pneumothorax.  IMPRESSION: Bilateral pleural effusions and bibasilar atelectasis have worsened.  Vascular congestion.   Electronically Signed   By: Maryclare BeanArt  Hoss M.D.   On: 10/21/2013 10:24   Nm Pulmonary Perf And Vent  10/21/2013   CLINICAL DATA:  Shortness of breath.  EXAM: NUCLEAR MEDICINE VENTILATION - PERFUSION LUNG SCAN  TECHNIQUE: Ventilation images were obtained in multiple projections using inhaled aerosol technetium 99 M DTPA. Perfusion images were obtained in multiple projections after intravenous injection of Tc-4268m MAA.  RADIOPHARMACEUTICALS:  40 mCi Tc-2868m DTPA aerosol and 6 mCi Tc-4768m MAA  COMPARISON:  Chest x-ray  dated 10/21/2013  FINDINGS: Ventilation: The ventilation exam is suboptimal with the patient holding much of the agent in the oropharynx and also swallowing some of the agent.  Perfusion: No perfusion defects. Decreased ventilation and perfusion of both lung bases due to the pleural effusions.  IMPRESSION: No evidence of pulmonary embolism. Poor ventilation component of the exam. However, the perfusion study is diagnostic.   Electronically Signed   By: Geanie CooleyJim  Maxwell M.D.   On: 10/21/2013 17:42   BMET    Component Value Date/Time   NA 143 10/23/2013 0509   K 3.8 10/23/2013 0509   CL 106 10/23/2013 0509   CO2 22 10/23/2013 0509   GLUCOSE 81 10/23/2013 0509   BUN 49* 10/23/2013 0509   CREATININE 3.51* 10/23/2013 0509   CREATININE 3.25* 09/02/2013 1512   CALCIUM 8.5 10/23/2013 0509   GFRNONAA 13* 10/23/2013 0509   GFRAA 15* 10/23/2013 0509   CBC    Component Value Date/Time   WBC 4.1 10/23/2013 0509   WBC 4.2* 11/05/2012 1059   RBC 3.46* 10/23/2013 0509   RBC 3.52* 08/18/2013 1211   RBC 4.17 11/05/2012 1059   HGB 9.8* 10/23/2013 0509   HGB 11.7* 11/05/2012 1059   HCT 29.8* 10/23/2013  0509   HCT 37.0* 11/05/2012 1059   PLT 195 10/23/2013 0509   MCV 86.1 10/23/2013 0509   MCV 88.8 11/05/2012 1059   MCH 28.3 10/23/2013 0509   MCH 28.1 11/05/2012 1059   MCHC 32.9 10/23/2013 0509   MCHC 31.6* 11/05/2012 1059   RDW 14.2 10/23/2013 0509   LYMPHSABS 1.6 09/02/2013 1512   MONOABS 0.6 09/02/2013 1512   EOSABS 0.2 09/02/2013 1512   BASOSABS 0.0 09/02/2013 1512     Assessment: 1. CKD 4 most likely sec to DM/HTN 2. Vol overload 3. Anemia 4. HTN 5. DM  Plan: 1. Change lasix to PO 2. IV iron 3. Discussed the inevitability of HD in the future.  Will show her dialysis videos to start the education process 4. Dietitian in room and spoke with to educate pt on low PO4, low K diet 5. Recheck Scr in AM   [name]

## 2013-10-23 NOTE — Progress Notes (Signed)
[facility] PROGRESS NOTE  [name] NUU:[id number] DOB: 06-02-1950 DOA: 10/21/2013 PCP: Doris Cheadle, MD  Assessment/Plan: 1-Acute on chronic combine systolic and diastolic Heart failure:  Repeat Chest x ray after diuresis, if pleural effusion persist might need thoracentesis.  Monitor renal function.  Weight: 62----61---60 Urine out put 1.4 L. Repeat Chest x ray 8-8 to follow up pleural effusion.  Received IV lasix 60 mg TID. Started on oral lasix 80 mg BID.  Cardiology planning stress test tomorrow.   2-CKD-IV; cr  At  3.. Per records Cr range: 2.2 to 3.5.   Monitor renal function on lasix.  Appreciate Dr Briant Cedar help. Now on 80 mg PO lasix BID.   HTN uncontrolled: Continue with coreg, hydralazine, Norvasc, isosorbide.   DM, last HA1c-7.6: Hold PO hypoglycemics with CKD: ISS for now   LE edema likely due to CHF; doppler negative for DVT.  VQ scan negative for PE.  Anemia, Iron deficiency, component of AoCD; Ferritin 71, iron 39. She will need outpatient screening colonoscopy.  IV iron.   Transaminases; repeat LFT in am   Code Status: Full Code.  Family Communication:  Disposition Plan: remain inpatient.    Consultants:  Cardiology.   Procedures:  none  Antibiotics:  none  HPI/Subjective: Feeling much better, breathing better, no dyspnea on exertion.    Objective: Filed Vitals:   10/23/13 1334  BP: 132/72  Pulse: 77  Temp: 98.6 F (37 C)  Resp: 18    Intake/Output Summary (Last 24 hours) at 10/23/13 1701 Last data filed at 10/23/13 1435  Gross per 24 hour  Intake    483 ml  Output   2400 ml  Net  -1917 ml   Filed Weights   10/21/13 1459 10/22/13 0617 10/23/13 0500  Weight: 62.642 kg (138 lb 1.6 oz) 61.735 kg (136 lb 1.6 oz) 60.555 kg (133 lb 8 oz)    Exam:   General:  Alert in no distress.   Cardiovascular: S 1, S 2 RRR  Respiratory: Bilateral air movement. CTA  Abdomen: Bs present, soft,  Distended.  Musculoskeletal; trace edema.   Data Reviewed: Basic Metabolic Panel:  Recent Labs Lab 10/21/13 0938 10/21/13 1600 10/22/13 0239 10/23/13 0509  NA 141  --  139 143  K 4.6  --  4.5 3.8  CL 107  --  105 106  CO2 22  --  22 22  GLUCOSE 222*  --  147* 81  BUN 37*  --  45* 49*  CREATININE 2.96* 2.72* 3.26* 3.51*  CALCIUM 8.7  --  8.6 8.5  PHOS  --   --   --  4.3   Liver Function Tests:  Recent Labs Lab 10/21/13 0938 10/23/13 0509  AST 55*  --   ALT 82*  --   ALKPHOS 138*  --   BILITOT 0.2*  --   PROT 6.1  --   ALBUMIN 3.0* 2.7*   No results found for this basename: LIPASE, AMYLASE,  in the last 168 hours No results found for this basename: AMMONIA,  in the last 168 hours CBC:  Recent Labs Lab 10/21/13 0938 10/21/13 1600 10/23/13 0509  WBC 4.2 4.6 4.1  HGB 10.3* 11.1* 9.8*  HCT 31.3* 34.1* 29.8*  MCV 85.5 85.3 86.1  PLT 205 220 195   Cardiac Enzymes:  Recent Labs Lab 10/21/13 0938 10/21/13 1600 10/21/13 2108 10/22/13 0239  TROPONINI <0.30 <0.30 <0.30 <0.30   BNP (last 3 results)  Recent Labs  08/16/13 1758 08/20/13  1020 10/21/13 0938  PROBNP 15783.0* 8385.0* 32323.0*   CBG:  Recent Labs Lab 10/22/13 1629 10/22/13 2209 10/23/13 0612 10/23/13 1103 10/23/13 1646  GLUCAP 121* 122* 85 122* 116*    No results found for this or any previous visit (from the past 240 hour(s)).   Studies: Nm Pulmonary Perf And Vent  10/21/2013   CLINICAL DATA:  Shortness of breath.  EXAM: NUCLEAR MEDICINE VENTILATION - PERFUSION LUNG SCAN  TECHNIQUE: Ventilation images were obtained in multiple projections using inhaled aerosol technetium 99 M DTPA. Perfusion images were obtained in multiple projections after intravenous injection of Tc-4979m MAA.  RADIOPHARMACEUTICALS:  40 mCi Tc-8579m DTPA aerosol and 6 mCi Tc-8879m MAA  COMPARISON:  Chest x-ray dated 10/21/2013  FINDINGS: Ventilation: The ventilation exam is suboptimal with the patient holding much of the  agent in the oropharynx and also swallowing some of the agent.  Perfusion: No perfusion defects. Decreased ventilation and perfusion of both lung bases due to the pleural effusions.  IMPRESSION: No evidence of pulmonary embolism. Poor ventilation component of the exam. However, the perfusion study is diagnostic.   Electronically Signed   By: Geanie CooleyJim  Maxwell M.D.   On: 10/21/2013 17:42    Scheduled Meds: . amLODipine  10 mg Oral q morning - 10a  . aspirin EC  81 mg Oral q morning - 10a  . brimonidine  1 drop Both Eyes 3 times per day   And  . timolol  1 drop Both Eyes 3 times per day  . carvedilol  12.5 mg Oral BID WC  . ferrous sulfate  325 mg Oral BID WC  . furosemide  80 mg Oral BID  . heparin  5,000 Units Subcutaneous 3 times per day  . hydrALAZINE  10 mg Oral 3 times per day  . insulin aspart  0-9 Units Subcutaneous TID WC  . isosorbide mononitrate  15 mg Oral Daily  . simvastatin  10 mg Oral q1800  . sodium chloride  3 mL Intravenous Q12H   Continuous Infusions:   Principal Problem:   Acute combined systolic and diastolic heart failure Active Problems:   DM2 (diabetes mellitus, type 2)   HTN (hypertension), malignant   RBBB   LVH (left ventricular hypertrophy)   Chronic kidney disease (CKD), stage IV (severe)   CHF (congestive heart failure)   Hypertensive nephropathy   Pleural effusion, bilateral    Time spent: 35 minutes.     Hartley Barefootegalado, Kwane Rohl A  [facility] Pager [phone]. If 7PM-7AM, please contact night-coverage at [email or web address], password [email or web address] 10/23/2013, 5:01 PM  LOS: 2 days

## 2013-10-24 ENCOUNTER — Inpatient Hospital Stay (HOSPITAL_COMMUNITY): Payer: Medicare HMO

## 2013-10-24 DIAGNOSIS — I517 Cardiomegaly: Secondary | ICD-10-CM

## 2013-10-24 DIAGNOSIS — I4510 Unspecified right bundle-branch block: Secondary | ICD-10-CM

## 2013-10-24 DIAGNOSIS — R079 Chest pain, unspecified: Secondary | ICD-10-CM

## 2013-10-24 LAB — GLUCOSE, CAPILLARY
GLUCOSE-CAPILLARY: 107 mg/dL — AB (ref 70–99)
Glucose-Capillary: 139 mg/dL — ABNORMAL HIGH (ref 70–99)
Glucose-Capillary: 132 mg/dL — ABNORMAL HIGH (ref 70–99)
Glucose-Capillary: 113 mg/dL — ABNORMAL HIGH (ref 70–99)

## 2013-10-24 LAB — HEPATIC FUNCTION PANEL
ALT: 34 U/L (ref 0–35)
AST: 14 U/L (ref 0–37)
Albumin: 2.8 g/dL — ABNORMAL LOW (ref 3.5–5.2)
Alkaline Phosphatase: 106 U/L (ref 39–117)
Total Protein: 5.9 g/dL — ABNORMAL LOW (ref 6.0–8.3)

## 2013-10-24 LAB — BASIC METABOLIC PANEL
Anion gap: 15 (ref 5–15)
BUN: 50 mg/dL — ABNORMAL HIGH (ref 6–23)
CALCIUM: 9.1 mg/dL (ref 8.4–10.5)
CO2: 23 mEq/L (ref 19–32)
CREATININE: 3.88 mg/dL — AB (ref 0.50–1.10)
Chloride: 104 mEq/L (ref 96–112)
GFR calc non Af Amer: 11 mL/min — ABNORMAL LOW (ref >90)
GFR, EST AFRICAN AMERICAN: 13 mL/min — AB (ref >90)
Glucose, Bld: 98 mg/dL (ref 70–99)
Potassium: 4.1 mEq/L (ref 3.7–5.3)
Sodium: 142 mEq/L (ref 137–147)

## 2013-10-24 MED ORDER — TECHNETIUM TC 99M SESTAMIBI GENERIC - CARDIOLITE
30.0000 | Freq: Once | INTRAVENOUS | Status: AC | PRN
  Administered 2013-10-24: 30 via INTRAVENOUS

## 2013-10-24 MED ORDER — REGADENOSON 0.4 MG/5ML IV SOLN
0.4000 mg | Freq: Once | INTRAVENOUS | Status: AC
  Administered 2013-10-24: 0.4 mg via INTRAVENOUS
  Filled 2013-10-24: qty 5

## 2013-10-24 MED ORDER — HYDRALAZINE HCL 25 MG PO TABS
25.0000 mg | ORAL_TABLET | Freq: Three times a day (TID) | ORAL | Status: DC
  Administered 2013-10-24 – 2013-10-25 (×4): 25 mg via ORAL
  Filled 2013-10-24 (×7): qty 1

## 2013-10-24 MED ORDER — REGADENOSON 0.4 MG/5ML IV SOLN
INTRAVENOUS | Status: AC
  Administered 2013-10-24: 0.4 mg via INTRAVENOUS
  Filled 2013-10-24: qty 5

## 2013-10-24 MED ORDER — TECHNETIUM TC 99M SESTAMIBI GENERIC - CARDIOLITE
10.0000 | Freq: Once | INTRAVENOUS | Status: AC | PRN
  Administered 2013-10-24: 10 via INTRAVENOUS

## 2013-10-24 NOTE — Progress Notes (Signed)
lexiscan given with PA Lyman Bishop present

## 2013-10-24 NOTE — Progress Notes (Signed)
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  Received IV lasix 60 mg TID. on oral lasix 80 mg BID.  Stress test : No evidence for pharmacological induced ischemia. Findings are suggestive for an area of infarct. Cardiology recommending Medical treatment.   2-CKD-IV; cr  At  3.. Per records Cr range: 2.2 to 3.5.   Monitor renal function on lasix.  Appreciate Dr Briant Cedar help. Now on 80 mg PO lasix BID.  Cr increase today, plan to repeat renal function in am.   HTN uncontrolled: Continue with coreg, hydralazine, Norvasc, isosorbide.   DM, last HA1c-7.6: Hold PO hypoglycemics with CKD: ISS for now   LE edema likely due to CHF; doppler negative for DVT.  VQ scan negative for PE.  Anemia, Iron deficiency, component of AoCD; Ferritin 71, iron 39. She will need outpatient screening colonoscopy.  IV iron.   Transaminases; resolved. Suspect hepatic congestion.   Code Status: Full Code.  Family Communication:  Disposition Plan: remain inpatient.    Consultants:  Cardiology.   Procedures:  none  Antibiotics:  none  HPI/Subjective: Breathing better. No chest pain. No dyspnea on exertion.    Objective: Filed Vitals:   10/24/13 1358  BP: 148/83  Pulse:   Temp:   Resp:     Intake/Output Summary (Last 24 hours) at 10/24/13 1405 Last data filed at 10/24/13 1354  Gross per 24 hour  Intake    483 ml  Output   2050 ml  Net  -1567 ml   Filed Weights   10/22/13 0617 10/23/13 0500 10/24/13 0557  Weight: 61.735 kg (136 lb 1.6 oz) 60.555 kg (133 lb 8 oz) 59.058 kg (130 lb 3.2 oz)    Exam:   General:  Alert in no distress.   Cardiovascular: S 1, S 2 RRR  Respiratory: Bilateral air movement.  CTA  Abdomen: Bs present, soft, Distended.  Musculoskeletal; trace edema.   Data Reviewed: Basic Metabolic Panel:  Recent Labs Lab 10/21/13 0938 10/21/13 1600 10/22/13 0239 10/23/13 0509 10/24/13 0347  NA 141  --  139 143 142  K 4.6  --  4.5 3.8 4.1  CL 107  --  105 106 104  CO2 22  --  22 22 23   GLUCOSE 222*  --  147* 81 98  BUN 37*  --  45* 49* 50*  CREATININE 2.96* 2.72* 3.26* 3.51* 3.88*  CALCIUM 8.7  --  8.6 8.5 9.1  PHOS  --   --   --  4.3  --    Liver Function Tests:  Recent Labs Lab 10/21/13 0938 10/23/13 0509 10/24/13 0347  AST 55*  --  14  ALT 82*  --  34  ALKPHOS 138*  --  106  BILITOT 0.2*  --  <0.2*  PROT 6.1  --  5.9*  ALBUMIN 3.0* 2.7* 2.8*   No results found for this basename: LIPASE, AMYLASE,  in the last 168 hours No results found for this basename: AMMONIA,  in the last 168 hours CBC:  Recent Labs Lab 10/21/13 0938 10/21/13 1600 10/23/13 0509  WBC 4.2 4.6 4.1  HGB 10.3* 11.1* 9.8*  HCT 31.3* 34.1* 29.8*  MCV 85.5 85.3 86.1  PLT 205 220 195   Cardiac Enzymes:  Recent Labs Lab 10/21/13  [id number] 10/21/13 1600 10/21/13 2108 10/22/13 0239  TROPONINI <0.30 <0.30 <0.30 <0.30   BNP (last 3 results)  Recent Labs  08/16/13 1758 08/20/13 1020 10/21/13 0938  PROBNP 15783.0* 8385.0* 32323.0*   CBG:  Recent Labs Lab 10/23/13 1103 10/23/13 1646 10/23/13 2133 10/24/13 0601 10/24/13 1122  GLUCAP 122* 116* 122* 107* 139*    No results found for this or any previous visit (from the past 240 hour(s)).   Studies: Dg Chest 2 View  10/24/2013   CLINICAL DATA:  Follow-up pleural effusion  EXAM: CHEST  2 VIEW  COMPARISON:  Two-view chest 10/21/2013.  FINDINGS: The heart is enlarged. Moderate bilateral pleural effusions have improved slightly. Bibasilar opacification is evident. Mild pulmonary vascular congestion has improved. The upper lung fields are clear.  IMPRESSION: 1. Improving moderate bilateral pleural effusions. 2. Bibasilar  airspace disease likely reflects atelectasis. Infection is not excluded. 3. Improving bilateral pulmonary vascular congestion.   Electronically Signed   By: Gennette Pachris  Mattern M.D.   On: 10/24/2013 07:20   Nm Myocar Multi W/spect W/wall Motion / Ef  10/24/2013   CLINICAL DATA:  63 year old with chest pain.  EXAM: MYOCARDIAL IMAGING WITH SPECT (REST AND PHARMACOLOGIC-STRESS)  GATED LEFT VENTRICULAR WALL MOTION STUDY  LEFT VENTRICULAR EJECTION FRACTION  TECHNIQUE: Standard myocardial SPECT imaging performed after resting intravenous injection of 10 mCi Tc-4029m sestimibi. Subsequently, intravenous infusion of regadenoson performed under the supervision of the Cardiology staff. At peak effect of the drug, 30 mCi Tc-7129m sestimibi injected intravenously and standard myocardial SPECT imaging performed. Quantitative gated imaging also performed to evaluate left ventricular wall motion and estimate left ventricular ejection fraction.  FINDINGS: MYOCARDIAL IMAGING WITH SPECT (REST AND PHARMACOLOGIC-STRESS)  There is decreased uptake along the inferolateral wall in the mid and basal segments. There is gut uptake identified on the stress images. There is no evidence for reversibility or ischemia.  GATED LEFT VENTRICULAR WALL MOTION STUDY  Review of the gated images demonstrates marked hypokinesia in the inferior wall. There is mild hypokinesia at the apex and lateral wall. No significant wall motion abnormality in the anterior and septal walls.  LEFT VENTRICULAR EJECTION FRACTION  QGS ejection fraction measures 40% , with an end-diastolic volume of 140 ml and an end-systolic volume of 84 ml.  IMPRESSION: No evidence for pharmacological induced ischemia.  Fixed defect involving the inferolateral wall with associated wall motion abnormalities. Findings are suggestive for an area of infarct.  Calculated ejection fraction is 40%.   Electronically Signed   By: Richarda OverlieAdam  Henn M.D.   On: 10/24/2013 12:21    Scheduled Meds: . amLODipine   10 mg Oral q morning - 10a  . aspirin EC  81 mg Oral q morning - 10a  . brimonidine  1 drop Both Eyes 3 times per day   And  . timolol  1 drop Both Eyes 3 times per day  . carvedilol  12.5 mg Oral BID WC  . ferrous sulfate  325 mg Oral BID WC  . furosemide  80 mg Oral BID  . heparin  5,000 Units Subcutaneous 3 times per day  . hydrALAZINE  25 mg Oral 3 times per day  . insulin aspart  0-9 Units Subcutaneous TID WC  . isosorbide mononitrate  15 mg Oral Daily  . simvastatin  10 mg Oral q1800  . sodium chloride  3 mL Intravenous Q12H   Continuous Infusions:   Principal Problem:   Acute combined systolic and diastolic heart failure Active Problems:  DM2 (diabetes mellitus, type 2)   HTN (hypertension), malignant   RBBB   LVH (left ventricular hypertrophy)   Chronic kidney disease (CKD), stage IV (severe)   CHF (congestive heart failure)   Hypertensive nephropathy   Pleural effusion, bilateral    Time spent: 30 minutes.     Hartley Barefoot A  [facility] Pager [phone]. If 7PM-7AM, please contact night-coverage at [email or web address], password [facility] 10/24/2013, 2:05 PM  LOS: 3 days

## 2013-10-24 NOTE — Progress Notes (Signed)
S: No new CO O:BP 153/87  Pulse 75  Temp(Src) 98.1 F (36.7 C) (Oral)  Resp 18  Ht 5\' 1"  (1.549 m)  Wt 59.058 kg (130 lb 3.2 oz)  BMI 24.61 kg/m2  SpO2 98%  Intake/Output Summary (Last 24 hours) at 10/24/13 0915 Last data filed at 10/24/13 0857  Gross per 24 hour  Intake   1097 ml  Output   2900 ml  Net  -1803 ml   Weight change: -1.497 kg (-3 lb 4.8 oz) HUT:MLYYT and alert CVS:RRR Resp:decrease BS bases Abd:+ BS NTND Ext: 0-tr edema NEURO:CNI Ox3 no asterixis   . amLODipine  10 mg Oral q morning - 10a  . aspirin EC  81 mg Oral q morning - 10a  . brimonidine  1 drop Both Eyes 3 times per day   And  . timolol  1 drop Both Eyes 3 times per day  . carvedilol  12.5 mg Oral BID WC  . ferrous sulfate  325 mg Oral BID WC  . furosemide  80 mg Oral BID  . heparin  5,000 Units Subcutaneous 3 times per day  . hydrALAZINE  10 mg Oral 3 times per day  . insulin aspart  0-9 Units Subcutaneous TID WC  . isosorbide mononitrate  15 mg Oral Daily  . regadenoson      . regadenoson  0.4 mg Intravenous Once  . simvastatin  10 mg Oral q1800  . sodium chloride  3 mL Intravenous Q12H   Dg Chest 2 View  10/24/2013   CLINICAL DATA:  Follow-up pleural effusion  EXAM: CHEST  2 VIEW  COMPARISON:  Two-view chest 10/21/2013.  FINDINGS: The heart is enlarged. Moderate bilateral pleural effusions have improved slightly. Bibasilar opacification is evident. Mild pulmonary vascular congestion has improved. The upper lung fields are clear.  IMPRESSION: 1. Improving moderate bilateral pleural effusions. 2. Bibasilar airspace disease likely reflects atelectasis. Infection is not excluded. 3. Improving bilateral pulmonary vascular congestion.   Electronically Signed   By: Gennette Pac M.D.   On: 10/24/2013 07:20   BMET    Component Value Date/Time   NA 142 10/24/2013 0347   K 4.1 10/24/2013 0347   CL 104 10/24/2013 0347   CO2 23 10/24/2013 0347   GLUCOSE 98 10/24/2013 0347   BUN 50* 10/24/2013 0347   CREATININE  3.88* 10/24/2013 0347   CREATININE 3.25* 09/02/2013 1512   CALCIUM 9.1 10/24/2013 0347   GFRNONAA 11* 10/24/2013 0347   GFRAA 13* 10/24/2013 0347   CBC    Component Value Date/Time   WBC 4.1 10/23/2013 0509   WBC 4.2* 11/05/2012 1059   RBC 3.46* 10/23/2013 0509   RBC 3.52* 08/18/2013 1211   RBC 4.17 11/05/2012 1059   HGB 9.8* 10/23/2013 0509   HGB 11.7* 11/05/2012 1059   HCT 29.8* 10/23/2013 0509   HCT 37.0* 11/05/2012 1059   PLT 195 10/23/2013 0509   MCV 86.1 10/23/2013 0509   MCV 88.8 11/05/2012 1059   MCH 28.3 10/23/2013 0509   MCH 28.1 11/05/2012 1059   MCHC 32.9 10/23/2013 0509   MCHC 31.6* 11/05/2012 1059   RDW 14.2 10/23/2013 0509   LYMPHSABS 1.6 09/02/2013 1512   MONOABS 0.6 09/02/2013 1512   EOSABS 0.2 09/02/2013 1512   BASOSABS 0.0 09/02/2013 1512     Assessment: 1. CKD 4 most likely sec to DM/HTN.  Scr sl higher with diuresis 2. Vol overload 3. Anemia, received IV iron 4. HTN 5. DM  Plan: 1. Recheck Scr  in AM 2,.  She will need outpt FU 3. Await PTH level 4.  See how Hg responds to IV iron   Carlester Kasparek T

## 2013-10-24 NOTE — Progress Notes (Signed)
Patient Name: [name] Date of Encounter: 10/24/2013     Principal Problem:   Acute combined systolic and diastolic heart failure Active Problems:   DM2 (diabetes mellitus, type 2)   HTN (hypertension), malignant   RBBB   LVH (left ventricular hypertrophy)   Chronic kidney disease (CKD), stage IV (severe)   CHF (congestive heart failure)   Hypertensive nephropathy   Pleural effusion, bilateral    SUBJECTIVE  Breathing has improved. Weight down to 130 lbs. Probably has CKD 4. Appreciate nephrology recommendations regarding diuretics. Nuclear stress test demonstrated no ischemia, probable inferior infarct and EF 40%.  Echo showed EF of 45-50%.  CURRENT MEDS . amLODipine  10 mg Oral q morning - 10a  . aspirin EC  81 mg Oral q morning - 10a  . brimonidine  1 drop Both Eyes 3 times per day   And  . timolol  1 drop Both Eyes 3 times per day  . carvedilol  12.5 mg Oral BID WC  . ferrous sulfate  325 mg Oral BID WC  . furosemide  80 mg Oral BID  . heparin  5,000 Units Subcutaneous 3 times per day  . hydrALAZINE  10 mg Oral 3 times per day  . insulin aspart  0-9 Units Subcutaneous TID WC  . isosorbide mononitrate  15 mg Oral Daily  . simvastatin  10 mg Oral q1800  . sodium chloride  3 mL Intravenous Q12H    OBJECTIVE  Filed Vitals:   10/24/13 0937 10/24/13 0939 10/24/13 0940 10/24/13 1123  BP: 154/69 128/64 127/61 153/73  Pulse: 80 80 78   Temp:      TempSrc:      Resp:      Height:      Weight:      SpO2:        Intake/Output Summary (Last 24 hours) at 10/24/13 1258 Last data filed at 10/24/13 1127  Gross per 24 hour  Intake    723 ml  Output   2500 ml  Net  -1777 ml   Filed Weights   10/22/13 0617 10/23/13 0500 10/24/13 0557  Weight: 136 lb 1.6 oz (61.735 kg) 133 lb 8 oz (60.555 kg) 130 lb 3.2 oz (59.058 kg)    PHYSICAL EXAM  General: Pleasant, NAD. Neuro: Alert and oriented X 3. Moves all extremities spontaneously. Psych: Normal affect. HEENT:   Normal  Neck: Supple without bruits or JVD. Lungs:  Resp regular and unlabored, decreased breath sounds at bases.  No wheezing. Heart: RRR no s3, s4, soft systolic murmur at apex. Abdomen: Soft, non-tender, non-distended, BS + x 4.  Extremities: No clubbing, cyanosis or edema. DP/PT/Radials 2+ and equal bilaterally.  Accessory Clinical Findings  CBC  Recent Labs  10/21/13 1600 10/23/13 0509  WBC 4.6 4.1  HGB 11.1* 9.8*  HCT 34.1* 29.8*  MCV 85.3 86.1  PLT 220 195   Basic Metabolic Panel  Recent Labs  10/22/13 0239 10/23/13 0509 10/24/13 0347  NA 139 143 142  K 4.5 3.8 4.1  CL 105 106 104  CO2 22 22 23   GLUCOSE 147* 81 98  BUN 45* 49* 50*  CREATININE 3.26* 3.51* 3.88*  CALCIUM 8.6 8.5 9.1  PHOS  --  4.3  --    Liver Function Tests  Recent Labs  10/23/13 0509 10/24/13 0347  AST  --  14  ALT  --  34  ALKPHOS  --  106  BILITOT  --  <0.2*  PROT  --  5.9*  ALBUMIN 2.7* 2.8*   No results found for this basename: LIPASE, AMYLASE,  in the last 72 hours Cardiac Enzymes  Recent Labs  10/21/13 1600 10/21/13 2108 10/22/13 0239  TROPONINI <0.30 <0.30 <0.30   BNP No components found with this basename: POCBNP,  D-Dimer No results found for this basename: DDIMER,  in the last 72 hours Hemoglobin A1C No results found for this basename: HGBA1C,  in the last 72 hours Fasting Lipid Panel No results found for this basename: CHOL, HDL, LDLCALC, TRIG, CHOLHDL, LDLDIRECT,  in the last 72 hours Thyroid Function Tests No results found for this basename: TSH, T4TOTAL, FREET3, T3FREE, THYROIDAB,  in the last 72 hours  TELE  Normal sinus rhythm  ECG  Normal sinus rhythm.  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  Radiology/Studies  Dg Chest 2 View  10/21/2013   CLINICAL DATA:  Short of breath  EXAM: CHEST  2 VIEW  COMPARISON:  08/16/2013  FINDINGS: Bilateral pleural effusions and bibasilar atelectasis are worse. Normal heart  size. Vascular congestion. No pneumothorax.  IMPRESSION: Bilateral pleural effusions and bibasilar atelectasis have worsened.  Vascular congestion.   Electronically Signed   By: Maryclare Bean M.D.   On: 10/21/2013 10:24   Nm Pulmonary Perf And Vent  10/21/2013   CLINICAL DATA:  Shortness of breath.  EXAM: NUCLEAR MEDICINE VENTILATION - PERFUSION LUNG SCAN  TECHNIQUE: Ventilation images were obtained in multiple projections using inhaled aerosol technetium 99 M DTPA. Perfusion images were obtained in multiple projections after intravenous injection of Tc-49m MAA.  RADIOPHARMACEUTICALS:  40 mCi Tc-72m DTPA aerosol and 6 mCi Tc-5m MAA  COMPARISON:  Chest x-ray dated 10/21/2013  FINDINGS: Ventilation: The ventilation exam is suboptimal with the patient holding much of the agent in the oropharynx and also swallowing some of the agent.  Perfusion: No perfusion defects. Decreased ventilation and perfusion of both lung bases due to the pleural effusions.  IMPRESSION: No evidence of pulmonary embolism. Poor ventilation component of the exam. However, the perfusion study is diagnostic.   Electronically Signed   By: Geanie Cooley M.D.   On: 10/21/2013 17:42    ASSESSMENT AND PLAN 1.  Acute on chronic renal failure, 2. combined systolic and diastolic heart failure acute on chronic with EF 45-50% 3. 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 history of malignant hypertension.   Plan:  Nuclear stress test did not demonstrate ischemia, rather a prior inferior infarct. She is not having chest pain. Would continue with medical therapy for now given her advanced kidney disease. Blood pressure remains elevated, would recommend increasing her hydralazine to 25 mg TID.  Chrystie Nose, MD, [facility] Attending Cardiologist [facility]

## 2013-10-24 NOTE — Progress Notes (Signed)
Pre vital signs priod to lexiscan

## 2013-10-24 NOTE — Progress Notes (Signed)
Patient alert and oriented x4 throughout shift.  Vital signs stable.  Patient denies any questions or concerns at this time.  Will continue to monitor. 

## 2013-10-25 LAB — RENAL FUNCTION PANEL
ANION GAP: 14 (ref 5–15)
Albumin: 2.9 g/dL — ABNORMAL LOW (ref 3.5–5.2)
BUN: 47 mg/dL — AB (ref 6–23)
CO2: 25 meq/L (ref 19–32)
Calcium: 9.4 mg/dL (ref 8.4–10.5)
Chloride: 102 mEq/L (ref 96–112)
Creatinine, Ser: 3.43 mg/dL — ABNORMAL HIGH (ref 0.50–1.10)
GFR calc Af Amer: 15 mL/min — ABNORMAL LOW (ref >90)
GFR calc non Af Amer: 13 mL/min — ABNORMAL LOW (ref >90)
GLUCOSE: 99 mg/dL (ref 70–99)
PHOSPHORUS: 4.3 mg/dL (ref 2.3–4.6)
POTASSIUM: 4 meq/L (ref 3.7–5.3)
Sodium: 141 mEq/L (ref 137–147)

## 2013-10-25 LAB — GLUCOSE, CAPILLARY
Glucose-Capillary: 114 mg/dL — ABNORMAL HIGH (ref 70–99)
Glucose-Capillary: 183 mg/dL — ABNORMAL HIGH (ref 70–99)

## 2013-10-25 MED ORDER — GLIMEPIRIDE 4 MG PO TABS: 2.0000 mg | ORAL_TABLET | Freq: Every day | ORAL | Status: DC

## 2013-10-25 MED ORDER — FUROSEMIDE 80 MG PO TABS: 80.0000 mg | ORAL_TABLET | Freq: Two times a day (BID) | ORAL | Status: DC

## 2013-10-25 MED ORDER — HYDRALAZINE HCL 25 MG PO TABS: 25.0000 mg | ORAL_TABLET | Freq: Three times a day (TID) | ORAL | Status: DC

## 2013-10-25 MED ORDER — FERROUS SULFATE 325 (65 FE) MG PO TABS: 325.0000 mg | ORAL_TABLET | Freq: Two times a day (BID) | ORAL | Status: DC

## 2013-10-25 MED ORDER — CARVEDILOL 12.5 MG PO TABS: 12.5000 mg | ORAL_TABLET | Freq: Two times a day (BID) | ORAL | Status: DC

## 2013-10-25 MED ORDER — AMLODIPINE BESYLATE 10 MG PO TABS: 10.0000 mg | ORAL_TABLET | Freq: Every morning | ORAL | Status: DC

## 2013-10-25 MED ORDER — ISOSORBIDE MONONITRATE 15 MG HALF TABLET: 15.0000 mg | ORAL_TABLET | Freq: Every day | ORAL | Status: DC

## 2013-10-25 MED ORDER — GLIMEPIRIDE 1 MG PO TABS: 1.0000 mg | ORAL_TABLET | Freq: Every day | ORAL | Status: DC

## 2013-10-25 NOTE — Progress Notes (Signed)
S: No new CO.  She wants to go home O:BP 161/61  Pulse 75  Temp(Src) 98.3 F (36.8 C) (Oral)  Resp 18  Ht 5\' 1"  (1.549 m)  Wt 56.972 kg (125 lb 9.6 oz)  BMI 23.74 kg/m2  SpO2 98%  Intake/Output Summary (Last 24 hours) at 10/25/13 [id number] Last data filed at 10/25/13 0600  Gross per 24 hour  Intake    963 ml  Output   2000 ml  Net  -1037 ml   Weight change: -2.087 kg (-4 lb 9.6 oz) MWN:UUVOZGen:Awake and alert CVS:RRR Resp:decrease BS bases Abd:+ BS NTND Ext: No edema NEURO:CNI Ox3 no asterixis   . amLODipine  10 mg Oral q morning - 10a  . aspirin EC  81 mg Oral q morning - 10a  . brimonidine  1 drop Both Eyes 3 times per day   And  . timolol  1 drop Both Eyes 3 times per day  . carvedilol  12.5 mg Oral BID WC  . ferrous sulfate  325 mg Oral BID WC  . furosemide  80 mg Oral BID  . heparin  5,000 Units Subcutaneous 3 times per day  . hydrALAZINE  25 mg Oral 3 times per day  . insulin aspart  0-9 Units Subcutaneous TID WC  . isosorbide mononitrate  15 mg Oral Daily  . simvastatin  10 mg Oral q1800  . sodium chloride  3 mL Intravenous Q12H   Dg Chest 2 View  10/24/2013   CLINICAL DATA:  Follow-up pleural effusion  EXAM: CHEST  2 VIEW  COMPARISON:  Two-view chest 10/21/2013.  FINDINGS: The heart is enlarged. Moderate bilateral pleural effusions have improved slightly. Bibasilar opacification is evident. Mild pulmonary vascular congestion has improved. The upper lung fields are clear.  IMPRESSION: 1. Improving moderate bilateral pleural effusions. 2. Bibasilar airspace disease likely reflects atelectasis. Infection is not excluded. 3. Improving bilateral pulmonary vascular congestion.   Electronically Signed   By: Gennette Pachris  Mattern M.D.   On: 10/24/2013 07:20   Nm Myocar Multi W/spect W/wall Motion / Ef  10/24/2013   CLINICAL DATA:  63 year old with chest pain.  EXAM: MYOCARDIAL IMAGING WITH SPECT (REST AND PHARMACOLOGIC-STRESS)  GATED LEFT VENTRICULAR WALL MOTION STUDY  LEFT VENTRICULAR  EJECTION FRACTION  TECHNIQUE: Standard myocardial SPECT imaging performed after resting intravenous injection of 10 mCi Tc-6010m sestimibi. Subsequently, intravenous infusion of regadenoson performed under the supervision of the Cardiology staff. At peak effect of the drug, 30 mCi Tc-910m sestimibi injected intravenously and standard myocardial SPECT imaging performed. Quantitative gated imaging also performed to evaluate left ventricular wall motion and estimate left ventricular ejection fraction.  FINDINGS: MYOCARDIAL IMAGING WITH SPECT (REST AND PHARMACOLOGIC-STRESS)  There is decreased uptake along the inferolateral wall in the mid and basal segments. There is gut uptake identified on the stress images. There is no evidence for reversibility or ischemia.  GATED LEFT VENTRICULAR WALL MOTION STUDY  Review of the gated images demonstrates marked hypokinesia in the inferior wall. There is mild hypokinesia at the apex and lateral wall. No significant wall motion abnormality in the anterior and septal walls.  LEFT VENTRICULAR EJECTION FRACTION  QGS ejection fraction measures 40% , with an end-diastolic volume of 140 ml and an end-systolic volume of 84 ml.  IMPRESSION: No evidence for pharmacological induced ischemia.  Fixed defect involving the inferolateral wall with associated wall motion abnormalities. Findings are suggestive for an area of infarct.  Calculated ejection fraction is 40%.   Electronically Signed  By: Richarda Overlie M.D.   On: 10/24/2013 12:21   BMET    Component Value Date/Time   NA 141 10/25/2013 0419   K 4.0 10/25/2013 0419   CL 102 10/25/2013 0419   CO2 25 10/25/2013 0419   GLUCOSE 99 10/25/2013 0419   BUN 47* 10/25/2013 0419   CREATININE 3.43* 10/25/2013 0419   CREATININE 3.25* 09/02/2013 1512   CALCIUM 9.4 10/25/2013 0419   GFRNONAA 13* 10/25/2013 0419   GFRAA 15* 10/25/2013 0419   CBC    Component Value Date/Time   WBC 4.1 10/23/2013 0509   WBC 4.2* 11/05/2012 1059   RBC 3.46* 10/23/2013 0509   RBC  3.52* 08/18/2013 1211   RBC 4.17 11/05/2012 1059   HGB 9.8* 10/23/2013 0509   HGB 11.7* 11/05/2012 1059   HCT 29.8* 10/23/2013 0509   HCT 37.0* 11/05/2012 1059   PLT 195 10/23/2013 0509   MCV 86.1 10/23/2013 0509   MCV 88.8 11/05/2012 1059   MCH 28.3 10/23/2013 0509   MCH 28.1 11/05/2012 1059   MCHC 32.9 10/23/2013 0509   MCHC 31.6* 11/05/2012 1059   RDW 14.2 10/23/2013 0509   LYMPHSABS 1.6 09/02/2013 1512   MONOABS 0.6 09/02/2013 1512   EOSABS 0.2 09/02/2013 1512   BASOSABS 0.0 09/02/2013 1512     Assessment: 1. CKD 4 most likely sec to DM/HTN.  Scr trending down 2. Vol overload 3. Anemia, received IV iron 4. HTN 5. DM  Plan: 1. She can be DC'd from my standpoint and my office will call her to arrange FU   Jaime Dome T

## 2013-10-25 NOTE — Progress Notes (Signed)
Patient discharged to home with husband. IV removed prior to discharge. IV site clean, dry, intact. Discharge instructions, education, and medications discussed with patient prior to discharge; patient voiced understanding of education.

## 2013-10-25 NOTE — Discharge Summary (Signed)
Physician Discharge Summary  [name] RKY:[id number] DOB: 1950/04/24 DOA: 10/21/2013  PCP: Lorayne Marek, MD  Admit date: 10/21/2013 Discharge date: 10/25/2013  Time spent: 35 minutes.    Recommendations for Outpatient Follow-up:  1. Needs to follow up with nephrologist for B-met, follow renal function. Patient will need vein map... Permanent access for future dialysis.  2. Follow with Dr Radford Pax for Heart failure.   Discharge Diagnoses:    Acute combined systolic and diastolic heart failure   CKD, Stage IV   DM2 (diabetes mellitus, type 2)   HTN (hypertension), malignant   RBBB   LVH (left ventricular hypertrophy)   Chronic kidney disease (CKD), stage IV (severe)   CHF (congestive heart failure)   Hypertensive nephropathy   Pleural effusion, bilateral   Discharge Condition: Stable.   Diet recommendation: Heart Healthy, renal diet.   Filed Weights   10/23/13 0500 10/24/13 0557 10/25/13 0541  Weight: 60.555 kg (133 lb 8 oz) 59.058 kg (130 lb 3.2 oz) 56.972 kg (125 lb 9.6 oz)    History of present illness:  [name] is a 63 y.o. female HTN, DM 2, CKD, HLD, Anemia, CHF presented with worsening SOB, DOE, associated with leg edema, and some PND, orthopea for several days; denies chest pain, no cough, no fever, no nausea, vomiting or diarrhea, no focal neuro symptoms  -admitted with CHF exacerbation    Hospital Course:  1-Acute on chronic combine systolic and diastolic Heart failure:  Patient present with dyspnea, Chest x ray with bilateral infiltrates, she ran out of lasix. She was started on lasix 60 mg IV TID. Her pulmonary edema is also related to her CKD. Nephrologist was consulted.  Weight: 62----61---60----59 . Urine out put 1.4 L.---2.9 L  Repeat Chest x ray 8-8 improving Bilateral pleural effusion.  She will be discharge on oral lasix 80 mg BID.  Stress test : No evidence for pharmacological induced ischemia. Findings are suggestive for an area of infarct.  Cardiology recommending Medical treatment. Cath was defer at this time due to her CKD.   2-CKD-IV; cr At 3.. Per records Cr range: 2.2 to 3.5.  Appreciate Dr Mercy Moore help.  Now on 80 mg PO lasix BID.  Renal function stable on current lasix dose.  Good urine out put.  Patient aware that she will probably need dialysis in the future.  She will follow with Dr Ihor Austin for further care. She will need Vein map.   HTN uncontrolled: Continue with coreg, hydralazine, Norvasc, isosorbide.  DM, last HA1c-7.6: Hold PO hypoglycemics with CKD: ISS for now. Will resume very low dose Amyril 1 mg daily.   LE edema likely due to CHF; doppler negative for DVT. VQ scan negative for PE.  Anemia, Iron deficiency, component of AoCD; Ferritin 71, iron 39. She will need outpatient screening colonoscopy. IV iron.  Transaminases; resolved. Suspect hepatic congestion.    Procedures:  Stress test:  No evidence for pharmacological induced ischemia. Findings are suggestive for an area of infarct. Cardiology recommending Medical treatment. Cath was defer at this time due to her CKD  Consultations:  Nephrologist  Cardiologist  Discharge Exam: Filed Vitals:   10/25/13 0928  BP: 145/76  Pulse: 68  Temp: 97.4 F (36.3 C)  Resp: 16    General: No distress.  Cardiovascular: S 1, S 2 RRR Respiratory: CTA  Discharge Instructions You were cared for by a hospitalist during your hospital stay. If you have any questions about your discharge medications or the care you  received while you were in the hospital after you are discharged, you can call the unit and asked to speak with the hospitalist on call if the hospitalist that took care of you is not available. Once you are discharged, your primary care physician will handle any further medical issues. Please note that NO REFILLS for any discharge medications will be authorized once you are discharged, as it is imperative that you return to your primary care  physician (or establish a relationship with a primary care physician if you do not have one) for your aftercare needs so that they can reassess your need for medications and monitor your lab values.  Discharge Instructions   Diet - low sodium heart healthy    Complete by:  As directed      Increase activity slowly    Complete by:  As directed             Medication List         amLODipine 10 MG tablet  Commonly known as:  NORVASC  Take 1 tablet (10 mg total) by mouth every morning.     aspirin EC 81 MG tablet  Take 81 mg by mouth every morning.     brimonidine-timolol 0.2-0.5 % ophthalmic solution  Commonly known as:  COMBIGAN  Place 1 drop into both eyes 3 (three) times daily.     carvedilol 12.5 MG tablet  Commonly known as:  COREG  Take 1 tablet (12.5 mg total) by mouth 2 (two) times daily with a meal.     ferrous sulfate 325 (65 FE) MG tablet  Take 1 tablet (325 mg total) by mouth 2 (two) times daily with a meal.     furosemide 80 MG tablet  Commonly known as:  LASIX  Take 1 tablet (80 mg total) by mouth 2 (two) times daily.     glimepiride 1 MG tablet  Commonly known as:  AMARYL  Take 1 tablet (1 mg total) by mouth daily before breakfast.     hydrALAZINE 25 MG tablet  Commonly known as:  APRESOLINE  Take 1 tablet (25 mg total) by mouth every 8 (eight) hours.     isosorbide mononitrate 15 mg Tb24 24 hr tablet  Commonly known as:  IMDUR  Take 0.5 tablets (15 mg total) by mouth daily.     pravastatin 40 MG tablet  Commonly known as:  PRAVACHOL  Take 1 tablet (40 mg total) by mouth daily.       Allergies  Allergen Reactions  . Nitrofurantoin Monohyd Macro Nausea Only       Follow-up Information   Follow up with Windy Kalata, MD.   Specialty:  Nephrology   Contact information:   [place] [address] [phone]       Follow up with Sueanne Margarita, MD In 1 week.   Specialty:  Cardiology   Contact information:   [address] [place] [address] [phone]        The results of significant diagnostics from this hospitalization (including imaging, microbiology, ancillary and laboratory) are listed below for reference.    Significant Diagnostic Studies: Dg Chest 2 View  10/24/2013   CLINICAL DATA:  Follow-up pleural effusion  EXAM: CHEST  2 VIEW  COMPARISON:  Two-view chest 10/21/2013.  FINDINGS: The heart is enlarged. Moderate bilateral pleural effusions have improved slightly. Bibasilar opacification is evident. Mild pulmonary vascular congestion has improved. The upper lung fields are clear.  IMPRESSION: 1.  Improving moderate bilateral pleural effusions. 2. Bibasilar airspace disease likely reflects atelectasis. Infection is not excluded. 3. Improving bilateral pulmonary vascular congestion.   Electronically Signed   By: Lawrence Santiago M.D.   On: 10/24/2013 07:20   Dg Chest 2 View  10/21/2013   CLINICAL DATA:  Short of breath  EXAM: CHEST  2 VIEW  COMPARISON:  08/16/2013  FINDINGS: Bilateral pleural effusions and bibasilar atelectasis are worse. Normal heart size. Vascular congestion. No pneumothorax.  IMPRESSION: Bilateral pleural effusions and bibasilar atelectasis have worsened.  Vascular congestion.   Electronically Signed   By: Maryclare Bean M.D.   On: 10/21/2013 10:24   Nm Myocar Multi W/spect W/wall Motion / Ef  10/24/2013   CLINICAL DATA:  63 year old with chest pain.  EXAM: MYOCARDIAL IMAGING WITH SPECT (REST AND PHARMACOLOGIC-STRESS)  GATED LEFT VENTRICULAR WALL MOTION STUDY  LEFT VENTRICULAR EJECTION FRACTION  TECHNIQUE: Standard myocardial SPECT imaging performed after resting intravenous injection of 10 mCi Tc-49msestimibi. Subsequently, intravenous infusion of regadenoson performed under the supervision of the Cardiology staff. At peak effect of the drug, 30 mCi Tc-968mestimibi injected intravenously and standard myocardial SPECT imaging performed. Quantitative gated imaging also  performed to evaluate left ventricular wall motion and estimate left ventricular ejection fraction.  FINDINGS: MYOCARDIAL IMAGING WITH SPECT (REST AND PHARMACOLOGIC-STRESS)  There is decreased uptake along the inferolateral wall in the mid and basal segments. There is gut uptake identified on the stress images. There is no evidence for reversibility or ischemia.  GATED LEFT VENTRICULAR WALL MOTION STUDY  Review of the gated images demonstrates marked hypokinesia in the inferior wall. There is mild hypokinesia at the apex and lateral wall. No significant wall motion abnormality in the anterior and septal walls.  LEFT VENTRICULAR EJECTION FRACTION  QGS ejection fraction measures 40% , with an end-diastolic volume of 14962l and an end-systolic volume of 84 ml.  IMPRESSION: No evidence for pharmacological induced ischemia.  Fixed defect involving the inferolateral wall with associated wall motion abnormalities. Findings are suggestive for an area of infarct.  Calculated ejection fraction is 40%.   Electronically Signed   By: AdMarkus Daft.D.   On: 10/24/2013 12:21   Nm Pulmonary Perf And Vent  10/21/2013   CLINICAL DATA:  Shortness of breath.  EXAM: NUCLEAR MEDICINE VENTILATION - PERFUSION LUNG SCAN  TECHNIQUE: Ventilation images were obtained in multiple projections using inhaled aerosol technetium 99 M DTPA. Perfusion images were obtained in multiple projections after intravenous injection of Tc-9989mA.  RADIOPHARMACEUTICALS:  40 mCi Tc-79m92mA aerosol and 6 mCi Tc-79m 33m COMPARISON:  Chest x-ray dated 10/21/2013  FINDINGS: Ventilation: The ventilation exam is suboptimal with the patient holding much of the agent in the oropharynx and also swallowing some of the agent.  Perfusion: No perfusion defects. Decreased ventilation and perfusion of both lung bases due to the pleural effusions.  IMPRESSION: No evidence of pulmonary embolism. Poor ventilation component of the exam. However, the perfusion study is  diagnostic.   Electronically Signed   By: Jim  Rozetta Nunnery   On: 10/21/2013 17:42    Microbiology: No results found for this or any previous visit (from the past 240 hour(s)).   Labs: Basic Metabolic Panel:  Recent Labs Lab 10/21/13 0938 10/21/13 1600 10/22/13 0239 10/23/13 0509 10/24/13 0347 10/25/13 0419  NA 141  --  139 143 142 141  K 4.6  --  4.5 3.8 4.1 4.0  CL 107  --  105 106 104 102  CO2 22  --  22 22 23 25   GLUCOSE 222*  --  147* 81 98 99  BUN 37*  --  45* 49* 50* 47*  CREATININE 2.96* 2.72* 3.26* 3.51* 3.88* 3.43*  CALCIUM 8.7  --  8.6 8.5 9.1 9.4  PHOS  --   --   --  4.3  --  4.3   Liver Function Tests:  Recent Labs Lab 10/21/13 0938 10/23/13 0509 10/24/13 0347 10/25/13 0419  AST 55*  --  14  --   ALT 82*  --  34  --   ALKPHOS 138*  --  106  --   BILITOT 0.2*  --  <0.2*  --   PROT 6.1  --  5.9*  --   ALBUMIN 3.0* 2.7* 2.8* 2.9*   No results found for this basename: LIPASE, AMYLASE,  in the last 168 hours No results found for this basename: AMMONIA,  in the last 168 hours CBC:  Recent Labs Lab 10/21/13 0938 10/21/13 1600 10/23/13 0509  WBC 4.2 4.6 4.1  HGB 10.3* 11.1* 9.8*  HCT 31.3* 34.1* 29.8*  MCV 85.5 85.3 86.1  PLT 205 220 195   Cardiac Enzymes:  Recent Labs Lab 10/21/13 0938 10/21/13 1600 10/21/13 2108 10/22/13 0239  TROPONINI <0.30 <0.30 <0.30 <0.30   BNP: BNP (last 3 results)  Recent Labs  08/16/13 1758 08/20/13 1020 10/21/13 0938  PROBNP 15783.0* 8385.0* 32323.0*   CBG:  Recent Labs Lab 10/24/13 0601 10/24/13 1122 10/24/13 1624 10/24/13 2101 10/25/13 0641  GLUCAP 107* 139* 132* 113* 114*       Signed:  Tegan Burnside A  [facility] 10/25/2013, 10:23 AM

## 2013-10-25 NOTE — Progress Notes (Signed)
Creatinine stabilized. BP improved somewhat with increase in hydralazine. Ok for discharge from a cardiac standpoint today. Follow-up with Dr. Mayford Knife after discharge in 5-7 days or midlevel provider if no appointments are available.  Chrystie Nose, MD, [facility] Attending Cardiologist [facility]

## 2013-10-26 LAB — PROTEIN ELECTROPHORESIS, SERUM
ALPHA-1-GLOBULIN: 6.3 % — AB (ref 2.9–4.9)
ALPHA-2-GLOBULIN: 14.8 % — AB (ref 7.1–11.8)
Albumin ELP: 51.6 % — ABNORMAL LOW (ref 55.8–66.1)
Beta 2: 6.6 % — ABNORMAL HIGH (ref 3.2–6.5)
Beta Globulin: 6.4 % (ref 4.7–7.2)
GAMMA GLOBULIN: 14.3 % (ref 11.1–18.8)
M-SPIKE, %: NOT DETECTED g/dL
TOTAL PROTEIN ELP: 5.5 g/dL — AB (ref 6.0–8.3)

## 2013-10-26 LAB — PARATHYROID HORMONE, INTACT (NO CA): PTH: 234.4 pg/mL — AB (ref 14.0–72.0)

## 2013-10-31 LAB — PTH-RELATED PEPTIDE: PTH-related peptide: 27 pg/mL (ref 14–27)

## 2013-12-04 ENCOUNTER — Ambulatory Visit: Payer: Self-pay | Admitting: Internal Medicine

## 2013-12-31 IMAGING — CR DG CHEST 1V PORT
1 series · 1 of 1 positions shown · non-contrast
Comparison: None.

CLINICAL DATA: Cough, dehydration, weakness

PORTABLE CHEST - 1 VIEW

[AP]
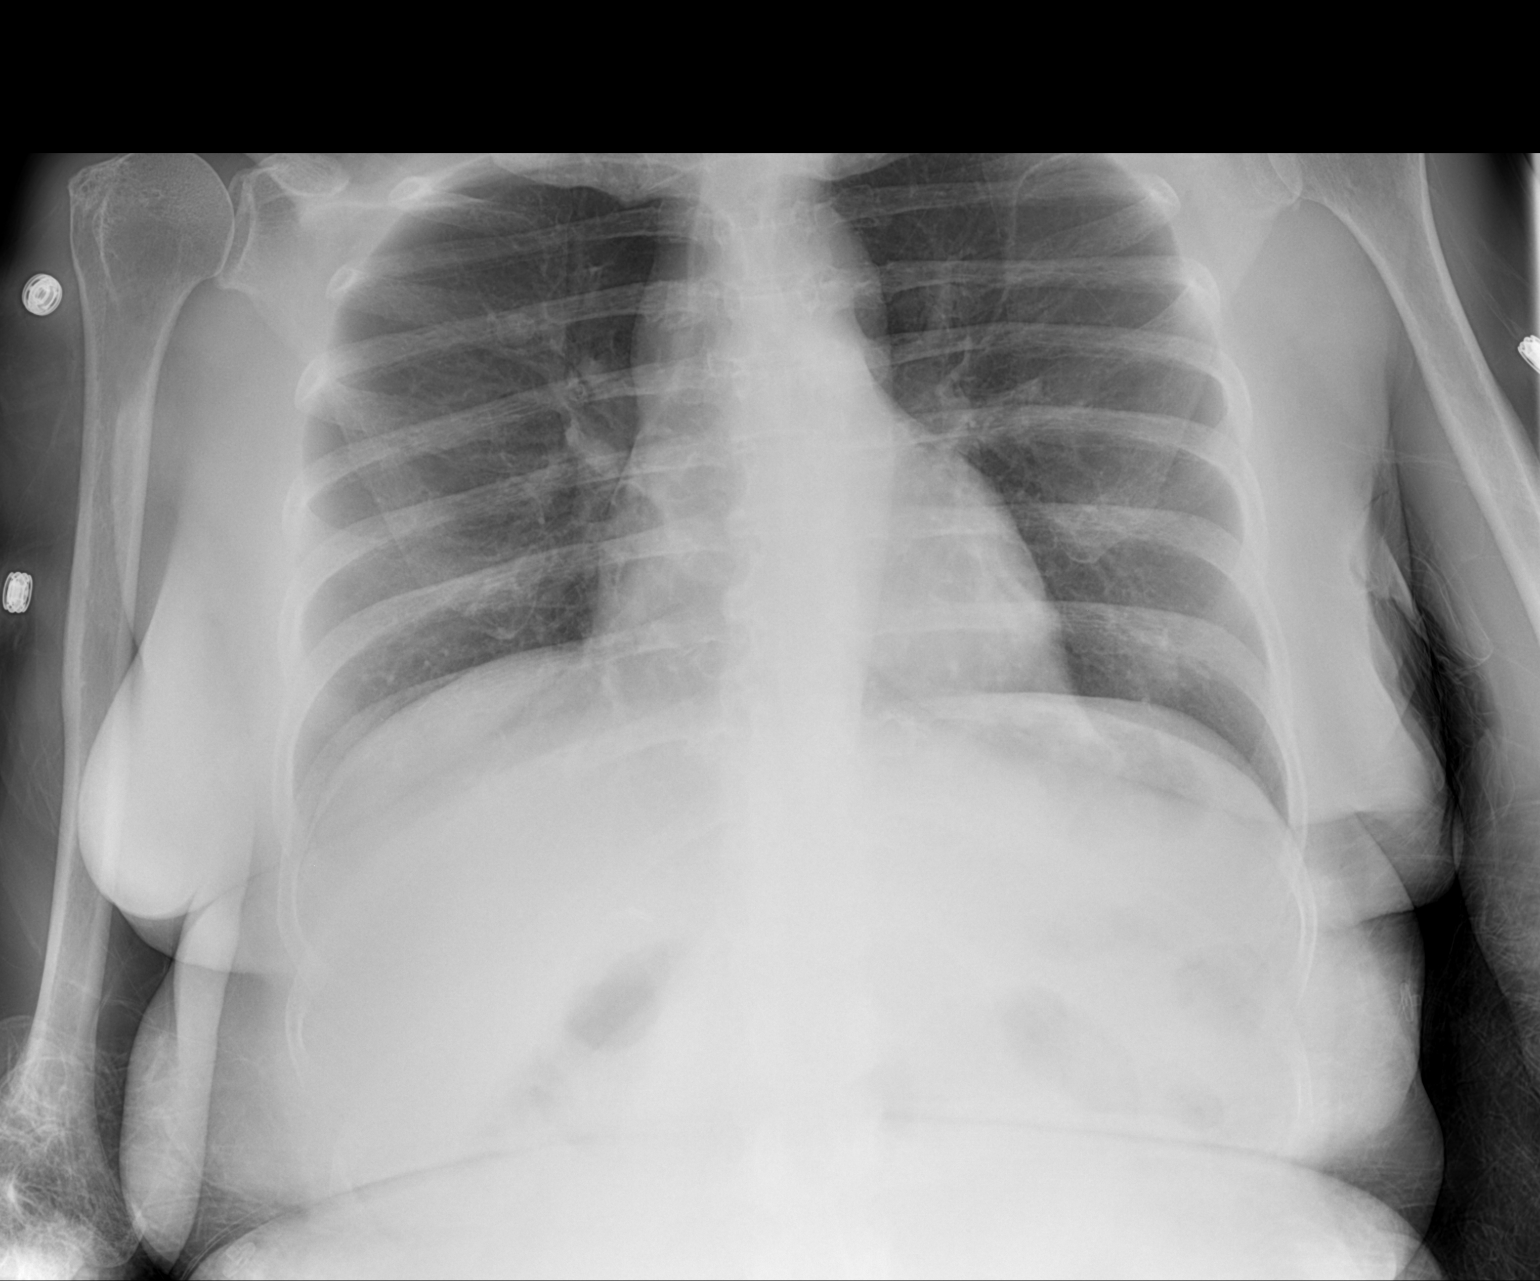

[1 of 1 positions shown; findings below may reference images not displayed]

FINDINGS: Shallow inspiration.  Heart size and pulmonary
vascularity are normal.  No focal airspace consolidation in the
lungs.  No blunting of costophrenic angles.  No pneumothorax.
Circumscribed ovoid density projected over the right upper lung
demonstrates a lucent halo consistent with a skin lesion.
Correlation with physical exam is recommended.  Degenerative
changes in the spine.
IMPRESSION: No evidence of active pulmonary disease.

## 2014-02-15 ENCOUNTER — Ambulatory Visit: Payer: Commercial Managed Care - HMO | Attending: Internal Medicine | Admitting: Internal Medicine

## 2014-02-15 ENCOUNTER — Encounter: Payer: Self-pay | Admitting: Internal Medicine

## 2014-02-15 VITALS — BP 158/90 | HR 73 | Temp 98.0°F | Resp 16 | Wt 129.8 lb

## 2014-02-15 DIAGNOSIS — I509 Heart failure, unspecified: Secondary | ICD-10-CM | POA: Diagnosis not present

## 2014-02-15 DIAGNOSIS — E785 Hyperlipidemia, unspecified: Secondary | ICD-10-CM

## 2014-02-15 DIAGNOSIS — E119 Type 2 diabetes mellitus without complications: Secondary | ICD-10-CM | POA: Diagnosis not present

## 2014-02-15 DIAGNOSIS — I517 Cardiomegaly: Secondary | ICD-10-CM | POA: Diagnosis not present

## 2014-02-15 DIAGNOSIS — N184 Chronic kidney disease, stage 4 (severe): Secondary | ICD-10-CM | POA: Diagnosis not present

## 2014-02-15 DIAGNOSIS — IMO0001 Reserved for inherently not codable concepts without codable children: Secondary | ICD-10-CM

## 2014-02-15 DIAGNOSIS — I10 Essential (primary) hypertension: Secondary | ICD-10-CM

## 2014-02-15 DIAGNOSIS — E139 Other specified diabetes mellitus without complications: Secondary | ICD-10-CM

## 2014-02-15 DIAGNOSIS — R030 Elevated blood-pressure reading, without diagnosis of hypertension: Secondary | ICD-10-CM

## 2014-02-15 LAB — COMPLETE METABOLIC PANEL WITH GFR
ALBUMIN: 3.1 g/dL — AB (ref 3.5–5.2)
ALT: 28 U/L (ref 0–35)
AST: 17 U/L (ref 0–37)
Alkaline Phosphatase: 117 U/L (ref 39–117)
BUN: 43 mg/dL — AB (ref 6–23)
CALCIUM: 9.1 mg/dL (ref 8.4–10.5)
CHLORIDE: 107 meq/L (ref 96–112)
CO2: 23 mEq/L (ref 19–32)
Creat: 3.66 mg/dL — ABNORMAL HIGH (ref 0.50–1.10)
GFR, Est African American: 14 mL/min — ABNORMAL LOW
GFR, Est Non African American: 12 mL/min — ABNORMAL LOW
Glucose, Bld: 140 mg/dL — ABNORMAL HIGH (ref 70–99)
POTASSIUM: 4.7 meq/L (ref 3.5–5.3)
SODIUM: 139 meq/L (ref 135–145)
TOTAL PROTEIN: 5.6 g/dL — AB (ref 6.0–8.3)
Total Bilirubin: 0.3 mg/dL (ref 0.2–1.2)

## 2014-02-15 LAB — POCT GLYCOSYLATED HEMOGLOBIN (HGB A1C): HEMOGLOBIN A1C: 6.6

## 2014-02-15 LAB — GLUCOSE, POCT (MANUAL RESULT ENTRY): POC Glucose: 129 mg/dl — AB (ref 70–99)

## 2014-02-15 MED ORDER — HYDRALAZINE HCL 25 MG PO TABS: 25.0000 mg | ORAL_TABLET | Freq: Three times a day (TID) | ORAL | Status: DC

## 2014-02-15 MED ORDER — FUROSEMIDE 80 MG PO TABS: 80.0000 mg | ORAL_TABLET | Freq: Two times a day (BID) | ORAL | Status: DC

## 2014-02-15 MED ORDER — ISOSORBIDE MONONITRATE 15 MG HALF TABLET: 15.0000 mg | ORAL_TABLET | Freq: Every day | ORAL | Status: DC

## 2014-02-15 MED ORDER — AMLODIPINE BESYLATE 10 MG PO TABS: 10.0000 mg | ORAL_TABLET | Freq: Every morning | ORAL | Status: DC

## 2014-02-15 MED ORDER — CLONIDINE HCL 0.1 MG PO TABS
0.1000 mg | ORAL_TABLET | Freq: Once | ORAL | Status: AC
Start: 2014-02-15 — End: 2014-02-15
  Administered 2014-02-15: 0.1 mg via ORAL

## 2014-02-15 MED ORDER — PRAVASTATIN SODIUM 40 MG PO TABS: 40.0000 mg | ORAL_TABLET | Freq: Every day | ORAL | Status: AC

## 2014-02-15 MED ORDER — CARVEDILOL 12.5 MG PO TABS: 12.5000 mg | ORAL_TABLET | Freq: Two times a day (BID) | ORAL | Status: DC

## 2014-02-15 NOTE — Patient Instructions (Signed)
Diabetes Mellitus and Food It is important for you to manage your blood sugar (glucose) level. Your blood glucose level can be greatly affected by what you eat. Eating healthier foods in the appropriate amounts throughout the day at about the same time each day will help you control your blood glucose level. It can also help slow or prevent worsening of your diabetes mellitus. Healthy eating may even help you improve the level of your blood pressure and reach or maintain a healthy weight.  HOW CAN FOOD AFFECT ME? Carbohydrates Carbohydrates affect your blood glucose level more than any other type of food. Your dietitian will help you determine how many carbohydrates to eat at each meal and teach you how to count carbohydrates. Counting carbohydrates is important to keep your blood glucose at a healthy level, especially if you are using insulin or taking certain medicines for diabetes mellitus. Alcohol Alcohol can cause sudden decreases in blood glucose (hypoglycemia), especially if you use insulin or take certain medicines for diabetes mellitus. Hypoglycemia can be a life-threatening condition. Symptoms of hypoglycemia (sleepiness, dizziness, and disorientation) are similar to symptoms of having too much alcohol.  If your health care provider has given you approval to drink alcohol, do so in moderation and use the following guidelines:  Women should not have more than one drink per day, and men should not have more than two drinks per day. One drink is equal to:  12 oz of beer.  5 oz of wine.  1 oz of hard liquor.  Do not drink on an empty stomach.  Keep yourself hydrated. Have water, diet soda, or unsweetened iced tea.  Regular soda, juice, and other mixers might contain a lot of carbohydrates and should be counted. WHAT FOODS ARE NOT RECOMMENDED? As you make food choices, it is important to remember that all foods are not the same. Some foods have fewer nutrients per serving than other  foods, even though they might have the same number of calories or carbohydrates. It is difficult to get your body what it needs when you eat foods with fewer nutrients. Examples of foods that you should avoid that are high in calories and carbohydrates but low in nutrients include:  Trans fats (most processed foods list trans fats on the Nutrition Facts label).  Regular soda.  Juice.  Candy.  Sweets, such as cake, pie, doughnuts, and cookies.  Fried foods. WHAT FOODS CAN I EAT? Have nutrient-rich foods, which will nourish your body and keep you healthy. The food you should eat also will depend on several factors, including:  The calories you need.  The medicines you take.  Your weight.  Your blood glucose level.  Your blood pressure level.  Your cholesterol level. You also should eat a variety of foods, including:  Protein, such as meat, poultry, fish, tofu, nuts, and seeds (lean animal proteins are best).  Fruits.  Vegetables.  Dairy products, such as milk, cheese, and yogurt (low fat is best).  Breads, grains, pasta, cereal, rice, and beans.  Fats such as olive oil, trans fat-free margarine, canola oil, avocado, and olives. DOES EVERYONE WITH DIABETES MELLITUS HAVE THE SAME MEAL PLAN? Because every person with diabetes mellitus is different, there is not one meal plan that works for everyone. It is very important that you meet with a dietitian who will help you create a meal plan that is just right for you. Document Released: 11/30/2004 Document Revised: 03/10/2013 Document Reviewed: 01/30/2013 ExitCare Patient Information 2015 [employer]. This   information is not intended to replace advice given to you by your health care provider. Make sure you discuss any questions you have with your health care provider. DASH Eating Plan DASH stands for "Dietary Approaches to Stop Hypertension." The DASH eating plan is a healthy eating plan that has been shown to reduce high  blood pressure (hypertension). Additional health benefits may include reducing the risk of type 2 diabetes mellitus, heart disease, and stroke. The DASH eating plan may also help with weight loss. WHAT DO I NEED TO KNOW ABOUT THE DASH EATING PLAN? For the DASH eating plan, you will follow these general guidelines:  Choose foods with a percent daily value for sodium of less than 5% (as listed on the food label).  Use salt-free seasonings or herbs instead of table salt or sea salt.  Check with your health care provider or pharmacist before using salt substitutes.  Eat lower-sodium products, often labeled as "lower sodium" or "no salt added."  Eat fresh foods.  Eat more vegetables, fruits, and low-fat dairy products.  Choose whole grains. Look for the word "whole" as the first word in the ingredient list.  Choose fish and skinless chicken or turkey more often than red meat. Limit fish, poultry, and meat to 6 oz (170 g) each day.  Limit sweets, desserts, sugars, and sugary drinks.  Choose heart-healthy fats.  Limit cheese to 1 oz (28 g) per day.  Eat more home-cooked food and less restaurant, buffet, and fast food.  Limit fried foods.  Cook foods using methods other than frying.  Limit canned vegetables. If you do use them, rinse them well to decrease the sodium.  When eating at a restaurant, ask that your food be prepared with less salt, or no salt if possible. WHAT FOODS CAN I EAT? Seek help from a dietitian for individual calorie needs. Grains Whole grain or whole wheat bread. Brown rice. Whole grain or whole wheat pasta. Quinoa, bulgur, and whole grain cereals. Low-sodium cereals. Corn or whole wheat flour tortillas. Whole grain cornbread. Whole grain crackers. Low-sodium crackers. Vegetables Fresh or frozen vegetables (raw, steamed, roasted, or grilled). Low-sodium or reduced-sodium tomato and vegetable juices. Low-sodium or reduced-sodium tomato sauce and paste. Low-sodium  or reduced-sodium canned vegetables.  Fruits All fresh, canned (in natural juice), or frozen fruits. Meat and Other Protein Products Ground beef (85% or leaner), grass-fed beef, or beef trimmed of fat. Skinless chicken or turkey. Ground chicken or turkey. Pork trimmed of fat. All fish and seafood. Eggs. Dried beans, peas, or lentils. Unsalted nuts and seeds. Unsalted canned beans. Dairy Low-fat dairy products, such as skim or 1% milk, 2% or reduced-fat cheeses, low-fat ricotta or cottage cheese, or plain low-fat yogurt. Low-sodium or reduced-sodium cheeses. Fats and Oils Tub margarines without trans fats. Light or reduced-fat mayonnaise and salad dressings (reduced sodium). Avocado. Safflower, olive, or canola oils. Natural peanut or almond butter. Other Unsalted popcorn and pretzels. The items listed above may not be a complete list of recommended foods or beverages. Contact your dietitian for more options. WHAT FOODS ARE NOT RECOMMENDED? Grains White bread. White pasta. White rice. Refined cornbread. Bagels and croissants. Crackers that contain trans fat. Vegetables Creamed or fried vegetables. Vegetables in a cheese sauce. Regular canned vegetables. Regular canned tomato sauce and paste. Regular tomato and vegetable juices. Fruits Dried fruits. Canned fruit in light or heavy syrup. Fruit juice. Meat and Other Protein Products Fatty cuts of meat. Ribs, chicken wings, bacon, sausage, bologna, salami, chitterlings, fatback, hot   dogs, bratwurst, and packaged luncheon meats. Salted nuts and seeds. Canned beans with salt. Dairy Whole or 2% milk, cream, half-and-half, and cream cheese. Whole-fat or sweetened yogurt. Full-fat cheeses or blue cheese. Nondairy creamers and whipped toppings. Processed cheese, cheese spreads, or cheese curds. Condiments Onion and garlic salt, seasoned salt, table salt, and sea salt. Canned and packaged gravies. Worcestershire sauce. Tartar sauce. Barbecue sauce.  Teriyaki sauce. Soy sauce, including reduced sodium. Steak sauce. Fish sauce. Oyster sauce. Cocktail sauce. Horseradish. Ketchup and mustard. Meat flavorings and tenderizers. Bouillon cubes. Hot sauce. Tabasco sauce. Marinades. Taco seasonings. Relishes. Fats and Oils Butter, stick margarine, lard, shortening, ghee, and bacon fat. Coconut, palm kernel, or palm oils. Regular salad dressings. Other Pickles and olives. Salted popcorn and pretzels. The items listed above may not be a complete list of foods and beverages to avoid. Contact your dietitian for more information. WHERE CAN I FIND MORE INFORMATION? National Heart, Lung, and [employer] Institute: www.nhlbi.nih.gov/health/health-topics/topics/dash/ Document Released: 02/22/2011 Document Revised: 07/20/2013 Document Reviewed: 01/07/2013 ExitCare Patient Information 2015 [employer]. This information is not intended to replace advice given to you by your health care provider. Make sure you discuss any questions you have with your health care provider.  

## 2014-02-15 NOTE — Progress Notes (Signed)
MRN: [id number] Name: [name]  Sex: female Age: 63 y.o. DOB: 1950/09/27  Allergies: Nitrofurantoin monohyd macro  Chief Complaint  Patient presents with  . Follow-up    HPI: Patient is 63 y.o. female who has history of CHF, hypertension, diabetes, CK D. Stage IV, in August she was hospitalized with acute as CHF exacerbation, EMR reviewed her medications were optimized and was advised to follow with cardiology as well as nephrology as per patient she has not made an appointment , she ran out of her medications and is requesting refill, she has been taking her diabetes medication which includes Amaryl her hemoglobin A1c and has improved currently at 6.6%, she denies any chest pain shortness of breath any leg swelling any orthopnea or PND. Today her blood pressure is elevated since she has not taken her blood pressure medications.  Past Medical History  Diagnosis Date  . Hypertension   . Diabetes mellitus   . Anemia   . Hyperlipidemia   . Left ventricular hypertrophy     Past Surgical History  Procedure Laterality Date  . Cholecystectomy        Medication List       This list is accurate as of: 02/15/14 11:06 AM.  Always use your most recent med list.               amLODipine 10 MG tablet  Commonly known as:  NORVASC  Take 1 tablet (10 mg total) by mouth every morning.     aspirin EC 81 MG tablet  Take 81 mg by mouth every morning.     brimonidine-timolol 0.2-0.5 % ophthalmic solution  Commonly known as:  COMBIGAN  Place 1 drop into both eyes 3 (three) times daily.     carvedilol 12.5 MG tablet  Commonly known as:  COREG  Take 1 tablet (12.5 mg total) by mouth 2 (two) times daily with a meal.     ferrous sulfate 325 (65 FE) MG tablet  Take 1 tablet (325 mg total) by mouth 2 (two) times daily with a meal.     furosemide 80 MG tablet  Commonly known as:  LASIX  Take 1 tablet (80 mg total) by mouth 2 (two) times daily.     glimepiride 1 MG tablet    Commonly known as:  AMARYL  Take 1 tablet (1 mg total) by mouth daily before breakfast.     hydrALAZINE 25 MG tablet  Commonly known as:  APRESOLINE  Take 1 tablet (25 mg total) by mouth every 8 (eight) hours.     isosorbide mononitrate 15 mg Tb24 24 hr tablet  Commonly known as:  IMDUR  Take 0.5 tablets (15 mg total) by mouth daily.     pravastatin 40 MG tablet  Commonly known as:  PRAVACHOL  Take 1 tablet (40 mg total) by mouth daily.        Meds ordered this encounter  Medications  . cloNIDine (CATAPRES) tablet 0.1 mg    Sig:   . hydrALAZINE (APRESOLINE) 25 MG tablet    Sig: Take 1 tablet (25 mg total) by mouth every 8 (eight) hours.    Dispense:  90 tablet    Refill:  3  . isosorbide mononitrate (IMDUR) 15 mg TB24 24 hr tablet    Sig: Take 0.5 tablets (15 mg total) by mouth daily.    Dispense:  30 tablet    Refill:  3  . furosemide (LASIX) 80 MG tablet    Sig:  Take 1 tablet (80 mg total) by mouth 2 (two) times daily.    Dispense:  90 tablet    Refill:  3  . carvedilol (COREG) 12.5 MG tablet    Sig: Take 1 tablet (12.5 mg total) by mouth 2 (two) times daily with a meal.    Dispense:  60 tablet    Refill:  3  . amLODipine (NORVASC) 10 MG tablet    Sig: Take 1 tablet (10 mg total) by mouth every morning.    Dispense:  30 tablet    Refill:  3  . pravastatin (PRAVACHOL) 40 MG tablet    Sig: Take 1 tablet (40 mg total) by mouth daily.    Dispense:  30 tablet    Refill:  7    Immunization History  Administered Date(s) Administered  . Pneumococcal Polysaccharide-23 08/18/2013    Family History  Problem Relation Age of Onset  . Diabetes type II    . Hypertension      History  Substance Use Topics  . Smoking status: Former Smoker    Types: Cigarettes    Quit date: 03/19/1966  . Smokeless tobacco: Never Used  . Alcohol Use: No    Review of Systems   As noted in HPI  Filed Vitals:   02/15/14 1039  BP: 158/90  Pulse:   Temp:   Resp:      Physical Exam  Physical Exam  Constitutional: She is oriented to person, place, and time. No distress.  Eyes: EOM are normal. Pupils are equal, round, and reactive to light.  Neck: Neck supple.  Cardiovascular: Normal rate and regular rhythm.   Pulmonary/Chest: Breath sounds normal. No respiratory distress. She has no wheezes. She has no rales.  Musculoskeletal: She exhibits no edema.  Neurological: She is alert and oriented to person, place, and time.    CBC    Component Value Date/Time   WBC 4.1 10/23/2013 0509   WBC 4.2* 11/05/2012 1059   RBC 3.46* 10/23/2013 0509   RBC 3.52* 08/18/2013 1211   RBC 4.17 11/05/2012 1059   HGB 9.8* 10/23/2013 0509   HGB 11.7* 11/05/2012 1059   HCT 29.8* 10/23/2013 0509   HCT 37.0* 11/05/2012 1059   PLT 195 10/23/2013 0509   MCV 86.1 10/23/2013 0509   MCV 88.8 11/05/2012 1059   LYMPHSABS 1.6 09/02/2013 1512   MONOABS 0.6 09/02/2013 1512   EOSABS 0.2 09/02/2013 1512   BASOSABS 0.0 09/02/2013 1512    CMP     Component Value Date/Time   NA 141 10/25/2013 0419   K 4.0 10/25/2013 0419   CL 102 10/25/2013 0419   CO2 25 10/25/2013 0419   GLUCOSE 99 10/25/2013 0419   BUN 47* 10/25/2013 0419   CREATININE 3.43* 10/25/2013 0419   CREATININE 3.25* 09/02/2013 1512   CALCIUM 9.4 10/25/2013 0419   PROT 5.9* 10/24/2013 0347   ALBUMIN 2.9* 10/25/2013 0419   AST 14 10/24/2013 0347   ALT 34 10/24/2013 0347   ALKPHOS 106 10/24/2013 0347   BILITOT <0.2* 10/24/2013 0347   GFRNONAA 13* 10/25/2013 0419   GFRAA 15* 10/25/2013 0419    Lab Results  Component Value Date/Time   CHOL 215* 08/17/2013 01:36 AM    No components found for: HGA1C  Lab Results  Component Value Date/Time   AST 14 10/24/2013 03:47 AM    Assessment and Plan  Other specified diabetes mellitus without complications - Plan:  Results for orders placed or performed in visit on 02/15/14  Glucose (  CBG)  Result Value Ref Range   POC Glucose 129 (A) 70 - 99 mg/dl  HgB  [id number]  Result Value Ref Range   Hemoglobin A1C 6.6    Diabetes is well controlled, continue with diabetes meal planning and Amaryl. Will repeat A1c in 3 months.  Elevated blood pressure - Plan: cloNIDine (CATAPRES) tablet 0.1 mg, repeat manual blood pressure is 158/90, advised patient for DASH diet compliance with medications.  LVH (left ventricular hypertrophy) - Plan: Ambulatory referral to Cardiology  Chronic kidney disease (CKD), stage IV (severe) - Plan: Ambulatory referral to Nephrology, COMPLETE METABOLIC PANEL WITH GFR  Congestive heart failure, unspecified congestive heart failure chronicity, unspecified congestive heart failure type - Plan: furosemide (LASIX) 80 MG tablet, carvedilol (COREG) 12.5 MG tablet, pravastatin (PRAVACHOL) 40 MG tablet, Ambulatory referral to Cardiology  Hyperlipidemia - Plan: pravastatin (PRAVACHOL) 40 MG tablet  Essential hypertension - Plan: hydrALAZINE (APRESOLINE) 25 MG tablet   Health Maintenance  -Vaccinations:  uptodate with pneumovax,  Declines flu shot   Return in about 3 months (around 05/17/2014) for hypertension, diabetes, BP check in 2 weeks/Nurse Visit.  Doris CheadleADVANI, Jeffry Vogelsang, MD

## 2014-02-15 NOTE — Progress Notes (Signed)
Patient here for follow up on her DM Patient has not taken her blood pressure medication Has been out of it since yesterday

## 2014-02-16 ENCOUNTER — Telehealth: Payer: Self-pay | Admitting: Internal Medicine

## 2014-02-16 NOTE — Telephone Encounter (Signed)
Pt is saying that when she went to [facility] to pick up her medication for her diabetes they didn't have it. [facility] said they didn't have the prescription on file. Pt did not know the exact name of the medication, she will return home in about 30 minutes to find out. Please follow up with pt.

## 2014-02-16 NOTE — Telephone Encounter (Signed)
Pt called back with name of medication that she needs script for: glimepiride (AMARYL) 1 MG tablet.  Pt uses Psych[facility], forensic on file.

## 2014-02-18 ENCOUNTER — Telehealth: Payer: Self-pay

## 2014-02-18 DIAGNOSIS — E139 Other specified diabetes mellitus without complications: Secondary | ICD-10-CM

## 2014-02-18 MED ORDER — GLIMEPIRIDE 1 MG PO TABS: 1.0000 mg | ORAL_TABLET | Freq: Every day | ORAL | Status: DC

## 2014-02-18 NOTE — Telephone Encounter (Signed)
Pt calling to follow up on refill for glimepiride. Please f/u with pt.

## 2014-02-18 NOTE — Telephone Encounter (Signed)
Patient calling requesting a refill on her amaryl Prescription sent to [facility] on [address]

## 2014-02-18 NOTE — Telephone Encounter (Signed)
[facility] called to clarify imdur order Should be 30mg  take half tablet  (15mg ) daily Clarified by Dr Orpah Cobb

## 2014-03-01 ENCOUNTER — Ambulatory Visit: Payer: Commercial Managed Care - HMO | Attending: Internal Medicine | Admitting: *Deleted

## 2014-03-01 VITALS — BP 158/80 | HR 74 | Temp 98.1°F | Resp 16 | Wt 121.0 lb

## 2014-03-01 DIAGNOSIS — IMO0001 Reserved for inherently not codable concepts without codable children: Secondary | ICD-10-CM

## 2014-03-01 DIAGNOSIS — I10 Essential (primary) hypertension: Secondary | ICD-10-CM | POA: Insufficient documentation

## 2014-03-01 DIAGNOSIS — R030 Elevated blood-pressure reading, without diagnosis of hypertension: Secondary | ICD-10-CM

## 2014-03-01 DIAGNOSIS — E785 Hyperlipidemia, unspecified: Secondary | ICD-10-CM | POA: Insufficient documentation

## 2014-03-01 NOTE — Progress Notes (Signed)
Patient presents for BP check Med list reviewed; states taking all meds as directed, however, has taken only two of her AM blood pressure meds this morning due to work schedule. Will be leaving here and going home to take rest of morning meds Declined flu vaccine  BP 158/80 P 74 R 16   T  98.1oral SPO2  100%  Patient advised to call for med refills at least 7 days before running out so as not to go without. Patient aware that she is to f/u with PCP 3 months from last visit (Due 05/16/14)

## 2014-03-17 ENCOUNTER — Institutional Professional Consult (permissible substitution): Payer: Self-pay | Admitting: Cardiovascular Disease

## 2014-04-23 ENCOUNTER — Telehealth: Payer: Self-pay | Admitting: Internal Medicine

## 2014-04-23 NOTE — Telephone Encounter (Signed)
Patient is requesting a refill for two medications one for bp and the other for fluids.

## 2014-04-29 ENCOUNTER — Telehealth: Payer: Self-pay

## 2014-04-29 NOTE — Telephone Encounter (Signed)
Patient called requesting referral for insurance to see Dr. Alan Mulder 05-04-14. Referral was submitted to [employer] and approved. [name] #[id number]. Start date: 05-03-14. Exp date: 10-30-14. 6 visits approved. Left message for patient to return my call.

## 2014-04-30 ENCOUNTER — Encounter: Payer: Self-pay | Admitting: Internal Medicine

## 2014-04-30 ENCOUNTER — Ambulatory Visit: Payer: Medicare HMO | Attending: Internal Medicine | Admitting: Internal Medicine

## 2014-04-30 VITALS — BP 152/88 | HR 78 | Temp 98.0°F | Resp 16 | Wt 125.0 lb

## 2014-04-30 DIAGNOSIS — E119 Type 2 diabetes mellitus without complications: Secondary | ICD-10-CM | POA: Insufficient documentation

## 2014-04-30 DIAGNOSIS — N289 Disorder of kidney and ureter, unspecified: Secondary | ICD-10-CM | POA: Diagnosis not present

## 2014-04-30 DIAGNOSIS — Z87891 Personal history of nicotine dependence: Secondary | ICD-10-CM | POA: Diagnosis not present

## 2014-04-30 DIAGNOSIS — I509 Heart failure, unspecified: Secondary | ICD-10-CM | POA: Diagnosis not present

## 2014-04-30 DIAGNOSIS — Z79899 Other long term (current) drug therapy: Secondary | ICD-10-CM | POA: Diagnosis not present

## 2014-04-30 DIAGNOSIS — I10 Essential (primary) hypertension: Secondary | ICD-10-CM | POA: Diagnosis not present

## 2014-04-30 DIAGNOSIS — E785 Hyperlipidemia, unspecified: Secondary | ICD-10-CM | POA: Diagnosis not present

## 2014-04-30 DIAGNOSIS — E139 Other specified diabetes mellitus without complications: Secondary | ICD-10-CM

## 2014-04-30 LAB — POCT GLYCOSYLATED HEMOGLOBIN (HGB A1C): HEMOGLOBIN A1C: 6

## 2014-04-30 MED ORDER — HYDRALAZINE HCL 25 MG PO TABS: 25.0000 mg | ORAL_TABLET | Freq: Three times a day (TID) | ORAL | Status: DC

## 2014-04-30 MED ORDER — FUROSEMIDE 80 MG PO TABS
80.0000 mg | ORAL_TABLET | Freq: Two times a day (BID) | ORAL | Status: DC
Start: 2014-04-30 — End: 2014-10-18

## 2014-04-30 MED ORDER — FERROUS SULFATE 325 (65 FE) MG PO TABS: 325.0000 mg | ORAL_TABLET | Freq: Two times a day (BID) | ORAL | Status: AC

## 2014-04-30 MED ORDER — GLIMEPIRIDE 1 MG PO TABS: 1.0000 mg | ORAL_TABLET | Freq: Every day | ORAL | Status: DC

## 2014-04-30 MED ORDER — FREESTYLE SYSTEM KIT: 1.0000 | PACK | Freq: Three times a day (TID) | Status: AC

## 2014-04-30 MED ORDER — FREESTYLE SYSTEM KIT: 1.0000 | PACK | Freq: Three times a day (TID) | Status: DC

## 2014-04-30 NOTE — Progress Notes (Signed)
MRN: [id number] Name: [name]  Sex: female Age: 64 y.o. DOB: 12-Mar-1951  Allergies: Nitrofurantoin monohyd macro  Chief Complaint  Patient presents with  . Follow-up    HPI: Patient is 64 y.o. female who history of hypertension, diabetes, CHF, chronic renal insufficiency, patient comes today for followup, she denies any hypoglycemic symptoms, her hemoglobin A1c is 6.0%, she's requesting refill on her medications, she is also scheduled to follow with her cardiologist as well as nephrologist. Patient is requesting referral to see ophthalmology.  Past Medical History  Diagnosis Date  . Hypertension   . Diabetes mellitus   . Anemia   . Hyperlipidemia   . Left ventricular hypertrophy     Past Surgical History  Procedure Laterality Date  . Cholecystectomy        Medication List       This list is accurate as of: 04/30/14 10:25 AM.  Always use your most recent med list.               amLODipine 10 MG tablet  Commonly known as:  NORVASC  Take 1 tablet (10 mg total) by mouth every morning.     aspirin EC 81 MG tablet  Take 81 mg by mouth every morning.     brimonidine-timolol 0.2-0.5 % ophthalmic solution  Commonly known as:  COMBIGAN  Place 1 drop into both eyes 3 (three) times daily.     carvedilol 12.5 MG tablet  Commonly known as:  COREG  Take 1 tablet (12.5 mg total) by mouth 2 (two) times daily with a meal.     ferrous sulfate 325 (65 FE) MG tablet  Take 1 tablet (325 mg total) by mouth 2 (two) times daily with a meal.     furosemide 80 MG tablet  Commonly known as:  LASIX  Take 1 tablet (80 mg total) by mouth 2 (two) times daily.     glimepiride 1 MG tablet  Commonly known as:  AMARYL  Take 1 tablet (1 mg total) by mouth daily before breakfast.     glucose monitoring kit monitoring kit  1 each by Does not apply route 4 (four) times daily - after meals and at bedtime. 1 month Diabetic Testing Supplies for QAC-QHS accuchecks.     hydrALAZINE  25 MG tablet  Commonly known as:  APRESOLINE  Take 1 tablet (25 mg total) by mouth every 8 (eight) hours.     isosorbide mononitrate 15 mg Tb24 24 hr tablet  Commonly known as:  IMDUR  Take 0.5 tablets (15 mg total) by mouth daily.     pravastatin 40 MG tablet  Commonly known as:  PRAVACHOL  Take 1 tablet (40 mg total) by mouth daily.        Meds ordered this encounter  Medications  . hydrALAZINE (APRESOLINE) 25 MG tablet    Sig: Take 1 tablet (25 mg total) by mouth every 8 (eight) hours.    Dispense:  90 tablet    Refill:  3  . glimepiride (AMARYL) 1 MG tablet    Sig: Take 1 tablet (1 mg total) by mouth daily before breakfast.    Dispense:  30 tablet    Refill:  0  . furosemide (LASIX) 80 MG tablet    Sig: Take 1 tablet (80 mg total) by mouth 2 (two) times daily.    Dispense:  90 tablet    Refill:  3  . ferrous sulfate 325 (65 FE) MG tablet  Sig: Take 1 tablet (325 mg total) by mouth 2 (two) times daily with a meal.    Dispense:  60 tablet    Refill:  3  . glucose monitoring kit (FREESTYLE) monitoring kit    Sig: 1 each by Does not apply route 4 (four) times daily - after meals and at bedtime. 1 month Diabetic Testing Supplies for QAC-QHS accuchecks.    Dispense:  1 each    Refill:  1    Immunization History  Administered Date(s) Administered  . Pneumococcal Polysaccharide-23 08/18/2013    Family History  Problem Relation Age of Onset  . Diabetes type II    . Hypertension    . Diabetes Mother     History  Substance Use Topics  . Smoking status: Former Smoker    Types: Cigarettes    Quit date: 03/19/1966  . Smokeless tobacco: Never Used  . Alcohol Use: No    Review of Systems   As noted in HPI  Filed Vitals:   04/30/14 0946  BP: 152/88  Pulse: 78  Temp: 98 F (36.7 C)  Resp: 16    Physical Exam  Physical Exam  Constitutional: No distress.  Cardiovascular: Normal rate and regular rhythm.   Pulmonary/Chest: Breath sounds normal. No  respiratory distress. She has no wheezes. She has no rales.  Musculoskeletal: She exhibits no edema.    CBC    Component Value Date/Time   WBC 4.1 10/23/2013 0509   WBC 4.2* 11/05/2012 1059   RBC 3.46* 10/23/2013 0509   RBC 3.52* 08/18/2013 1211   RBC 4.17 11/05/2012 1059   HGB 9.8* 10/23/2013 0509   HGB 11.7* 11/05/2012 1059   HCT 29.8* 10/23/2013 0509   HCT 37.0* 11/05/2012 1059   PLT 195 10/23/2013 0509   MCV 86.1 10/23/2013 0509   MCV 88.8 11/05/2012 1059   LYMPHSABS 1.6 09/02/2013 1512   MONOABS 0.6 09/02/2013 1512   EOSABS 0.2 09/02/2013 1512   BASOSABS 0.0 09/02/2013 1512    CMP     Component Value Date/Time   NA 139 02/15/2014 1040   K 4.7 02/15/2014 1040   CL 107 02/15/2014 1040   CO2 23 02/15/2014 1040   GLUCOSE 140* 02/15/2014 1040   BUN 43* 02/15/2014 1040   CREATININE 3.66* 02/15/2014 1040   CREATININE 3.43* 10/25/2013 0419   CALCIUM 9.1 02/15/2014 1040   PROT 5.6* 02/15/2014 1040   ALBUMIN 3.1* 02/15/2014 1040   AST 17 02/15/2014 1040   ALT 28 02/15/2014 1040   ALKPHOS 117 02/15/2014 1040   BILITOT 0.3 02/15/2014 1040   GFRNONAA 12* 02/15/2014 1040   GFRNONAA 13* 10/25/2013 0419   GFRAA 14* 02/15/2014 1040   GFRAA 15* 10/25/2013 0419    Lab Results  Component Value Date/Time   CHOL 215* 08/17/2013 01:36 AM    No components found for: HGA1C  Lab Results  Component Value Date/Time   AST 17 02/15/2014 10:40 AM    Assessment and Plan  Other specified diabetes mellitus without complications - Plan:  Results for orders placed or performed in visit on 04/30/14  HgB A1c  Result Value Ref Range   Hemoglobin A1C 6.0    Diabetes is well controlled, continue with current meds.Glucose (CBG), HgB A1c, glimepiride (AMARYL) 1 MG tablet, glucose monitoring kit (FREESTYLE) monitoring kit, Ambulatory referral to Ophthalmology  Essential hypertension - Plan:patient is advised for DASH diet, since her blood pressure is borderline elevated today, refill  done  hydrALAZINE (APRESOLINE) 25 MG tablet, COMPLETE METABOLIC  PANEL WITH GFR, COMPLETE METABOLIC PANEL WITH GFR  Congestive heart failure, unspecified congestive heart failure chronicity, unspecified congestive heart failure type - Plan: continue with current meds, she is scheduled to follow with the cardiologist furosemide (LASIX) 80 MG tablet, COMPLETE METABOLIC PANEL WITH GFR  Renal insufficiency - Plan: will repeat blood chemistry, patient is scheduled to follow up with her nephrologist COMPLETE METABOLIC PANEL WITH GFR   Health Maintenance  -Vaccinations:  Patient declines for flu shot   No Follow-up on file.  Lorayne Marek, MD

## 2014-04-30 NOTE — Patient Instructions (Signed)
DASH Eating Plan °DASH stands for "Dietary Approaches to Stop Hypertension." The DASH eating plan is a healthy eating plan that has been shown to reduce high blood pressure (hypertension). Additional health benefits may include reducing the risk of type 2 diabetes mellitus, heart disease, and stroke. The DASH eating plan may also help with weight loss. °WHAT DO I NEED TO KNOW ABOUT THE DASH EATING PLAN? °For the DASH eating plan, you will follow these general guidelines: °· Choose foods with a percent daily value for sodium of less than 5% (as listed on the food label). °· Use salt-free seasonings or herbs instead of table salt or sea salt. °· Check with your health care provider or pharmacist before using salt substitutes. °· Eat lower-sodium products, often labeled as "lower sodium" or "no salt added." °· Eat fresh foods. °· Eat more vegetables, fruits, and low-fat dairy products. °· Choose whole grains. Look for the word "whole" as the first word in the ingredient list. °· Choose fish and skinless chicken or turkey more often than red meat. Limit fish, poultry, and meat to 6 oz (170 g) each day. °· Limit sweets, desserts, sugars, and sugary drinks. °· Choose heart-healthy fats. °· Limit cheese to 1 oz (28 g) per day. °· Eat more home-cooked food and less restaurant, buffet, and fast food. °· Limit fried foods. °· Cook foods using methods other than frying. °· Limit canned vegetables. If you do use them, rinse them well to decrease the sodium. °· When eating at a restaurant, ask that your food be prepared with less salt, or no salt if possible. °WHAT FOODS CAN I EAT? °Seek help from a dietitian for individual calorie needs. °Grains °Whole grain or whole wheat bread. Brown rice. Whole grain or whole wheat pasta. Quinoa, bulgur, and whole grain cereals. Low-sodium cereals. Corn or whole wheat flour tortillas. Whole grain cornbread. Whole grain crackers. Low-sodium crackers. °Vegetables °Fresh or frozen vegetables  (raw, steamed, roasted, or grilled). Low-sodium or reduced-sodium tomato and vegetable juices. Low-sodium or reduced-sodium tomato sauce and paste. Low-sodium or reduced-sodium canned vegetables.  °Fruits °All fresh, canned (in natural juice), or frozen fruits. °Meat and Other Protein Products °Ground beef (85% or leaner), grass-fed beef, or beef trimmed of fat. Skinless chicken or turkey. Ground chicken or turkey. Pork trimmed of fat. All fish and seafood. Eggs. Dried beans, peas, or lentils. Unsalted nuts and seeds. Unsalted canned beans. °Dairy °Low-fat dairy products, such as skim or 1% milk, 2% or reduced-fat cheeses, low-fat ricotta or cottage cheese, or plain low-fat yogurt. Low-sodium or reduced-sodium cheeses. °Fats and Oils °Tub margarines without trans fats. Light or reduced-fat mayonnaise and salad dressings (reduced sodium). Avocado. Safflower, olive, or canola oils. Natural peanut or almond butter. °Other °Unsalted popcorn and pretzels. °The items listed above may not be a complete list of recommended foods or beverages. Contact your dietitian for more options. °WHAT FOODS ARE NOT RECOMMENDED? °Grains °White bread. White pasta. White rice. Refined cornbread. Bagels and croissants. Crackers that contain trans fat. °Vegetables °Creamed or fried vegetables. Vegetables in a cheese sauce. Regular canned vegetables. Regular canned tomato sauce and paste. Regular tomato and vegetable juices. °Fruits °Dried fruits. Canned fruit in light or heavy syrup. Fruit juice. °Meat and Other Protein Products °Fatty cuts of meat. Ribs, chicken wings, bacon, sausage, bologna, salami, chitterlings, fatback, hot dogs, bratwurst, and packaged luncheon meats. Salted nuts and seeds. Canned beans with salt. °Dairy °Whole or 2% milk, cream, half-and-half, and cream cheese. Whole-fat or sweetened yogurt. Full-fat   cheeses or blue cheese. Nondairy creamers and whipped toppings. Processed cheese, cheese spreads, or cheese  curds. °Condiments °Onion and garlic salt, seasoned salt, table salt, and sea salt. Canned and packaged gravies. Worcestershire sauce. Tartar sauce. Barbecue sauce. Teriyaki sauce. Soy sauce, including reduced sodium. Steak sauce. Fish sauce. Oyster sauce. Cocktail sauce. Horseradish. Ketchup and mustard. Meat flavorings and tenderizers. Bouillon cubes. Hot sauce. Tabasco sauce. Marinades. Taco seasonings. Relishes. °Fats and Oils °Butter, stick margarine, lard, shortening, ghee, and bacon fat. Coconut, palm kernel, or palm oils. Regular salad dressings. °Other °Pickles and olives. Salted popcorn and pretzels. °The items listed above may not be a complete list of foods and beverages to avoid. Contact your dietitian for more information. °WHERE CAN I FIND MORE INFORMATION? °National [employer], [employer], [employer] Institute: www.nhlbi.nih.gov/health/health-topics/topics/dash/ °Document Released: 02/22/2011 Document Revised: 07/20/2013 Document Reviewed: 01/07/2013 °Exit[employer]® Patient Information ©2015 [employer]. This information is not intended to replace advice given to you by your health care provider. Make sure you discuss any questions you have with your health care provider. ° °

## 2014-04-30 NOTE — Progress Notes (Signed)
Patient here for follow up on her diabetes and htn Requesting refills on her medications

## 2014-05-01 LAB — COMPLETE METABOLIC PANEL WITH GFR
ALT: 45 U/L — ABNORMAL HIGH (ref 0–35)
AST: 36 U/L (ref 0–37)
Albumin: 3.2 g/dL — ABNORMAL LOW (ref 3.5–5.2)
Alkaline Phosphatase: 156 U/L — ABNORMAL HIGH (ref 39–117)
BILIRUBIN TOTAL: 0.3 mg/dL (ref 0.2–1.2)
BUN: 43 mg/dL — AB (ref 6–23)
CO2: 25 meq/L (ref 19–32)
Calcium: 8.6 mg/dL (ref 8.4–10.5)
Chloride: 106 mEq/L (ref 96–112)
Creat: 3.24 mg/dL — ABNORMAL HIGH (ref 0.50–1.10)
GFR, EST AFRICAN AMERICAN: 16 mL/min — AB
GFR, Est Non African American: 14 mL/min — ABNORMAL LOW
GLUCOSE: 82 mg/dL (ref 70–99)
Potassium: 4.1 mEq/L (ref 3.5–5.3)
SODIUM: 139 meq/L (ref 135–145)
TOTAL PROTEIN: 5.9 g/dL — AB (ref 6.0–8.3)

## 2014-05-04 ENCOUNTER — Encounter (INDEPENDENT_AMBULATORY_CARE_PROVIDER_SITE_OTHER): Payer: Self-pay | Admitting: Ophthalmology

## 2014-05-07 ENCOUNTER — Telehealth: Payer: Self-pay | Admitting: Emergency Medicine

## 2014-05-07 NOTE — Telephone Encounter (Signed)
Patient called looking for her DMV paperwork, I told her I would call her back today

## 2014-05-07 NOTE — Telephone Encounter (Signed)
Patient needs DMV forms filled out, I cannot locate them, and there was no tele log made. I asked patient to bring the paperwork in so it could be re-processed.

## 2014-05-10 ENCOUNTER — Encounter (INDEPENDENT_AMBULATORY_CARE_PROVIDER_SITE_OTHER): Payer: Self-pay | Admitting: Ophthalmology

## 2014-05-13 ENCOUNTER — Telehealth: Payer: Self-pay

## 2014-05-13 NOTE — Telephone Encounter (Signed)
-----   Message from Doris Cheadle, MD sent at 05/04/2014  9:32 AM EST ----- Call and let the patient know that her renal function is improving , patient is following up with nephrologist

## 2014-05-13 NOTE — Telephone Encounter (Signed)
Patient not available Left message on voice mail for patient to return our call

## 2014-05-17 ENCOUNTER — Encounter (INDEPENDENT_AMBULATORY_CARE_PROVIDER_SITE_OTHER): Payer: Commercial Managed Care - HMO | Admitting: Ophthalmology

## 2014-05-17 DIAGNOSIS — H35033 Hypertensive retinopathy, bilateral: Secondary | ICD-10-CM

## 2014-05-17 DIAGNOSIS — H4312 Vitreous hemorrhage, left eye: Secondary | ICD-10-CM

## 2014-05-17 DIAGNOSIS — E11351 Type 2 diabetes mellitus with proliferative diabetic retinopathy with macular edema: Secondary | ICD-10-CM | POA: Diagnosis not present

## 2014-05-17 DIAGNOSIS — E11311 Type 2 diabetes mellitus with unspecified diabetic retinopathy with macular edema: Secondary | ICD-10-CM

## 2014-05-17 DIAGNOSIS — H43813 Vitreous degeneration, bilateral: Secondary | ICD-10-CM | POA: Diagnosis not present

## 2014-05-17 DIAGNOSIS — H2512 Age-related nuclear cataract, left eye: Secondary | ICD-10-CM

## 2014-05-17 DIAGNOSIS — I10 Essential (primary) hypertension: Secondary | ICD-10-CM | POA: Diagnosis not present

## 2014-05-28 ENCOUNTER — Telehealth: Payer: Self-pay | Admitting: Cardiovascular Disease

## 2014-05-28 NOTE — Telephone Encounter (Signed)
Error

## 2014-05-31 ENCOUNTER — Ambulatory Visit (INDEPENDENT_AMBULATORY_CARE_PROVIDER_SITE_OTHER): Payer: Commercial Managed Care - HMO | Admitting: Cardiovascular Disease

## 2014-05-31 ENCOUNTER — Encounter: Payer: Self-pay | Admitting: Cardiovascular Disease

## 2014-05-31 VITALS — BP 144/78 | HR 66 | Ht 61.0 in | Wt 127.1 lb

## 2014-05-31 DIAGNOSIS — I5020 Unspecified systolic (congestive) heart failure: Secondary | ICD-10-CM

## 2014-05-31 NOTE — Patient Instructions (Signed)
Your physician recommends that you schedule a follow-up appointment in: 3 MONTHS WITH DR. TURNER   Your physician recommends that you continue on your current medications as directed. Please refer to the Current Medication list given to you today.   

## 2014-05-31 NOTE — Progress Notes (Signed)
Patient ID: [name], female   DOB: January 10, 1951, 64 y.o.   MRN: [id number]     Cardiology Office Note   Date:  05/31/2014   ID:  [name], DOB Jan 18, 1951, MRN [id number]  PCP:  Jordan Marek, MD  Cardiologist:   Timmie Foerster chief complaint on file.     History of Present Illness: [name] is a  64 y.o. female who history of hypertension, diabetes, CHF, chronic renal insufficiency  Previously seen by Dr Radford Pax and [facility]  In hospital 8/15 with worseing renal function and CHF  Cr running 3.24  Initially seen by Dr Radford Pax June 15  Seems that her biggest issue is fluid overload from She is on lasix 80 bid with plus 1 LE edema She is seeing Dr Joelyn Oms for renal regularly No chest pain dyspnea Compliant with meds     Echo 6/15   Reviewed  Study Conclusions  - Left ventricle: The cavity size was normal. Wall thickness was increased in a pattern of mild LVH. Systolic function was mildly reduced. The estimated ejection fraction was in the range of 45% to 50%. Wall motion was normal; there were no regional wall motion abnormalities. Doppler parameters are consistent with abnormal left ventricular relaxation (grade 1 diastolic dysfunction). Doppler parameters are consistent with high ventricular filling pressure.  Reviewed Myovue done 8/15  IMPRESSION: No evidence for pharmacological induced ischemia.  Fixed defect involving the inferolateral wall with associated wall motion abnormalities. Findings are suggestive for an area of infarct.  Calculated ejection fraction is 40%.  Dr Debara Pickett indicated medical Rx due to lack of chest pain and advanced CRF   Past Medical History  Diagnosis Date  . Hypertension   . Diabetes mellitus   . Anemia   . Hyperlipidemia   . Left ventricular hypertrophy     Past Surgical History  Procedure Laterality Date  . Cholecystectomy       Current Outpatient Prescriptions  Medication Sig Dispense Refill  .  amLODipine (NORVASC) 10 MG tablet Take 1 tablet (10 mg total) by mouth every morning. 30 tablet 3  . aspirin EC 81 MG tablet Take 81 mg by mouth every morning.    . brimonidine-timolol (COMBIGAN) 0.2-0.5 % ophthalmic solution Place 1 drop into both eyes 3 (three) times daily. 1 Bottle 5  . carvedilol (COREG) 12.5 MG tablet Take 1 tablet (12.5 mg total) by mouth 2 (two) times daily with a meal. 60 tablet 3  . ferrous sulfate 325 (65 FE) MG tablet Take 1 tablet (325 mg total) by mouth 2 (two) times daily with a meal. 60 tablet 3  . furosemide (LASIX) 80 MG tablet Take 1 tablet (80 mg total) by mouth 2 (two) times daily. 90 tablet 3  . glimepiride (AMARYL) 1 MG tablet Take 1 tablet (1 mg total) by mouth daily before breakfast. 30 tablet 0  . glucose monitoring kit (FREESTYLE) monitoring kit 1 each by Does not apply route 4 (four) times daily - after meals and at bedtime. 1 month Diabetic Testing Supplies for QAC-QHS accuchecks. 1 each 1  . hydrALAZINE (APRESOLINE) 25 MG tablet Take 1 tablet (25 mg total) by mouth every 8 (eight) hours. 90 tablet 3  . isosorbide mononitrate (IMDUR) 15 mg TB24 24 hr tablet Take 0.5 tablets (15 mg total) by mouth daily. 30 tablet 3  . pravastatin (PRAVACHOL) 40 MG tablet Take 1 tablet (40 mg total) by mouth daily. 30 tablet 7  . trimethoprim-polymyxin b (POLYTRIM)  ophthalmic solution Place 2 drops into both eyes daily.     No current facility-administered medications for this visit.    Allergies:   Nitrofurantoin monohyd macro    Social History:  The patient  reports that she quit smoking about 48 years ago. Her smoking use included Cigarettes. She has never used smokeless tobacco. She reports that she does not drink alcohol or use illicit drugs.   Family History:  The patient's family history includes Diabetes in her mother; Diabetes type II in an other family member; Hypertension in an other family member.    ROS:  Please see the history of present illness.    Otherwise, review of systems are positive for none.   All other systems are reviewed and negative.    PHYSICAL EXAM: VS:  BP 144/78 mmHg  Pulse 66  Ht 5' 1"  (1.549 m)  Wt 57.661 kg (127 lb 1.9 oz)  BMI 24.03 kg/m2 , BMI Body mass index is 24.03 kg/(m^2). GEN: Well nourished, well developed, in no acute distress HEENT: normal Neck: no JVD, carotid bruits, or masses Cardiac:  RRR; no murmurs, rubs, or gallops,no edema  Respiratory:  clear to auscultation bilaterally, normal work of breathing GI: soft, nontender, nondistended, + BS MS: no deformity or atrophy Skin: warm and dry, no rash Neuro:  Strength and sensation are intact Psych: euthymic mood, full affect   EKG:  SR LAD RBBB LVH    Recent Labs: 08/17/2013: Magnesium 2.2; TSH 1.440 10/21/2013: Pro B Natriuretic peptide (BNP) 32323.0* 10/23/2013: Hemoglobin 9.8*; Platelets 195 04/30/2014: ALT 45*; BUN 43*; Creatinine 3.24*; Potassium 4.1; Sodium 139    Lipid Panel    Component Value Date/Time   CHOL 215* 08/17/2013 0136   TRIG 98 08/17/2013 0136   HDL 75 08/17/2013 0136   CHOLHDL 2.9 08/17/2013 0136   VLDL 20 08/17/2013 0136   LDLCALC 120* 08/17/2013 0136      Wt Readings from Last 3 Encounters:  05/31/14 57.661 kg (127 lb 1.9 oz)  04/30/14 56.7 kg (125 lb)  03/01/14 54.885 kg (121 lb)      Other studies Reviewed: Additional studies/ records that were reviewed today include: Epic records  Myovue and Echo .    ASSESSMENT AND PLAN:  1.  CHF:  EF 45%  myovue no ischemia  Continue hydralazine / nitrates current dose of lasix  Renal can decide if they want to increase it for LE edema Such as demedex or zaroxyln M,W,F but this will worsen her renal function 2. CAD  Presumed based on myovue  No chest pain no cath due to renal failure nitrates asa  F/u Dr Radford Pax 3.  CRF;  F/u nephrology not on ACe 4. DM  Discussed low carb diet.  Target hemoglobin A1c is 6.5 or less.  Continue current medications.    Current  medicines are reviewed at length with the patient today.  The patient does not have concerns regarding medicines.  The following changes have been made:  no change  Labs/ tests ordered today include: none  No orders of the defined types were placed in this encounter.     Disposition:   FU with  Dr Radford Pax in 3 months       Signed, Jenkins Rouge, MD  05/31/2014 10:50 AM    [address], [place], [place]  [address] Phone: [phone]; Fax: [phone]

## 2014-06-07 ENCOUNTER — Encounter (INDEPENDENT_AMBULATORY_CARE_PROVIDER_SITE_OTHER): Payer: Commercial Managed Care - HMO | Admitting: Ophthalmology

## 2014-06-11 ENCOUNTER — Ambulatory Visit: Payer: Commercial Managed Care - HMO | Attending: Internal Medicine | Admitting: Internal Medicine

## 2014-06-11 ENCOUNTER — Encounter: Payer: Self-pay | Admitting: Internal Medicine

## 2014-06-11 VITALS — BP 140/80 | HR 66 | Temp 97.6°F | Resp 16 | Wt 135.0 lb

## 2014-06-11 DIAGNOSIS — I10 Essential (primary) hypertension: Secondary | ICD-10-CM | POA: Diagnosis not present

## 2014-06-11 DIAGNOSIS — N289 Disorder of kidney and ureter, unspecified: Secondary | ICD-10-CM | POA: Diagnosis not present

## 2014-06-11 DIAGNOSIS — R609 Edema, unspecified: Secondary | ICD-10-CM

## 2014-06-11 DIAGNOSIS — M7989 Other specified soft tissue disorders: Secondary | ICD-10-CM | POA: Insufficient documentation

## 2014-06-11 NOTE — Progress Notes (Signed)
MRN: [id number] Name: [name]  Sex: female Age: 64 y.o. DOB: 03/12/1951  Allergies: Nitrofurantoin monohyd macro  Chief Complaint  Patient presents with  . Foot Swelling    HPI: Patient is 64 y.o. female who history of hypertension, CHF, chronic renal insufficiency, diabetes comes today her major concern his lower extremity edema, EMR reviewed patient was recently evaluated by cardiologist and was advised to continue with her current medications until she seen by her nephrologist since she has renal insufficiency and is already on Lasix 80 mg twice a day, she denies any orthopnea or PND.denies chest pain or shortness of breath.  Past Medical History  Diagnosis Date  . Hypertension   . Diabetes mellitus   . Anemia   . Hyperlipidemia   . Left ventricular hypertrophy     Past Surgical History  Procedure Laterality Date  . Cholecystectomy        Medication List       This list is accurate as of: 06/11/14 10:51 AM.  Always use your most recent med list.               amLODipine 10 MG tablet  Commonly known as:  NORVASC  Take 1 tablet (10 mg total) by mouth every morning.     aspirin EC 81 MG tablet  Take 81 mg by mouth every morning.     brimonidine-timolol 0.2-0.5 % ophthalmic solution  Commonly known as:  COMBIGAN  Place 1 drop into both eyes 3 (three) times daily.     carvedilol 12.5 MG tablet  Commonly known as:  COREG  Take 1 tablet (12.5 mg total) by mouth 2 (two) times daily with a meal.     ferrous sulfate 325 (65 FE) MG tablet  Take 1 tablet (325 mg total) by mouth 2 (two) times daily with a meal.     furosemide 80 MG tablet  Commonly known as:  LASIX  Take 1 tablet (80 mg total) by mouth 2 (two) times daily.     glimepiride 1 MG tablet  Commonly known as:  AMARYL  Take 1 tablet (1 mg total) by mouth daily before breakfast.     glucose monitoring kit monitoring kit  1 each by Does not apply route 4 (four) times daily - after meals and  at bedtime. 1 month Diabetic Testing Supplies for QAC-QHS accuchecks.     hydrALAZINE 25 MG tablet  Commonly known as:  APRESOLINE  Take 1 tablet (25 mg total) by mouth every 8 (eight) hours.     isosorbide mononitrate 15 mg Tb24 24 hr tablet  Commonly known as:  IMDUR  Take 0.5 tablets (15 mg total) by mouth daily.     pravastatin 40 MG tablet  Commonly known as:  PRAVACHOL  Take 1 tablet (40 mg total) by mouth daily.     trimethoprim-polymyxin b ophthalmic solution  Commonly known as:  POLYTRIM  Place 2 drops into both eyes daily.        No orders of the defined types were placed in this encounter.    Immunization History  Administered Date(s) Administered  . Pneumococcal Polysaccharide-23 08/18/2013    Family History  Problem Relation Age of Onset  . Diabetes type II    . Hypertension    . Diabetes Mother     History  Substance Use Topics  . Smoking status: Former Smoker    Types: Cigarettes    Quit date: 03/19/1966  . Smokeless tobacco: Never Used  .  Alcohol Use: No    Review of Systems   As noted in HPI  Filed Vitals:   06/11/14 1029  BP: 140/80  Pulse:   Temp:   Resp:     Physical Exam  Physical Exam  Constitutional: No distress.  Eyes: EOM are normal. Pupils are equal, round, and reactive to light.  Cardiovascular: Normal rate and regular rhythm.   Pulmonary/Chest: Breath sounds normal. No respiratory distress. She has no wheezes. She has no rales.  Musculoskeletal:  1+ pedal edema    CBC    Component Value Date/Time   WBC 4.1 10/23/2013 0509   WBC 4.2* 11/05/2012 1059   RBC 3.46* 10/23/2013 0509   RBC 3.52* 08/18/2013 1211   RBC 4.17 11/05/2012 1059   HGB 9.8* 10/23/2013 0509   HGB 11.7* 11/05/2012 1059   HCT 29.8* 10/23/2013 0509   HCT 37.0* 11/05/2012 1059   PLT 195 10/23/2013 0509   MCV 86.1 10/23/2013 0509   MCV 88.8 11/05/2012 1059   LYMPHSABS 1.6 09/02/2013 1512   MONOABS 0.6 09/02/2013 1512   EOSABS 0.2 09/02/2013  1512   BASOSABS 0.0 09/02/2013 1512    CMP     Component Value Date/Time   NA 139 04/30/2014 1026   K 4.1 04/30/2014 1026   CL 106 04/30/2014 1026   CO2 25 04/30/2014 1026   GLUCOSE 82 04/30/2014 1026   BUN 43* 04/30/2014 1026   CREATININE 3.24* 04/30/2014 1026   CREATININE 3.43* 10/25/2013 0419   CALCIUM 8.6 04/30/2014 1026   PROT 5.9* 04/30/2014 1026   ALBUMIN 3.2* 04/30/2014 1026   AST 36 04/30/2014 1026   ALT 45* 04/30/2014 1026   ALKPHOS 156* 04/30/2014 1026   BILITOT 0.3 04/30/2014 1026   GFRNONAA 14* 04/30/2014 1026   GFRNONAA 13* 10/25/2013 0419   GFRAA 16* 04/30/2014 1026   GFRAA 15* 10/25/2013 0419    Lab Results  Component Value Date/Time   CHOL 215* 08/17/2013 01:36 AM    No components found for: HGA1C  Lab Results  Component Value Date/Time   AST 36 04/30/2014 10:26 AM    Assessment and Plan  Essential hypertension Blood pressure is borderline elevated, advise patient for DASH diet.continue with Coreg, hydralazine,imdur.  Dependent edema Patient is already on Lasix 80 mg twice a day, she has been advised for low-salt diet and leg elevation.  Renal insufficiency Patient to follow up with her nephrologist and has a scheduled appointment next month.    Return in about 3 months (around 09/11/2014), or if symptoms worsen or fail to improve.   This note has been created with Surveyor, quantity. Any transcriptional errors are unintentional.    Lorayne Marek, MD

## 2014-06-11 NOTE — Progress Notes (Signed)
Patient complains of bilateral feet swelling the past couple of weeks Patient states she elevates her feet at night and they are fine when she wakes Up.  The feet start to swell then she gets up and starts her day

## 2014-06-11 NOTE — Patient Instructions (Signed)
DASH Eating Plan DASH stands for "Dietary Approaches to Stop Hypertension." The DASH eating plan is a healthy eating plan that has been shown to reduce high blood pressure (hypertension). Additional health benefits may include reducing the risk of type 2 diabetes mellitus, heart disease, and stroke. The DASH eating plan may also help with weight loss. WHAT DO I NEED TO KNOW ABOUT THE DASH EATING PLAN? For the DASH eating plan, you will follow these general guidelines:  Choose foods with a percent daily value for sodium of less than 5% (as listed on the food label).  Use salt-free seasonings or herbs instead of table salt or sea salt.  Check with your health care provider or pharmacist before using salt substitutes.  Eat lower-sodium products, often labeled as "lower sodium" or "no salt added."  Eat fresh foods.  Eat more vegetables, fruits, and low-fat dairy products.  Choose whole grains. Look for the word "whole" as the first word in the ingredient list.  Choose fish and skinless chicken or turkey more often than red meat. Limit fish, poultry, and meat to 6 oz (170 g) each day.  Limit sweets, desserts, sugars, and sugary drinks.  Choose heart-healthy fats.  Limit cheese to 1 oz (28 g) per day.  Eat more home-cooked food and less restaurant, buffet, and fast food.  Limit fried foods.  Cook foods using methods other than frying.  Limit canned vegetables. If you do use them, rinse them well to decrease the sodium.  When eating at a restaurant, ask that your food be prepared with less salt, or no salt if possible. WHAT FOODS CAN I EAT? Seek help from a dietitian for individual calorie needs. Grains Whole grain or whole wheat bread. Brown rice. Whole grain or whole wheat pasta. Quinoa, bulgur, and whole grain cereals. Low-sodium cereals. Corn or whole wheat flour tortillas. Whole grain cornbread. Whole grain crackers. Low-sodium crackers. Vegetables Fresh or frozen vegetables  (raw, steamed, roasted, or grilled). Low-sodium or reduced-sodium tomato and vegetable juices. Low-sodium or reduced-sodium tomato sauce and paste. Low-sodium or reduced-sodium canned vegetables.  Fruits All fresh, canned (in natural juice), or frozen fruits. Meat and Other Protein Products Ground beef (85% or leaner), grass-fed beef, or beef trimmed of fat. Skinless chicken or turkey. Ground chicken or turkey. Pork trimmed of fat. All fish and seafood. Eggs. Dried beans, peas, or lentils. Unsalted nuts and seeds. Unsalted canned beans. Dairy Low-fat dairy products, such as skim or 1% milk, 2% or reduced-fat cheeses, low-fat ricotta or cottage cheese, or plain low-fat yogurt. Low-sodium or reduced-sodium cheeses. Fats and Oils Tub margarines without trans fats. Light or reduced-fat mayonnaise and salad dressings (reduced sodium). Avocado. Safflower, olive, or canola oils. Natural peanut or almond butter. Other Unsalted popcorn and pretzels. The items listed above may not be a complete list of recommended foods or beverages. Contact your dietitian for more options. WHAT FOODS ARE NOT RECOMMENDED? Grains White bread. White pasta. White rice. Refined cornbread. Bagels and croissants. Crackers that contain trans fat. Vegetables Creamed or fried vegetables. Vegetables in a cheese sauce. Regular canned vegetables. Regular canned tomato sauce and paste. Regular tomato and vegetable juices. Fruits Dried fruits. Canned fruit in light or heavy syrup. Fruit juice. Meat and Other Protein Products Fatty cuts of meat. Ribs, chicken wings, bacon, sausage, bologna, salami, chitterlings, fatback, hot dogs, bratwurst, and packaged luncheon meats. Salted nuts and seeds. Canned beans with salt. Dairy Whole or 2% milk, cream, half-and-half, and cream cheese. Whole-fat or sweetened yogurt. Full-fat   cheeses or blue cheese. Nondairy creamers and whipped toppings. Processed cheese, cheese spreads, or cheese  curds. Condiments Onion and garlic salt, seasoned salt, table salt, and sea salt. Canned and packaged gravies. Worcestershire sauce. Tartar sauce. Barbecue sauce. Teriyaki sauce. Soy sauce, including reduced sodium. Steak sauce. Fish sauce. Oyster sauce. Cocktail sauce. Horseradish. Ketchup and mustard. Meat flavorings and tenderizers. Bouillon cubes. Hot sauce. Tabasco sauce. Marinades. Taco seasonings. Relishes. Fats and Oils Butter, stick margarine, lard, shortening, ghee, and bacon fat. Coconut, palm kernel, or palm oils. Regular salad dressings. Other Pickles and olives. Salted popcorn and pretzels. The items listed above may not be a complete list of foods and beverages to avoid. Contact your dietitian for more information. WHERE CAN I FIND MORE INFORMATION? National Heart, Lung, and Blood Institute: www.nhlbi.nih.gov/health/health-topics/topics/dash/ Document Released: 02/22/2011 Document Revised: 07/20/2013 Document Reviewed: 01/07/2013 ExitCare Patient Information 2015 [employer]. This information is not intended to replace advice given to you by your health care provider. Make sure you discuss any questions you have with your health care provider. Diabetes Mellitus and Food It is important for you to manage your blood sugar (glucose) level. Your blood glucose level can be greatly affected by what you eat. Eating healthier foods in the appropriate amounts throughout the day at about the same time each day will help you control your blood glucose level. It can also help slow or prevent worsening of your diabetes mellitus. Healthy eating may even help you improve the level of your blood pressure and reach or maintain a healthy weight.  HOW CAN FOOD AFFECT ME? Carbohydrates Carbohydrates affect your blood glucose level more than any other type of food. Your dietitian will help you determine how many carbohydrates to eat at each meal and teach you how to count carbohydrates. Counting  carbohydrates is important to keep your blood glucose at a healthy level, especially if you are using insulin or taking certain medicines for diabetes mellitus. Alcohol Alcohol can cause sudden decreases in blood glucose (hypoglycemia), especially if you use insulin or take certain medicines for diabetes mellitus. Hypoglycemia can be a life-threatening condition. Symptoms of hypoglycemia (sleepiness, dizziness, and disorientation) are similar to symptoms of having too much alcohol.  If your health care provider has given you approval to drink alcohol, do so in moderation and use the following guidelines:  Women should not have more than one drink per day, and men should not have more than two drinks per day. One drink is equal to:  12 oz of beer.  5 oz of wine.  1 oz of hard liquor.  Do not drink on an empty stomach.  Keep yourself hydrated. Have water, diet soda, or unsweetened iced tea.  Regular soda, juice, and other mixers might contain a lot of carbohydrates and should be counted. WHAT FOODS ARE NOT RECOMMENDED? As you make food choices, it is important to remember that all foods are not the same. Some foods have fewer nutrients per serving than other foods, even though they might have the same number of calories or carbohydrates. It is difficult to get your body what it needs when you eat foods with fewer nutrients. Examples of foods that you should avoid that are high in calories and carbohydrates but low in nutrients include:  Trans fats (most processed foods list trans fats on the Nutrition Facts label).  Regular soda.  Juice.  Candy.  Sweets, such as cake, pie, doughnuts, and cookies.  Fried foods. WHAT FOODS CAN I EAT? Have nutrient-rich foods,   which will nourish your body and keep you healthy. The food you should eat also will depend on several factors, including:  The calories you need.  The medicines you take.  Your weight.  Your blood glucose level.  Your  blood pressure level.  Your cholesterol level. You also should eat a variety of foods, including:  Protein, such as meat, poultry, fish, tofu, nuts, and seeds (lean animal proteins are best).  Fruits.  Vegetables.  Dairy products, such as milk, cheese, and yogurt (low fat is best).  Breads, grains, pasta, cereal, rice, and beans.  Fats such as olive oil, trans fat-free margarine, canola oil, avocado, and olives. DOES EVERYONE WITH DIABETES MELLITUS HAVE THE SAME MEAL PLAN? Because every person with diabetes mellitus is different, there is not one meal plan that works for everyone. It is very important that you meet with a dietitian who will help you create a meal plan that is just right for you. Document Released: 11/30/2004 Document Revised: 03/10/2013 Document Reviewed: 01/30/2013 ExitCare Patient Information 2015 [employer]. This information is not intended to replace advice given to you by your health care provider. Make sure you discuss any questions you have with your health care provider.  

## 2014-06-18 ENCOUNTER — Emergency Department (HOSPITAL_COMMUNITY): Payer: Commercial Managed Care - HMO

## 2014-06-18 ENCOUNTER — Inpatient Hospital Stay (HOSPITAL_COMMUNITY)
Admission: EM | Admit: 2014-06-18 | Discharge: 2014-06-22 | DRG: 292 | Disposition: A | Discharge status: Home / Self Care | Payer: Commercial Managed Care - HMO | Attending: Internal Medicine | Admitting: Internal Medicine

## 2014-06-18 ENCOUNTER — Encounter (HOSPITAL_COMMUNITY): Payer: Self-pay | Admitting: *Deleted

## 2014-06-18 DIAGNOSIS — I129 Hypertensive chronic kidney disease with stage 1 through stage 4 chronic kidney disease, or unspecified chronic kidney disease: Secondary | ICD-10-CM | POA: Diagnosis present

## 2014-06-18 DIAGNOSIS — I5043 Acute on chronic combined systolic (congestive) and diastolic (congestive) heart failure: Principal | ICD-10-CM

## 2014-06-18 DIAGNOSIS — N289 Disorder of kidney and ureter, unspecified: Secondary | ICD-10-CM | POA: Diagnosis present

## 2014-06-18 DIAGNOSIS — D631 Anemia in chronic kidney disease: Secondary | ICD-10-CM | POA: Diagnosis present

## 2014-06-18 DIAGNOSIS — N184 Chronic kidney disease, stage 4 (severe): Secondary | ICD-10-CM | POA: Diagnosis not present

## 2014-06-18 DIAGNOSIS — I10 Essential (primary) hypertension: Secondary | ICD-10-CM

## 2014-06-18 DIAGNOSIS — Z888 Allergy status to other drugs, medicaments and biological substances status: Secondary | ICD-10-CM

## 2014-06-18 DIAGNOSIS — Z9049 Acquired absence of other specified parts of digestive tract: Secondary | ICD-10-CM | POA: Diagnosis present

## 2014-06-18 DIAGNOSIS — I5033 Acute on chronic diastolic (congestive) heart failure: Secondary | ICD-10-CM | POA: Diagnosis not present

## 2014-06-18 DIAGNOSIS — Z7982 Long term (current) use of aspirin: Secondary | ICD-10-CM | POA: Diagnosis not present

## 2014-06-18 DIAGNOSIS — Z87891 Personal history of nicotine dependence: Secondary | ICD-10-CM | POA: Diagnosis not present

## 2014-06-18 DIAGNOSIS — E785 Hyperlipidemia, unspecified: Secondary | ICD-10-CM | POA: Diagnosis present

## 2014-06-18 DIAGNOSIS — R635 Abnormal weight gain: Secondary | ICD-10-CM | POA: Diagnosis not present

## 2014-06-18 DIAGNOSIS — I5021 Acute systolic (congestive) heart failure: Secondary | ICD-10-CM

## 2014-06-18 DIAGNOSIS — I509 Heart failure, unspecified: Secondary | ICD-10-CM | POA: Insufficient documentation

## 2014-06-18 DIAGNOSIS — I420 Dilated cardiomyopathy: Secondary | ICD-10-CM | POA: Diagnosis present

## 2014-06-18 DIAGNOSIS — E1121 Type 2 diabetes mellitus with diabetic nephropathy: Secondary | ICD-10-CM | POA: Diagnosis present

## 2014-06-18 DIAGNOSIS — D649 Anemia, unspecified: Secondary | ICD-10-CM | POA: Diagnosis not present

## 2014-06-18 DIAGNOSIS — N189 Chronic kidney disease, unspecified: Secondary | ICD-10-CM

## 2014-06-18 DIAGNOSIS — R7989 Other specified abnormal findings of blood chemistry: Secondary | ICD-10-CM

## 2014-06-18 DIAGNOSIS — Z794 Long term (current) use of insulin: Secondary | ICD-10-CM | POA: Diagnosis not present

## 2014-06-18 DIAGNOSIS — R778 Other specified abnormalities of plasma proteins: Secondary | ICD-10-CM

## 2014-06-18 DIAGNOSIS — N179 Acute kidney failure, unspecified: Secondary | ICD-10-CM

## 2014-06-18 DIAGNOSIS — R600 Localized edema: Secondary | ICD-10-CM

## 2014-06-18 DIAGNOSIS — E118 Type 2 diabetes mellitus with unspecified complications: Secondary | ICD-10-CM

## 2014-06-18 DIAGNOSIS — E119 Type 2 diabetes mellitus without complications: Secondary | ICD-10-CM

## 2014-06-18 DIAGNOSIS — E1129 Type 2 diabetes mellitus with other diabetic kidney complication: Secondary | ICD-10-CM | POA: Diagnosis not present

## 2014-06-18 LAB — CBC WITH DIFFERENTIAL/PLATELET
BASOS PCT: 1 % (ref 0–1)
Basophils Absolute: 0 10*3/uL (ref 0.0–0.1)
Eosinophils Absolute: 0.1 10*3/uL (ref 0.0–0.7)
Eosinophils Relative: 3 % (ref 0–5)
HCT: 29.8 % — ABNORMAL LOW (ref 36.0–46.0)
Hemoglobin: 9.7 g/dL — ABNORMAL LOW (ref 12.0–15.0)
LYMPHS ABS: 1.3 10*3/uL (ref 0.7–4.0)
Lymphocytes Relative: 42 % (ref 12–46)
MCH: 26.9 pg (ref 26.0–34.0)
MCHC: 32.6 g/dL (ref 30.0–36.0)
MCV: 82.5 fL (ref 78.0–100.0)
Monocytes Absolute: 0.5 10*3/uL (ref 0.1–1.0)
Monocytes Relative: 17 % — ABNORMAL HIGH (ref 3–12)
NEUTROS ABS: 1.2 10*3/uL — AB (ref 1.7–7.7)
NEUTROS PCT: 37 % — AB (ref 43–77)
PLATELETS: 195 10*3/uL (ref 150–400)
RBC: 3.61 MIL/uL — AB (ref 3.87–5.11)
RDW: 14.7 % (ref 11.5–15.5)
WBC: 3.2 10*3/uL — ABNORMAL LOW (ref 4.0–10.5)

## 2014-06-18 LAB — BASIC METABOLIC PANEL
ANION GAP: 11 (ref 5–15)
BUN: 61 mg/dL — ABNORMAL HIGH (ref 6–23)
CO2: 22 mmol/L (ref 19–32)
CREATININE: 3.94 mg/dL — AB (ref 0.50–1.10)
Calcium: 8.8 mg/dL (ref 8.4–10.5)
Chloride: 103 mmol/L (ref 96–112)
GFR calc Af Amer: 13 mL/min — ABNORMAL LOW (ref >90)
GFR calc non Af Amer: 11 mL/min — ABNORMAL LOW (ref >90)
Glucose, Bld: 143 mg/dL — ABNORMAL HIGH (ref 70–99)
POTASSIUM: 4.2 mmol/L (ref 3.5–5.1)
Sodium: 136 mmol/L (ref 135–145)

## 2014-06-18 LAB — CK TOTAL AND CKMB (NOT AT ARMC)
CK, MB: 4.4 ng/mL — AB (ref 0.3–4.0)
RELATIVE INDEX: 2.6 — AB (ref 0.0–2.5)
Total CK: 172 U/L (ref 7–177)

## 2014-06-18 LAB — GLUCOSE, CAPILLARY: Glucose-Capillary: 117 mg/dL — ABNORMAL HIGH (ref 70–99)

## 2014-06-18 LAB — BRAIN NATRIURETIC PEPTIDE: B Natriuretic Peptide: >4500 pg/mL — ABNORMAL HIGH (ref 0.0–100.0)

## 2014-06-18 LAB — TROPONIN I
TROPONIN I: 0.06 ng/mL — AB (ref <0.031)
Troponin I: 0.06 ng/mL — ABNORMAL HIGH (ref <0.031)

## 2014-06-18 MED ORDER — LIVING WELL WITH DIABETES BOOK
Freq: Once | Status: AC
Start: 2014-06-18 — End: 2014-06-18
  Administered 2014-06-18
  Filled 2014-06-18: qty 1

## 2014-06-18 MED ORDER — INSULIN ASPART 100 UNIT/ML ~~LOC~~ SOLN
0.0000 [IU] | Freq: Three times a day (TID) | SUBCUTANEOUS | Status: DC
  Administered 2014-06-19 – 2014-06-20 (×3): 2 [IU] via SUBCUTANEOUS
  Administered 2014-06-21: 3 [IU] via SUBCUTANEOUS
  Administered 2014-06-21: 2 [IU] via SUBCUTANEOUS

## 2014-06-18 MED ORDER — ACETAMINOPHEN 325 MG PO TABS: 650.0000 mg | ORAL_TABLET | ORAL | Status: DC | PRN

## 2014-06-18 MED ORDER — LIVING BETTER WITH HEART FAILURE BOOK
Freq: Once | Status: AC
  Administered 2014-06-18

## 2014-06-18 MED ORDER — INSULIN ASPART 100 UNIT/ML ~~LOC~~ SOLN: 0.0000 [IU] | Freq: Every day | SUBCUTANEOUS | Status: DC

## 2014-06-18 MED ORDER — HEPARIN SODIUM (PORCINE) 5000 UNIT/ML IJ SOLN
5000.0000 [IU] | Freq: Three times a day (TID) | INTRAMUSCULAR | Status: DC
  Administered 2014-06-18 – 2014-06-22 (×11): 5000 [IU] via SUBCUTANEOUS
  Filled 2014-06-18 (×12): qty 1

## 2014-06-18 MED ORDER — PRAVASTATIN SODIUM 40 MG PO TABS
40.0000 mg | ORAL_TABLET | Freq: Every day | ORAL | Status: DC
  Administered 2014-06-19 – 2014-06-21 (×3): 40 mg via ORAL
  Filled 2014-06-18 (×5): qty 1

## 2014-06-18 MED ORDER — SODIUM CHLORIDE 0.9 % IJ SOLN
3.0000 mL | Freq: Two times a day (BID) | INTRAMUSCULAR | Status: DC
  Administered 2014-06-18 – 2014-06-21 (×6): 3 mL via INTRAVENOUS

## 2014-06-18 MED ORDER — SODIUM CHLORIDE 0.9 % IV SOLN: 250.0000 mL | INTRAVENOUS | Status: DC | PRN

## 2014-06-18 MED ORDER — HYDRALAZINE HCL 50 MG PO TABS
50.0000 mg | ORAL_TABLET | Freq: Three times a day (TID) | ORAL | Status: DC
  Administered 2014-06-18 – 2014-06-21 (×9): 50 mg via ORAL
  Filled 2014-06-18 (×11): qty 1

## 2014-06-18 MED ORDER — KIDNEY FAILURE BOOK
Freq: Once | Status: AC
  Administered 2014-06-18
  Filled 2014-06-18: qty 1

## 2014-06-18 MED ORDER — BLOOD PRESSURE CONTROL BOOK
Freq: Once | Status: AC
  Administered 2014-06-18
  Filled 2014-06-18: qty 1

## 2014-06-18 MED ORDER — CARVEDILOL 12.5 MG PO TABS
12.5000 mg | ORAL_TABLET | Freq: Two times a day (BID) | ORAL | Status: DC
Start: 2014-06-18 — End: 2014-06-21
  Administered 2014-06-18 – 2014-06-20 (×5): 12.5 mg via ORAL
  Filled 2014-06-18 (×8): qty 1

## 2014-06-18 MED ORDER — HYDRALAZINE HCL 20 MG/ML IJ SOLN
10.0000 mg | INTRAMUSCULAR | Status: DC | PRN
  Administered 2014-06-19: 10 mg via INTRAVENOUS
  Filled 2014-06-18: qty 1

## 2014-06-18 MED ORDER — FUROSEMIDE 10 MG/ML IJ SOLN
60.0000 mg | Freq: Once | INTRAMUSCULAR | Status: AC
  Administered 2014-06-18: 60 mg via INTRAVENOUS
  Filled 2014-06-18: qty 6

## 2014-06-18 MED ORDER — ASPIRIN EC 81 MG PO TBEC
81.0000 mg | DELAYED_RELEASE_TABLET | Freq: Every morning | ORAL | Status: DC
  Administered 2014-06-19 – 2014-06-22 (×4): 81 mg via ORAL
  Filled 2014-06-18 (×4): qty 1

## 2014-06-18 MED ORDER — FERROUS SULFATE 325 (65 FE) MG PO TABS
325.0000 mg | ORAL_TABLET | Freq: Two times a day (BID) | ORAL | Status: DC
  Administered 2014-06-19 – 2014-06-20 (×4): 325 mg via ORAL
  Filled 2014-06-18 (×7): qty 1

## 2014-06-18 MED ORDER — ISOSORBIDE MONONITRATE ER 30 MG PO TB24
30.0000 mg | ORAL_TABLET | Freq: Every day | ORAL | Status: DC
  Filled 2014-06-18: qty 1

## 2014-06-18 MED ORDER — AMLODIPINE BESYLATE 10 MG PO TABS
10.0000 mg | ORAL_TABLET | Freq: Every morning | ORAL | Status: DC
  Administered 2014-06-19 – 2014-06-21 (×3): 10 mg via ORAL
  Filled 2014-06-18 (×4): qty 1

## 2014-06-18 MED ORDER — ONDANSETRON HCL 4 MG/2ML IJ SOLN: 4.0000 mg | Freq: Four times a day (QID) | INTRAMUSCULAR | Status: DC | PRN

## 2014-06-18 MED ORDER — SODIUM CHLORIDE 0.9 % IJ SOLN: 3.0000 mL | INTRAMUSCULAR | Status: DC | PRN

## 2014-06-18 NOTE — H&P (Signed)
[facility] History and Physical  Patient: [name]  MRN: [id number]  DOB: December 27, 1950  DOS: the patient was seen and examined on 06/18/2014 PCP: Lorayne Marek, MD  Chief Complaint: Leg swelling  HPI: [name] is a 64 y.o. female with Past medical history of chronic combined diastolic and systolic heart failure, essential hypertension, diabetes mellitus type 2, chronic kidney disease, chronic anemia. The patient is presenting with complaints of progressively worsening leg swelling and weight gain. Patient mentions that she has been having progressively worsening weight gain. She has 10 weight gain over last 2 weeks despite regular taking her Lasix. She also has been monitoring her weight regularly and salt intake. Although patient mentions that she does not remember all of her medications and is on amlodipine and isosorbide on her medication list. Patient is scheduled to follow-up with nephrology. Patient denies any chest pain chest heaviness chest tightness, shortness of breath, cough, congestion, fever, chills, leg tenderness, diarrhea, constipation, burning urination, abdominal pain, nausea, vomiting. She denies any change in appetite.  The patient is coming from home. And at her baseline independent for most of her ADL.  Review of Systems: as mentioned in the history of present illness.  A Comprehensive review of the other systems is negative.  Past Medical History  Diagnosis Date  . Hypertension   . Diabetes mellitus   . Anemia   . Hyperlipidemia   . Left ventricular hypertrophy    Past Surgical History  Procedure Laterality Date  . Cholecystectomy     Social History:  reports that she quit smoking about 48 years ago. Her smoking use included Cigarettes. She has never used smokeless tobacco. She reports that she does not drink alcohol or use illicit drugs.  Allergies  Allergen Reactions  . Nitrofurantoin Monohyd Macro Nausea Only    Family History    Problem Relation Age of Onset  . Diabetes type II    . Hypertension    . Diabetes Mother     Prior to Admission medications   Medication Sig Start Date End Date Taking? Authorizing Provider  amLODipine (NORVASC) 10 MG tablet Take 1 tablet (10 mg total) by mouth every morning. 02/15/14  Yes Lorayne Marek, MD  aspirin EC 81 MG tablet Take 81 mg by mouth every morning.   Yes Historical Provider, MD  brimonidine-timolol (COMBIGAN) 0.2-0.5 % ophthalmic solution Place 1 drop into both eyes 3 (three) times daily. Patient taking differently: Place 2 drops into both eyes 3 (three) times daily.  09/02/13  Yes Theodis Blaze, MD  carvedilol (COREG) 12.5 MG tablet Take 1 tablet (12.5 mg total) by mouth 2 (two) times daily with a meal. 02/15/14  Yes Deepak Advani, MD  ferrous sulfate 325 (65 FE) MG tablet Take 1 tablet (325 mg total) by mouth 2 (two) times daily with a meal. 04/30/14  Yes Deepak Advani, MD  furosemide (LASIX) 80 MG tablet Take 1 tablet (80 mg total) by mouth 2 (two) times daily. 04/30/14  Yes Lorayne Marek, MD  glimepiride (AMARYL) 1 MG tablet Take 1 tablet (1 mg total) by mouth daily before breakfast. 04/30/14  Yes Lorayne Marek, MD  hydrALAZINE (APRESOLINE) 25 MG tablet Take 1 tablet (25 mg total) by mouth every 8 (eight) hours. 04/30/14  Yes Lorayne Marek, MD  isosorbide mononitrate (IMDUR) 15 mg TB24 24 hr tablet Take 0.5 tablets (15 mg total) by mouth daily. 02/15/14  Yes Deepak Advani, MD  pravastatin (PRAVACHOL) 40 MG tablet Take 1 tablet (40  mg total) by mouth daily. 02/15/14  Yes Lorayne Marek, MD  glucose monitoring kit (FREESTYLE) monitoring kit 1 each by Does not apply route 4 (four) times daily - after meals and at bedtime. 1 month Diabetic Testing Supplies for QAC-QHS accuchecks. 04/30/14   Lorayne Marek, MD  trimethoprim-polymyxin b (POLYTRIM) ophthalmic solution Place 2 drops into both eyes daily. 05/17/14   Historical Provider, MD    Physical Exam: Filed Vitals:   06/18/14  1633 06/18/14 1900 06/18/14 1915  BP: 149/88 162/101 172/108  Pulse: 67 82 83  Temp: 98 F (36.7 C)    TempSrc: Oral    Resp: 16 22 20   Height: 5' 1"  (1.549 m)    Weight: 62.596 kg (138 lb)    SpO2: 100% 100% 100%    General: Alert, Awake and Oriented to Time, Place and Person. Appear in mild distress Eyes: PERRL ENT: Oral Mucosa clear moist. Neck: no JVD Cardiovascular: S1 and S2 Present, aortic systolic Murmur, Peripheral Pulses Present Respiratory: Bilateral Air entry equal and Decreased, Clear to Auscultation, noCrackles, no wheezes Abdomen: Bowel Sound present, Soft and non tender Skin: no Rash Extremities: Bilateral Pedal edema, no calf tenderness Neurologic: Grossly no focal neuro deficit.  Labs on Admission:  CBC:  Recent Labs Lab 06/18/14 1650  WBC 3.2*  NEUTROABS 1.2*  HGB 9.7*  HCT 29.8*  MCV 82.5  PLT 195    CMP     Component Value Date/Time   NA 136 06/18/2014 1650   K 4.2 06/18/2014 1650   CL 103 06/18/2014 1650   CO2 22 06/18/2014 1650   GLUCOSE 143* 06/18/2014 1650   BUN 61* 06/18/2014 1650   CREATININE 3.94* 06/18/2014 1650   CREATININE 3.24* 04/30/2014 1026   CALCIUM 8.8 06/18/2014 1650   PROT 5.9* 04/30/2014 1026   ALBUMIN 3.2* 04/30/2014 1026   AST 36 04/30/2014 1026   ALT 45* 04/30/2014 1026   ALKPHOS 156* 04/30/2014 1026   BILITOT 0.3 04/30/2014 1026   GFRNONAA 11* 06/18/2014 1650   GFRNONAA 14* 04/30/2014 1026   GFRAA 13* 06/18/2014 1650   GFRAA 16* 04/30/2014 1026    No results for input(s): LIPASE, AMYLASE in the last 168 hours.   Recent Labs Lab 06/18/14 1650  TROPONINI 0.06*   BNP (last 3 results)  Recent Labs  06/18/14 1650  BNP >4500.0*    ProBNP (last 3 results)  Recent Labs  08/16/13 1758 08/20/13 1020 10/21/13 0938  PROBNP 15783.0* 8385.0* 32323.0*     Radiological Exams on Admission: Dg Chest 2 View  06/18/2014   CLINICAL DATA:  Worsening bilateral lower extremity swelling. Combined systolic and  diastolic heart failure. Chronic kidney disease stage 4.  EXAM: CHEST  2 VIEW  COMPARISON:  10/24/2013  FINDINGS: Mild cardiomegaly is stable.  Small bilateral pleural effusions are seen, but decreased since previous study. Pulmonary vascular congestion and central perihilar interstitial prominence are suspicious for mild interstitial edema. No evidence of focal consolidation.  IMPRESSION: Cardiomegaly, small bilateral pleural effusions, and probable mild perihilar interstitial edema.   Electronically Signed   By: Earle Gell M.D.   On: 06/18/2014 17:33    EKG: Independently reviewed. normal sinus rhythm, nonspecific ST and T waves changes.  Assessment/Plan Principal Problem:   Acute on chronic combined systolic and diastolic heart failure Active Problems:   DM2 (diabetes mellitus, type 2)   Anemia   Acute on chronic renal insufficiency   Chronic kidney disease (CKD), stage IV (severe)   DCM (dilated cardiomyopathy)  1. Acute on chronic combined systolic and diastolic heart failure  Accelerated hypertension. Elevated troponin.  The patient is presenting with complaints of progressively worsening leg swelling and weight gain with worsening renal function despite taking regular Lasix as well as watching salt intake and fluid intake. Her serum creatinine is progressively worsening. Her blood pressure is also mildly elevated.  She has seen her cardiologist for the same problem as well as primary care physician. She scheduled follow-up with nephrology next month. Currently due to ongoing worsening of renal function she will be admitted in the hospital for close monitoring of renal function with diuresis as she has gained 10 pounds over last 2 weeks. Breasts and elevated blood pressure might be the cause for her consistent worsening heart failure. she has received 60 mg of IV Lasix and monitor her urine output as well as her BMP. We will adjust her blood pressure medications as well.  She  has elevated troponin which is most like is secondary to her chronic kidney disease as she does not have any chest pain or EKG changes. We'll monitor serial troponin. Continue aspirin.  2. accelerated hypertension. She is on Coreg 12.5 mg which I will continue, she is on hydralazine 25 mg which I will increase to 20 mg every 8 hours. She is on Imdur 15 mg which I will increase to 30 mg of Imdur. We will use IV hydralazine as needed for blood pressure.  3.progressively worsening chronic kidney disease. She is appearing to having progressively worsening chronic kidney disease possibly cardiorenal as well as combining hypertensive injury. Close monitoring of her BMP and avoid nephrotoxic medications.  4. diabetes mellitus type 2. Holding oral hypoglycemic agent and placing her on sliding scale currently.  5. dyslipidemia. Continue Pravachol.  6. Chronic anemia. No active bleeding reported.Continue iron supplements  Continue close monitoring  Advance goals of care discussion: full code  DVT Prophylaxis: subcutaneous Heparin Nutrition: nothing by mouth after midnight   Disposition: Admitted to inpatient in telemetry unit.  Author: Berle Mull, MD [facility] Pager: [phone] 06/18/2014, 8:06 PM    If 7PM-7AM, please contact night-coverage [email or web address] Password [email or web address]

## 2014-06-18 NOTE — ED Notes (Signed)
Called in dinner tray. 

## 2014-06-18 NOTE — ED Notes (Signed)
Dinner tray has arrived 

## 2014-06-18 NOTE — ED Notes (Signed)
Explained to patient staff will be measuring strict I and O's post lasix. She acknowledges and agrees to plan of care.

## 2014-06-18 NOTE — ED Provider Notes (Signed)
CSN: [id number]     Arrival date & time 06/18/14  1555 History   First MD Initiated Contact with Patient 06/18/14 1824     Chief Complaint  Patient presents with  . Leg Swelling     (Consider location/radiation/quality/duration/timing/severity/associated sxs/prior Treatment) HPI Comments: 64-year-old female with diabetes, high blood pressure, congestive heart failure, presents with worsening waking her leg swelling despite taking Lasix 80 mg twice daily. Patient has missed occasional doses. No significant [place] breath, no chest pain. Patient does have kidney disease is followed by specialist. Nothing improves her symptoms, no change in diet or salt intake.  The history is provided by the patient.    Past Medical History  Diagnosis Date  . Hypertension   . Diabetes mellitus   . Anemia   . Hyperlipidemia   . Left ventricular hypertrophy    Past Surgical History  Procedure Laterality Date  . Cholecystectomy     Family History  Problem Relation Age of Onset  . Diabetes type II    . Hypertension    . Diabetes Mother    History  Substance Use Topics  . Smoking status: Former Smoker    Types: Cigarettes    Quit date: 03/19/1966  . Smokeless tobacco: Never Used  . Alcohol Use: No   OB History    No data available     Review of Systems  Constitutional: Negative for fever and chills.  HENT: Negative for congestion.   Eyes: Negative for visual disturbance.  Respiratory: Negative for shortness of breath.   Cardiovascular: Positive for leg swelling. Negative for chest pain.  Gastrointestinal: Negative for vomiting and abdominal pain.  Genitourinary: Negative for dysuria and flank pain.  Musculoskeletal: Negative for back pain, neck pain and neck stiffness.  Skin: Negative for rash.  Neurological: Negative for light-headedness and headaches.      Allergies  Nitrofurantoin monohyd macro  Home Medications   Prior to Admission medications   Medication Sig Start Date  End Date Taking? Authorizing Provider  amLODipine (NORVASC) 10 MG tablet Take 1 tablet (10 mg total) by mouth every morning. 02/15/14  Yes Deepak Advani, MD  aspirin EC 81 MG tablet Take 81 mg by mouth every morning.   Yes Historical Provider, MD  brimonidine-timolol (COMBIGAN) 0.2-0.5 % ophthalmic solution Place 1 drop into both eyes 3 (three) times daily. Patient taking differently: Place 2 drops into both eyes 3 (three) times daily.  09/02/13  Yes Iskra M Myers, MD  carvedilol (COREG) 12.5 MG tablet Take 1 tablet (12.5 mg total) by mouth 2 (two) times daily with a meal. 02/15/14  Yes Deepak Advani, MD  ferrous sulfate 325 (65 FE) MG tablet Take 1 tablet (325 mg total) by mouth 2 (two) times daily with a meal. 04/30/14  Yes Deepak Advani, MD  furosemide (LASIX) 80 MG tablet Take 1 tablet (80 mg total) by mouth 2 (two) times daily. 04/30/14  Yes Deepak Advani, MD  glimepiride (AMARYL) 1 MG tablet Take 1 tablet (1 mg total) by mouth daily before breakfast. 04/30/14  Yes Deepak Advani, MD  hydrALAZINE (APRESOLINE) 25 MG tablet Take 1 tablet (25 mg total) by mouth every 8 (eight) hours. 04/30/14  Yes Deepak Advani, MD  isosorbide mononitrate (IMDUR) 15 mg TB24 24 hr tablet Take 0.5 tablets (15 mg total) by mouth daily. 02/15/14  Yes Deepak Advani, MD  pravastatin (PRAVACHOL) 40 MG tablet Take 1 tablet (40 mg total) by mouth daily. 02/15/14  Yes Deepak Advani, MD  glucose monitoring kit (  FREESTYLE) monitoring kit 1 each by Does not apply route 4 (four) times daily - after meals and at bedtime. 1 month Diabetic Testing Supplies for QAC-QHS accuchecks. 04/30/14   Lorayne Marek, MD  trimethoprim-polymyxin b (POLYTRIM) ophthalmic solution Place 2 drops into both eyes daily. 05/17/14   Historical Provider, MD   BP 162/101 mmHg  Pulse 82  Temp(Src) 98 F (36.7 C) (Oral)  Resp 22  Ht 5' 1" (1.549 m)  Wt 138 lb (62.596 kg)  BMI 26.09 kg/m2  SpO2 100% Physical Exam  Constitutional: She is oriented to person,  place, and time. She appears well-developed and well-nourished.  HENT:  Head: Normocephalic and atraumatic.  Eyes: Conjunctivae are normal. Right eye exhibits no discharge. Left eye exhibits no discharge.  Neck: Normal range of motion. Neck supple. No tracheal deviation present.  Cardiovascular: Normal rate and regular rhythm.   Pulmonary/Chest: Effort normal. She has rales (few crackles at bases no respiratory difficulty).  Abdominal: Soft. She exhibits no distension. There is no tenderness. There is no guarding.  Musculoskeletal: She exhibits edema.  Neurological: She is alert and oriented to person, place, and time.  Skin: Skin is warm. No rash noted.  2+ edema bilateral lower extremities  Psychiatric: She has a normal mood and affect.  Nursing note and vitals reviewed.   ED Course  Procedures (including critical care time) Labs Review Labs Reviewed  BASIC METABOLIC PANEL - Abnormal; Notable for the following:    Glucose, Bld 143 (*)    BUN 61 (*)    Creatinine, Ser 3.94 (*)    GFR calc non Af Amer 11 (*)    GFR calc Af Amer 13 (*)    All other components within normal limits  BRAIN NATRIURETIC PEPTIDE - Abnormal; Notable for the following:    B Natriuretic Peptide >4500.0 (*)    All other components within normal limits  CBC WITH DIFFERENTIAL/PLATELET - Abnormal; Notable for the following:    WBC 3.2 (*)    RBC 3.61 (*)    Hemoglobin 9.7 (*)    HCT 29.8 (*)    Neutrophils Relative % 37 (*)    Neutro Abs 1.2 (*)    Monocytes Relative 17 (*)    All other components within normal limits  TROPONIN I - Abnormal; Notable for the following:    Troponin I 0.06 (*)    All other components within normal limits    Imaging Review Dg Chest 2 View  06/18/2014   CLINICAL DATA:  Worsening bilateral lower extremity swelling. Combined systolic and diastolic heart failure. Chronic kidney disease stage 4.  EXAM: CHEST  2 VIEW  COMPARISON:  10/24/2013  FINDINGS: Mild cardiomegaly is  stable.  Small bilateral pleural effusions are seen, but decreased since previous study. Pulmonary vascular congestion and central perihilar interstitial prominence are suspicious for mild interstitial edema. No evidence of focal consolidation.  IMPRESSION: Cardiomegaly, small bilateral pleural effusions, and probable mild perihilar interstitial edema.   Electronically Signed   By: Earle Gell M.D.   On: 06/18/2014 17:33    Date/Time:  Friday June 18 2014 16:38:09 EDT Ventricular Rate:  82 PR Interval:  146 QRS Duration: 132 QT Interval:  418 QTC Calculation: 488 R Axis:   -51 Text Interpretation:  Normal sinus rhythm Right bundle branch block Left  anterior fascicular block Bifascicular block Left ventricular  hypertrophy with repolarization abnormality Abnormal ECG Confirmed by  Suan Pyeatt  MD, Roston Grunewald ([id number]) on 06/18/2014 6:45:58 PM  MDM   Final diagnoses:  Acute systolic congestive heart failure  Weight gain  Acute renal failure, unspecified acute renal failure type  Bilateral leg edema   Patient presents with weight gain, leg swelling, worsening kidney function and known heart failure. Patient usually compliant with meds, likely worsening heart failure and weight gain due to kidney disease. IV Lasix ordered in ER. Patient admitted to tocometry for diuresis and further treatment.  The patients results and plan were reviewed and discussed.   Any x-rays performed were personally reviewed by myself.   Differential diagnosis were considered with the presenting HPI.  Medications  furosemide (LASIX) injection 60 mg (not administered)    Filed Vitals:   06/18/14 1633 06/18/14 1900  BP: 149/88 162/101  Pulse: 67 82  Temp: 98 F (36.7 C)   TempSrc: Oral   Resp: 16 22  Height: 5' 1" (1.549 m)   Weight: 138 lb (62.596 kg)   SpO2: 100% 100%    Final diagnoses:  Acute systolic congestive heart failure  Weight gain  Acute renal failure, unspecified acute renal failure type   Bilateral leg edema  Troponin elevation  Admission/ observation were discussed with the admitting physician, patient and/or family and they are comfortable with the plan.     Joshua Zavitz, MD 06/18/14 1922 

## 2014-06-18 NOTE — ED Notes (Signed)
The pts feet and ankles have been swollen since last week.  No pain anywhere.  No sob .  She had this last year and she was diagnosed with chf

## 2014-06-18 NOTE — ED Notes (Signed)
Attempted to call report

## 2014-06-19 LAB — CBC WITH DIFFERENTIAL/PLATELET
BASOS ABS: 0 10*3/uL (ref 0.0–0.1)
Basophils Relative: 0 % (ref 0–1)
Eosinophils Absolute: 0.1 10*3/uL (ref 0.0–0.7)
Eosinophils Relative: 4 % (ref 0–5)
HCT: 31.1 % — ABNORMAL LOW (ref 36.0–46.0)
Hemoglobin: 10 g/dL — ABNORMAL LOW (ref 12.0–15.0)
Lymphocytes Relative: 46 % (ref 12–46)
Lymphs Abs: 1.5 10*3/uL (ref 0.7–4.0)
MCH: 26.8 pg (ref 26.0–34.0)
MCHC: 32.2 g/dL (ref 30.0–36.0)
MCV: 83.4 fL (ref 78.0–100.0)
MONOS PCT: 15 % — AB (ref 3–12)
Monocytes Absolute: 0.5 10*3/uL (ref 0.1–1.0)
NEUTROS ABS: 1.1 10*3/uL — AB (ref 1.7–7.7)
Neutrophils Relative %: 35 % — ABNORMAL LOW (ref 43–77)
Platelets: 200 10*3/uL (ref 150–400)
RBC: 3.73 MIL/uL — ABNORMAL LOW (ref 3.87–5.11)
RDW: 14.8 % (ref 11.5–15.5)
WBC: 3.3 10*3/uL — ABNORMAL LOW (ref 4.0–10.5)

## 2014-06-19 LAB — GLUCOSE, CAPILLARY
GLUCOSE-CAPILLARY: 121 mg/dL — AB (ref 70–99)
Glucose-Capillary: 144 mg/dL — ABNORMAL HIGH (ref 70–99)
Glucose-Capillary: 131 mg/dL — ABNORMAL HIGH (ref 70–99)
Glucose-Capillary: 139 mg/dL — ABNORMAL HIGH (ref 70–99)

## 2014-06-19 LAB — COMPREHENSIVE METABOLIC PANEL
ALK PHOS: 140 U/L — AB (ref 39–117)
ALT: 24 U/L (ref 0–35)
AST: 21 U/L (ref 0–37)
Albumin: 2.7 g/dL — ABNORMAL LOW (ref 3.5–5.2)
Anion gap: 6 (ref 5–15)
BUN: 59 mg/dL — ABNORMAL HIGH (ref 6–23)
CALCIUM: 8.7 mg/dL (ref 8.4–10.5)
CHLORIDE: 105 mmol/L (ref 96–112)
CO2: 26 mmol/L (ref 19–32)
Creatinine, Ser: 3.93 mg/dL — ABNORMAL HIGH (ref 0.50–1.10)
GFR calc Af Amer: 13 mL/min — ABNORMAL LOW (ref >90)
GFR calc non Af Amer: 11 mL/min — ABNORMAL LOW (ref >90)
GLUCOSE: 128 mg/dL — AB (ref 70–99)
Potassium: 4.2 mmol/L (ref 3.5–5.1)
Sodium: 137 mmol/L (ref 135–145)
TOTAL PROTEIN: 5.8 g/dL — AB (ref 6.0–8.3)
Total Bilirubin: 0.4 mg/dL (ref 0.3–1.2)

## 2014-06-19 LAB — PROTIME-INR
INR: 1.13 (ref 0.00–1.49)
Prothrombin Time: 14.7 seconds (ref 11.6–15.2)

## 2014-06-19 LAB — TROPONIN I
Troponin I: 0.06 ng/mL — ABNORMAL HIGH (ref <0.031)
Troponin I: 0.06 ng/mL — ABNORMAL HIGH (ref <0.031)

## 2014-06-19 MED ORDER — TIMOLOL MALEATE 0.5 % OP SOLN
1.0000 [drp] | Freq: Three times a day (TID) | OPHTHALMIC | Status: DC
  Administered 2014-06-19 – 2014-06-21 (×5): 1 [drp] via OPHTHALMIC
  Filled 2014-06-19: qty 5

## 2014-06-19 MED ORDER — ISOSORBIDE MONONITRATE ER 60 MG PO TB24
60.0000 mg | ORAL_TABLET | Freq: Every day | ORAL | Status: DC
  Administered 2014-06-19: 60 mg via ORAL
  Filled 2014-06-19 (×2): qty 1

## 2014-06-19 MED ORDER — BRIMONIDINE TARTRATE 0.2 % OP SOLN
1.0000 [drp] | Freq: Three times a day (TID) | OPHTHALMIC | Status: DC
  Administered 2014-06-19 – 2014-06-21 (×5): 1 [drp] via OPHTHALMIC
  Filled 2014-06-19: qty 5

## 2014-06-19 MED ORDER — FUROSEMIDE 10 MG/ML IJ SOLN
120.0000 mg | Freq: Two times a day (BID) | INTRAVENOUS | Status: DC
  Administered 2014-06-19 (×2): 120 mg via INTRAVENOUS
  Filled 2014-06-19 (×4): qty 12

## 2014-06-19 MED ORDER — POLYMYXIN B-TRIMETHOPRIM 10000-0.1 UNIT/ML-% OP SOLN
2.0000 [drp] | Freq: Every day | OPHTHALMIC | Status: DC
  Administered 2014-06-22: 2 [drp] via OPHTHALMIC
  Filled 2014-06-19: qty 10

## 2014-06-19 MED ORDER — BRIMONIDINE TARTRATE-TIMOLOL 0.2-0.5 % OP SOLN: 1.0000 [drp] | Freq: Three times a day (TID) | OPHTHALMIC | Status: DC

## 2014-06-19 NOTE — Progress Notes (Signed)
[facility] PROGRESS NOTE  Filed Weights   06/18/14 1633 06/18/14 2017 06/19/14 2197  Weight: 62.596 kg (138 lb) 60.1 kg (132 lb 7.9 oz) 59.557 kg (131 lb 4.8 oz)        Intake/Output Summary (Last 24 hours) at 06/19/14 5883 Last data filed at 06/19/14 0600  Gross per 24 hour  Intake    480 ml  Output    600 ml  Net   -120 ml     Assessment/Plan: Acute on chronic combined systolic and diastolic heart failure - Her home dose of Lasix, she has lower extremity edema and positive hepatojugular reflux. - Patient on a renal diet fluid restrict her strict I's and O's - Monitor kidney function and potassium.  Chronic kidney disease (CKD), stage IV (severe) - Likely due to uncontrolled hypertension and diabetes mellitus. - Baseline creatinine around 3.2. We'll try to diuresis.  DM2 (diabetes mellitus, type 2) - Blood glucose well controlled continue current therapy. Renal diet.  Anemia of chronic disease: - Currently at baseline.     Code Status: full Family Communication: none  Disposition Plan: inpatient   Consultants:  none  Procedures: ECHO: none  Antibiotics:  None  HPI/Subjective: No complaints related her shortness of breath is better.  Objective: Filed Vitals:   06/18/14 1915 06/18/14 2017 06/19/14 0150 06/19/14 0632  BP: 172/108 168/101 160/94 142/68  Pulse: 83 86 78 72  Temp:  97.9 F (36.6 C) 97.9 F (36.6 C) 98.1 F (36.7 C)  TempSrc:  Oral Oral Oral  Resp: 20 20 20 20   Height:  5\' 1"  (1.549 m)    Weight:  60.1 kg (132 lb 7.9 oz)  59.557 kg (131 lb 4.8 oz)  SpO2: 100% 95% 96% 100%     Exam:  General: Alert, awake, oriented x3, in no acute distress.  HEENT: No bruits, no goiter. Positive hepatojugular reflux. Heart: Regular rate and rhythm. Lungs: Good air movement, clear Abdomen: Soft, nontender, nondistended, positive bowel sounds.  Neuro: Grossly intact, nonfocal.   Data Reviewed: Basic Metabolic Panel:  Recent  Labs Lab 06/18/14 1650 06/19/14 0226  NA 136 137  K 4.2 4.2  CL 103 105  CO2 22 26  GLUCOSE 143* 128*  BUN 61* 59*  CREATININE 3.94* 3.93*  CALCIUM 8.8 8.7   Liver Function Tests:  Recent Labs Lab 06/19/14 0226  AST 21  ALT 24  ALKPHOS 140*  BILITOT 0.4  PROT 5.8*  ALBUMIN 2.7*   No results for input(s): LIPASE, AMYLASE in the last 168 hours. No results for input(s): AMMONIA in the last 168 hours. CBC:  Recent Labs Lab 06/18/14 1650 06/19/14 0226  WBC 3.2* 3.3*  NEUTROABS 1.2* 1.1*  HGB 9.7* 10.0*  HCT 29.8* 31.1*  MCV 82.5 83.4  PLT 195 200   Cardiac Enzymes:  Recent Labs Lab 06/18/14 1650 06/18/14 2113 06/19/14 0226  CKTOTAL  --  172  --   CKMB  --  4.4*  --   TROPONINI 0.06* 0.06* 0.06*   BNP (last 3 results)  Recent Labs  06/18/14 1650  BNP >4500.0*    ProBNP (last 3 results)  Recent Labs  08/16/13 1758 08/20/13 1020 10/21/13 0938  PROBNP 15783.0* 8385.0* 32323.0*    CBG:  Recent Labs Lab 06/18/14 2120 06/19/14 0635  GLUCAP 117* 121*    No results found for this or any previous visit (from the past 240 hour(s)).   Studies: Dg Chest 2 View  06/18/2014   CLINICAL DATA:  Worsening bilateral lower extremity swelling. Combined systolic and diastolic heart failure. Chronic kidney disease stage 4.  EXAM: CHEST  2 VIEW  COMPARISON:  10/24/2013  FINDINGS: Mild cardiomegaly is stable.  Small bilateral pleural effusions are seen, but decreased since previous study. Pulmonary vascular congestion and central perihilar interstitial prominence are suspicious for mild interstitial edema. No evidence of focal consolidation.  IMPRESSION: Cardiomegaly, small bilateral pleural effusions, and probable mild perihilar interstitial edema.   Electronically Signed   By: Myles Rosenthal M.D.   On: 06/18/2014 17:33    Scheduled Meds: . amLODipine  10 mg Oral q morning - 10a  . aspirin EC  81 mg Oral q morning - 10a  . carvedilol  12.5 mg Oral BID WC  .  ferrous sulfate  325 mg Oral BID WC  . heparin  5,000 Units Subcutaneous 3 times per day  . hydrALAZINE  50 mg Oral 3 times per day  . insulin aspart  0-15 Units Subcutaneous TID WC  . insulin aspart  0-5 Units Subcutaneous QHS  . isosorbide mononitrate  30 mg Oral Daily  . pravastatin  40 mg Oral q1800  . sodium chloride  3 mL Intravenous Q12H   Continuous Infusions:    Marinda Elk  [facility] Pager [phone] If 7PM-7AM, please contact night-coverage at [email or web address], password [email or web address] 06/19/2014, 9:52 AM  LOS: 1 day

## 2014-06-19 NOTE — Progress Notes (Signed)
Patient alert oriented , patient denies pain, shortness of breath.BP elevated , pt. On bp medications, SR on the monitor . Will continue to monitor.

## 2014-06-20 DIAGNOSIS — I5033 Acute on chronic diastolic (congestive) heart failure: Secondary | ICD-10-CM

## 2014-06-20 DIAGNOSIS — E1121 Type 2 diabetes mellitus with diabetic nephropathy: Secondary | ICD-10-CM | POA: Diagnosis present

## 2014-06-20 DIAGNOSIS — E1129 Type 2 diabetes mellitus with other diabetic kidney complication: Secondary | ICD-10-CM

## 2014-06-20 LAB — GLUCOSE, CAPILLARY
Glucose-Capillary: 98 mg/dL (ref 70–99)
Glucose-Capillary: 136 mg/dL — ABNORMAL HIGH (ref 70–99)
Glucose-Capillary: 150 mg/dL — ABNORMAL HIGH (ref 70–99)
Glucose-Capillary: 120 mg/dL — ABNORMAL HIGH (ref 70–99)
Glucose-Capillary: 133 mg/dL — ABNORMAL HIGH (ref 70–99)

## 2014-06-20 LAB — RENAL FUNCTION PANEL
ALBUMIN: 2.5 g/dL — AB (ref 3.5–5.2)
ANION GAP: 10 (ref 5–15)
BUN: 59 mg/dL — AB (ref 6–23)
CALCIUM: 8.5 mg/dL (ref 8.4–10.5)
CHLORIDE: 105 mmol/L (ref 96–112)
CO2: 23 mmol/L (ref 19–32)
Creatinine, Ser: 3.76 mg/dL — ABNORMAL HIGH (ref 0.50–1.10)
GFR calc non Af Amer: 12 mL/min — ABNORMAL LOW (ref >90)
GFR, EST AFRICAN AMERICAN: 13 mL/min — AB (ref >90)
Glucose, Bld: 104 mg/dL — ABNORMAL HIGH (ref 70–99)
POTASSIUM: 3.9 mmol/L (ref 3.5–5.1)
Phosphorus: 4.4 mg/dL (ref 2.3–4.6)
Sodium: 138 mmol/L (ref 135–145)

## 2014-06-20 MED ORDER — METOLAZONE 2.5 MG PO TABS
2.5000 mg | ORAL_TABLET | Freq: Two times a day (BID) | ORAL | Status: DC
  Administered 2014-06-20 – 2014-06-22 (×5): 2.5 mg via ORAL
  Filled 2014-06-20 (×6): qty 1

## 2014-06-20 MED ORDER — FUROSEMIDE 10 MG/ML IJ SOLN
160.0000 mg | Freq: Three times a day (TID) | INTRAVENOUS | Status: DC
  Administered 2014-06-20 – 2014-06-21 (×3): 160 mg via INTRAVENOUS
  Filled 2014-06-20 (×5): qty 16

## 2014-06-20 MED ORDER — ISOSORBIDE MONONITRATE ER 60 MG PO TB24
120.0000 mg | ORAL_TABLET | Freq: Every day | ORAL | Status: DC
  Administered 2014-06-20: 120 mg via ORAL
  Administered 2014-06-21: 60 mg via ORAL
  Filled 2014-06-20 (×3): qty 2

## 2014-06-20 MED ORDER — FUROSEMIDE 10 MG/ML IJ SOLN: 120.0000 mg | Freq: Three times a day (TID) | INTRAVENOUS | Status: DC

## 2014-06-20 NOTE — Progress Notes (Signed)
[facility] PROGRESS NOTE  Filed Weights   06/18/14 2017 06/19/14 0632 06/20/14 0529  Weight: 60.1 kg (132 lb 7.9 oz) 59.557 kg (131 lb 4.8 oz) 58.695 kg (129 lb 6.4 oz)        Intake/Output Summary (Last 24 hours) at 06/20/14 0903 Last data filed at 06/20/14 0801  Gross per 24 hour  Intake   1080 ml  Output   1900 ml  Net   -820 ml     Assessment/Plan: Acute on chronic combined systolic and diastolic heart failure: - Increase Lasix and add metolazone low dose. she has lower extremity edema and positive hepatojugular reflux. - Patient on a renal diet fluid restrict her strict I's and O's - Monitor kidney function and potassium. - Elevation of troponins is likely due to chronic renal disease.  Chronic kidney disease (CKD), stage IV (severe) - Likely due to uncontrolled hypertension and diabetes mellitus. - Baseline creatinine around 3.2. We'll try to diuresis. - She has a follow-up appointment with Dr. [name] on 07/23/2014 to discuss renal replacement therapy. - Her calcium is 8.5 and she is not acidotic.  DM2 (diabetes mellitus, type 2) - Blood glucose well controlled continue current therapy. Renal diet.  Anemia of chronic disease: - Currently at baseline. - Due to chronic renal disease.    Code Status: full Family Communication: none  Disposition Plan: inpatient   Consultants:  none  Procedures: ECHO: none  Antibiotics:  None  HPI/Subjective: No complaints related her shortness of breath is better. She would like to go home by 06/21/2014.  Objective: Filed Vitals:   06/19/14 1745 06/19/14 2032 06/20/14 0053 06/20/14 0529  BP: 115/73 126/71 141/72 146/83  Pulse: 67 73 68 70  Temp:  97.5 F (36.4 C) 97.8 F (36.6 C) 98 F (36.7 C)  TempSrc:  Oral Oral Oral  Resp:  20 20 20   Height:      Weight:    58.695 kg (129 lb 6.4 oz)  SpO2:  96% 95% 98%     Exam:  General: Alert, awake, oriented x3, in no acute distress.  HEENT: No  bruits, no goiter. Positive hepatojugular reflux. Heart: Regular rate and rhythm. Trace lower to mid edema. Lungs: Good air movement, clear Abdomen: Soft, nontender, nondistended, positive bowel sounds.  Neuro: Grossly intact, nonfocal.   Data Reviewed: Basic Metabolic Panel:  Recent Labs Lab 06/18/14 1650 06/19/14 0226 06/20/14 0525  NA 136 137 138  K 4.2 4.2 3.9  CL 103 105 105  CO2 22 26 23   GLUCOSE 143* 128* 104*  BUN 61* 59* 59*  CREATININE 3.94* 3.93* 3.76*  CALCIUM 8.8 8.7 8.5  PHOS  --   --  4.4   Liver Function Tests:  Recent Labs Lab 06/19/14 0226 06/20/14 0525  AST 21  --   ALT 24  --   ALKPHOS 140*  --   BILITOT 0.4  --   PROT 5.8*  --   ALBUMIN 2.7* 2.5*   No results for input(s): LIPASE, AMYLASE in the last 168 hours. No results for input(s): AMMONIA in the last 168 hours. CBC:  Recent Labs Lab 06/18/14 1650 06/19/14 0226  WBC 3.2* 3.3*  NEUTROABS 1.2* 1.1*  HGB 9.7* 10.0*  HCT 29.8* 31.1*  MCV 82.5 83.4  PLT 195 200   Cardiac Enzymes:  Recent Labs Lab 06/18/14 1650 06/18/14 2113 06/19/14 0226 06/19/14 0830  CKTOTAL  --  172  --   --   CKMB  --  4.4*  --   --   TROPONINI 0.06* 0.06* 0.06* 0.06*   BNP (last 3 results)  Recent Labs  06/18/14 1650  BNP >4500.0*    ProBNP (last 3 results)  Recent Labs  08/16/13 1758 08/20/13 1020 10/21/13 0938  PROBNP 15783.0* 8385.0* 32323.0*    CBG:  Recent Labs Lab 06/19/14 0635 06/19/14 1133 06/19/14 1630 06/19/14 2116 06/20/14 0634  GLUCAP 121* 144* 131* 139* 98    No results found for this or any previous visit (from the past 240 hour(s)).   Studies: Dg Chest 2 View  06/18/2014   CLINICAL DATA:  Worsening bilateral lower extremity swelling. Combined systolic and diastolic heart failure. Chronic kidney disease stage 4.  EXAM: CHEST  2 VIEW  COMPARISON:  10/24/2013  FINDINGS: Mild cardiomegaly is stable.  Small bilateral pleural effusions are seen, but decreased since  previous study. Pulmonary vascular congestion and central perihilar interstitial prominence are suspicious for mild interstitial edema. No evidence of focal consolidation.  IMPRESSION: Cardiomegaly, small bilateral pleural effusions, and probable mild perihilar interstitial edema.   Electronically Signed   By: Myles Rosenthal M.D.   On: 06/18/2014 17:33    Scheduled Meds: . amLODipine  10 mg Oral q morning - 10a  . aspirin EC  81 mg Oral q morning - 10a  . brimonidine  1 drop Both Eyes TID   And  . timolol  1 drop Both Eyes TID  . carvedilol  12.5 mg Oral BID WC  . ferrous sulfate  325 mg Oral BID WC  . furosemide  120 mg Intravenous Q12H  . heparin  5,000 Units Subcutaneous 3 times per day  . hydrALAZINE  50 mg Oral 3 times per day  . insulin aspart  0-15 Units Subcutaneous TID WC  . insulin aspart  0-5 Units Subcutaneous QHS  . isosorbide mononitrate  60 mg Oral Daily  . pravastatin  40 mg Oral q1800  . sodium chloride  3 mL Intravenous Q12H  . trimethoprim-polymyxin b  2 drop Both Eyes Daily   Continuous Infusions:    Marinda Elk  [facility] Pager [phone] If 7PM-7AM, please contact night-coverage at [email or web address], password [facility] 06/20/2014, 9:03 AM  LOS: 2 days

## 2014-06-20 NOTE — Progress Notes (Signed)
UR Completed.  [phone]  

## 2014-06-20 NOTE — Progress Notes (Signed)
Patient alert oriented, denies pain, no shortness of breath, v/s stable, SR on the monitor. Will continue to monitor patient.

## 2014-06-21 DIAGNOSIS — E1121 Type 2 diabetes mellitus with diabetic nephropathy: Secondary | ICD-10-CM

## 2014-06-21 LAB — RENAL FUNCTION PANEL
ANION GAP: 8 (ref 5–15)
Albumin: 2.5 g/dL — ABNORMAL LOW (ref 3.5–5.2)
BUN: 60 mg/dL — AB (ref 6–23)
CO2: 27 mmol/L (ref 19–32)
Calcium: 8.5 mg/dL (ref 8.4–10.5)
Chloride: 103 mmol/L (ref 96–112)
Creatinine, Ser: 3.9 mg/dL — ABNORMAL HIGH (ref 0.50–1.10)
GFR calc non Af Amer: 11 mL/min — ABNORMAL LOW (ref >90)
GFR, EST AFRICAN AMERICAN: 13 mL/min — AB (ref >90)
GLUCOSE: 152 mg/dL — AB (ref 70–99)
PHOSPHORUS: 4.1 mg/dL (ref 2.3–4.6)
POTASSIUM: 3.7 mmol/L (ref 3.5–5.1)
SODIUM: 138 mmol/L (ref 135–145)

## 2014-06-21 LAB — HEMOGLOBIN A1C
Hgb A1c MFr Bld: 6.8 % — ABNORMAL HIGH (ref 4.8–5.6)
Hgb A1c MFr Bld: 6.9 % — ABNORMAL HIGH (ref 4.8–5.6)
Mean Plasma Glucose: 148 mg/dL
Mean Plasma Glucose: 151 mg/dL

## 2014-06-21 LAB — GLUCOSE, CAPILLARY
GLUCOSE-CAPILLARY: 120 mg/dL — AB (ref 70–99)
Glucose-Capillary: 159 mg/dL — ABNORMAL HIGH (ref 70–99)
Glucose-Capillary: 124 mg/dL — ABNORMAL HIGH (ref 70–99)
Glucose-Capillary: 167 mg/dL — ABNORMAL HIGH (ref 70–99)

## 2014-06-21 MED ORDER — FERROUS SULFATE 325 (65 FE) MG PO TABS
325.0000 mg | ORAL_TABLET | Freq: Two times a day (BID) | ORAL | Status: DC
  Administered 2014-06-21 – 2014-06-22 (×3): 325 mg via ORAL
  Filled 2014-06-21 (×5): qty 1

## 2014-06-21 MED ORDER — HYDRALAZINE HCL 50 MG PO TABS
100.0000 mg | ORAL_TABLET | Freq: Three times a day (TID) | ORAL | Status: DC
  Administered 2014-06-21 – 2014-06-22 (×2): 100 mg via ORAL
  Filled 2014-06-21 (×5): qty 2

## 2014-06-21 MED ORDER — CARVEDILOL 12.5 MG PO TABS
12.5000 mg | ORAL_TABLET | Freq: Two times a day (BID) | ORAL | Status: DC
  Administered 2014-06-21 – 2014-06-22 (×3): 12.5 mg via ORAL
  Filled 2014-06-21 (×5): qty 1

## 2014-06-21 MED ORDER — FUROSEMIDE 10 MG/ML IJ SOLN
80.0000 mg | Freq: Two times a day (BID) | INTRAMUSCULAR | Status: DC
  Administered 2014-06-21 (×2): 80 mg via INTRAVENOUS
  Filled 2014-06-21 (×4): qty 8

## 2014-06-21 NOTE — Care Management Note (Unsigned)
    Page 1 of 1   06/21/2014     3:52:10 PM CARE MANAGEMENT NOTE 06/21/2014  Patient:  [name], [name]   Account Number:  [id number]  Date Initiated:  06/21/2014  Documentation initiated by:  Marquavis Hannen  Subjective/Objective Assessment:   Pt adm on 06/18/14 with acute on chronic CHF.  PTA, pt independent, lives with spouse.     Action/Plan:   Will follow for dc needs as pt progresses.   Anticipated DC Date:  06/23/2014   Anticipated DC Plan:  HOME/SELF CARE      DC Planning Services  CM consult      Choice offered to / List presented to:             Status of service:  In process, will continue to follow Medicare Important Message given?  YES (If response is "NO", the following Medicare IM given date fields will be blank) Date Medicare IM given:  06/21/2014 Medicare IM given by:  Samika Vetsch Date Additional Medicare IM given:   Additional Medicare IM given by:    Discharge Disposition:    Per UR Regulation:  Reviewed for med. necessity/level of care/duration of stay  If discussed at Long Length of Stay Meetings, dates discussed:    Comments:

## 2014-06-21 NOTE — Progress Notes (Signed)
[facility] PROGRESS NOTE  [name] UJW:[id number] DOB: 04/25/1950 DOA: 06/18/2014 PCP: Doris Cheadle, MD   Brief narrative 64 year old female with chronic, and systolic and diastolic CHF, hypertension, type 2 diabetes mellitus, chronic kidney disease and chronic anemia presented with progressive weight gain and leg swellings with 10 pound weight gain over past 2 weeks despite being compliant with her salt intake and Lasix. Patient admitted for acute on chronic systolic and diastolic CHF.  Assessment/Plan: Acute on chronic, systolic and diastolic CHF Patient placed on Lasix drip. metolazone dose increased. Diuresed well since admission. Net negative of 2.6 L since admission. Symptoms much better but still has some bilateral leg edema. Will switch to IV Lasix 80 mg twice daily. Replenish potassium. EF of 45 and 50% with grade 1 diastolic dysfunction as per echo from June 2015. Continue Coreg, aspirin and statin.  Elevated troponin Likely related to CHF and underlying chronic kidney disease. Patient does not have any chest pain symptoms and stable on telemetry  Chronic kidney disease stage IV Likely due to underlying diabetes mellitus and uncontrolled hypertension. Baseline creatinine is around 3.2. Monitor with diuresis. Has follow-up with her nephrologist Dr. Marisue Humble on 4/6  Essential hypertension Dose of metolazone, Imdur and hydralazine increased. Amlodipine discontinued due to leg edema.  Type 2 diabetes mellitus Stable. Not on any medications. Monitor on his side   Anemia of chronic kidney disease/and deficiency Stable.  Continue iron Supplement  Diet: Diabetic/heart healthy  DVT prophylaxis: Subcutaneous heparin  Code Status: Full code Family Communication: none At bedside  Disposition Plan: home tomorrow if continues to diuresis well   Consultants:  None  Procedures:  None  Antibiotics:  None  HPI/Subjective: pt seen and examined. Denies SOB,   leg swellings improving.  Objective: Filed Vitals:   06/21/14 1414  BP: 124/66  Pulse: 63  Temp: 98 F (36.7 C)  Resp: 18    Intake/Output Summary (Last 24 hours) at 06/21/14 1630 Last data filed at 06/21/14 1415  Gross per 24 hour  Intake   1498 ml  Output   3400 ml  Net  -1902 ml   Filed Weights   06/19/14 0632 06/20/14 0529 06/21/14 0647  Weight: 59.557 kg (131 lb 4.8 oz) 58.695 kg (129 lb 6.4 oz) 57.108 kg (125 lb 14.4 oz)    Exam:   General:  Elderly female in no acute distress  HEENT: No pallor, moist oral mucosa, no JVD  Chest: Fine bibasilar crackles, no rhonchi or wheeze  CVS: Normal S1 and S2, no murmurs rub or gallop  GI: Soft, nondistended, nontender, bowel sounds present  Musculoskeletal: Warm, 2+ pitting edema bilaterally  CNS: Alert and oriented    Data Reviewed: Basic Metabolic Panel:  Recent Labs Lab 06/18/14 1650 06/19/14 0226 06/20/14 0525 06/21/14 0525  NA 136 137 138 138  K 4.2 4.2 3.9 3.7  CL 103 105 105 103  CO2 GLUCOSE 143* 128* 104* 152*  BUN 61* 59* 59* 60*  CREATININE 3.94* 3.93* 3.76* 3.90*  CALCIUM 8.8 8.7 8.5 8.5  PHOS  --   --  4.4 4.1   Liver Function Tests:  Recent Labs Lab 06/19/14 0226 06/20/14 0525 06/21/14 0525  AST 21  --   --   ALT 24  --   --   ALKPHOS 140*  --   --   BILITOT 0.4  --   --   PROT 5.8*  --   --   ALBUMIN 2.7* 2.5*  2.5*   No results for input(s): LIPASE, AMYLASE in the last 168 hours. No results for input(s): AMMONIA in the last 168 hours. CBC:  Recent Labs Lab 06/18/14 1650 06/19/14 0226  WBC 3.2* 3.3*  NEUTROABS 1.2* 1.1*  HGB 9.7* 10.0*  HCT 29.8* 31.1*  MCV 82.5 83.4  PLT 195 200   Cardiac Enzymes:  Recent Labs Lab 06/18/14 1650 06/18/14 2113 06/19/14 0226 06/19/14 0830  CKTOTAL  --  172  --   --   CKMB  --  4.4*  --   --   TROPONINI 0.06* 0.06* 0.06* 0.06*   BNP (last 3 results)  Recent Labs  06/18/14 1650  BNP >4500.0*    ProBNP (last  3 results)  Recent Labs  08/16/13 1758 08/20/13 1020 10/21/13 0938  PROBNP 15783.0* 8385.0* 32323.0*    CBG:  Recent Labs Lab 06/20/14 1227 06/20/14 1634 06/20/14 2113 06/21/14 0546 06/21/14 1133  GLUCAP 136* 120* 133* 120* 159*    No results found for this or any previous visit (from the past 240 hour(s)).   Studies: No results found.  Scheduled Meds: . amLODipine  10 mg Oral q morning - 10a  . aspirin EC  81 mg Oral q morning - 10a  . brimonidine  1 drop Both Eyes TID   And  . timolol  1 drop Both Eyes TID  . carvedilol  12.5 mg Oral BID WC  . ferrous sulfate  325 mg Oral BID WC  . furosemide  80 mg Intravenous BID  . heparin  5,000 Units Subcutaneous 3 times per day  . hydrALAZINE  50 mg Oral 3 times per day  . insulin aspart  0-15 Units Subcutaneous TID WC  . insulin aspart  0-5 Units Subcutaneous QHS  . isosorbide mononitrate  120 mg Oral Daily  . metolazone  2.5 mg Oral BID  . pravastatin  40 mg Oral q1800  . sodium chloride  3 mL Intravenous Q12H  . trimethoprim-polymyxin b  2 drop Both Eyes Daily   Continuous Infusions:     Time spent: 25 MINUTES    Eddie North  [facility] Pager [phone] 7PM-7AM, please contact night-coverage at [email or web address], password [facility] 06/21/2014, 4:30 PM  LOS: 3 days

## 2014-06-22 LAB — RENAL FUNCTION PANEL
ALBUMIN: 2.5 g/dL — AB (ref 3.5–5.2)
Anion gap: 7 (ref 5–15)
BUN: 61 mg/dL — AB (ref 6–23)
CO2: 27 mmol/L (ref 19–32)
CREATININE: 4.11 mg/dL — AB (ref 0.50–1.10)
Calcium: 8.8 mg/dL (ref 8.4–10.5)
Chloride: 99 mmol/L (ref 96–112)
GFR calc Af Amer: 12 mL/min — ABNORMAL LOW (ref >90)
GFR calc non Af Amer: 10 mL/min — ABNORMAL LOW (ref >90)
GLUCOSE: 127 mg/dL — AB (ref 70–99)
Phosphorus: 4.8 mg/dL — ABNORMAL HIGH (ref 2.3–4.6)
Potassium: 3.8 mmol/L (ref 3.5–5.1)
Sodium: 133 mmol/L — ABNORMAL LOW (ref 135–145)

## 2014-06-22 LAB — GLUCOSE, CAPILLARY
Glucose-Capillary: 109 mg/dL — ABNORMAL HIGH (ref 70–99)
Glucose-Capillary: 163 mg/dL — ABNORMAL HIGH (ref 70–99)

## 2014-06-22 MED ORDER — ISOSORBIDE MONONITRATE ER 60 MG PO TB24: 60.0000 mg | ORAL_TABLET | Freq: Every day | ORAL | Status: DC

## 2014-06-22 MED ORDER — METOLAZONE 2.5 MG PO TABS: 2.5000 mg | ORAL_TABLET | Freq: Two times a day (BID) | ORAL | Status: DC

## 2014-06-22 MED ORDER — HYDRALAZINE HCL 100 MG PO TABS: 100.0000 mg | ORAL_TABLET | Freq: Three times a day (TID) | ORAL | Status: DC

## 2014-06-22 NOTE — Discharge Summary (Addendum)
Physician Discharge Summary  [name] BJY:[id number] DOB: [birth year] DOA: 06/18/2014  PCP: Jordan Marek, MD  Admit date: 06/18/2014 Discharge date: 06/22/2014  Time spent: 35 minutes  Recommendations for Outpatient Follow-up:  1. Discharge home with outpatient PCP follow-up. His monitor renal function as outpatient 2. Has follow-up with a nephrologist tomorrow  Discharge Diagnoses:  Principal Problem:   Acute on chronic combined systolic and diastolic heart failure  Active Problems:   DM2 (diabetes mellitus, type 2) with nephropathy   Anemia   Acute on chronic renal insufficiency   Chronic kidney disease (CKD), stage IV (severe)   DCM (dilated cardiomyopathy)     Discharge Condition:  Fair  Diet recommendation:   Heart healthy/diabetic  Discharge weight: 119 lbs  CODE STATUS: Full code  [facility] Weights   06/20/14 0529 06/21/14 0647 06/22/14 0637  Weight: 58.695 kg (129 lb 6.4 oz) 57.108 kg (125 lb 14.4 oz) 54.25 kg (119 lb 9.6 oz)    History of present illness:  Please  refer to admission H&P for details, in brief,64 year old female with chronic, and systolic and diastolic CHF, hypertension, type 2 diabetes mellitus, chronic kidney disease and chronic anemia presented with progressive weight gain and leg swellings with 10 pound weight gain over past 2 weeks despite being compliant with her salt intake and Lasix. Patient admitted for acute on chronic systolic and diastolic CHF.  Hospital Course:  Acute on chronic, systolic and diastolic CHF Patient placed on Lasix drip. metolazone dose increased. Diuresed well since admission. Net negative of 3.9 L since admission. Symptoms much better leg edema resolved. Replenished potassium. EF of 45 and 50% with grade 1 diastolic dysfunction as per echo from June 2015. Continue Coreg, aspirin and statin. Dose of hydralazine and Imdur have been increased. -Will discharge her on her home dose of Lasix 80 mg twice daily and have  added metolazone 2.5 mg twice daily. -Patient providing instructions on monitoring her diet and adhering to her medications. Instructions for heart failure symptoms provided.  Elevated troponin Likely related to CHF and underlying chronic kidney disease. Patient does not have any chest pain symptoms and stable on telemetry.  Acute on Chronic kidney disease stage IV EKG likely due to underlying diabetes mellitus and uncontrolled hypertension. Baseline creatinine is around 3.2. Worsened to 4.11 today with diuresis. Has follow-up with her nephrologist [name] tomorrow.  Essential hypertension Dose of metolazone, Imdur and hydralazine increased. Amlodipine discontinued due to leg edema.  Type 2 diabetes mellitus Stable. Not on any medications.   Anemia of chronic kidney disease/and iron deficiency Stable.  Continue iron Supplement    Family Communication: none At bedside   Disposition Plan: home with outpatient follow-up   Consultants:  None  Procedures:  None  Antibiotics:  None    Discharge Exam: Filed Vitals:   06/22/14 0558  BP: 142/81  Pulse: 73  Temp: 98.5 F (36.9 C)  Resp: 16    General: Elderly female in no acute distress HEENT: No pallor, moist oral mucosa, supple neck, no JVD Chest: Clear to auscultation bilaterally, no added sounds CVS: Normal S1 and S2, no murmurs rub or gallop GI: Soft, nontender, nondistended, bowel sounds present Musculoskeletal: warm, no edema CNS: alert and oriented, non focal  Discharge Instructions    Current Discharge Medication List  New medications  metolozone 2.5 mg       Take 1 tablet 2 times a day.  Qty: 60 tablet, 0 refills   CONTINUE these medications which have CHANGED   Details  hydrALAZINE (APRESOLINE) 100 MG tablet Take 1 tablet (100 mg total) by mouth every 8 (eight) hours. Qty: 90 tablet, Refills: 2   Associated Diagnoses: Essential hypertension     isosorbide mononitrate (IMDUR) 60 MG 24 hr tablet Take 1 tablet (60 mg total) by mouth daily. Qty: 30 tablet, Refills: 0      CONTINUE these medications which have NOT CHANGED   Details  amLODipine (NORVASC) 10 MG tablet Take 1 tablet (10 mg total) by mouth every morning. Qty: 30 tablet, Refills: 3    aspirin EC 81 MG tablet Take 81 mg by mouth every morning.    brimonidine-timolol (COMBIGAN) 0.2-0.5 % ophthalmic solution Place 1 drop into both eyes 3 (three) times daily. Qty: 1 Bottle, Refills: 5    carvedilol (COREG) 12.5 MG tablet Take 1 tablet (12.5 mg total) by mouth 2 (two) times daily with a meal. Qty: 60 tablet, Refills: 3   Associated Diagnoses: Congestive heart failure, unspecified congestive heart failure chronicity, unspecified congestive heart failure type    ferrous sulfate 325 (65 FE) MG tablet Take 1 tablet (325 mg total) by mouth 2 (two) times daily with a meal. Qty: 60 tablet, Refills: 3    furosemide (LASIX) 80 MG tablet Take 1 tablet (80 mg total) by mouth 2 (two) times daily. Qty: 90 tablet, Refills: 3   Associated Diagnoses: Congestive heart failure, unspecified congestive heart failure chronicity, unspecified congestive heart failure type    glimepiride (AMARYL) 1 MG tablet Take 1 tablet (1 mg total) by mouth daily before breakfast. Qty: 30 tablet, Refills: 0   Associated Diagnoses: Other specified diabetes mellitus without complications    pravastatin (PRAVACHOL) 40 MG tablet Take 1 tablet (40 mg total) by mouth daily. Qty: 30 tablet, Refills: 7   Associated Diagnoses: Hyperlipidemia; Congestive heart failure, unspecified congestive heart failure chronicity, unspecified congestive heart failure type    glucose monitoring kit (FREESTYLE) monitoring kit 1 each by Does not apply route 4 (four) times daily - after meals and at bedtime. 1 month Diabetic Testing Supplies for QAC-QHS accuchecks. Qty: 1 each, Refills: 1   Associated Diagnoses: Other specified  diabetes mellitus without complications    trimethoprim-polymyxin b (POLYTRIM) ophthalmic solution Place 2 drops into both eyes daily.    stop these medications: Amlodipine 10 mg daily      Allergies  Allergen Reactions  . Nitrofurantoin Monohyd Macro Nausea Only      The results of significant diagnostics from this hospitalization (including imaging, microbiology, ancillary and laboratory) are listed below for reference.    Significant Diagnostic Studies: Dg Chest 2 View  06/18/2014   CLINICAL DATA:  Worsening bilateral lower extremity swelling. Combined systolic and diastolic heart failure. Chronic kidney disease stage 4.  EXAM: CHEST  2 VIEW  COMPARISON:  10/24/2013  FINDINGS: Mild cardiomegaly is stable.  Small bilateral pleural effusions are seen, but decreased since previous study. Pulmonary vascular congestion and central perihilar interstitial prominence are suspicious for mild interstitial edema. No evidence of focal consolidation.  IMPRESSION: Cardiomegaly, small bilateral pleural effusions, and probable mild perihilar interstitial edema.   Electronically Signed   By: Earle Gell M.D.   On: 06/18/2014 17:33    Microbiology: No results found for this or any previous visit (from the past 240 hour(s)).   Labs: Basic Metabolic Panel:  Recent Labs Lab 06/18/14 1650 06/19/14 0226 06/20/14 7408 06/21/14 0525 06/22/14 0510  NA 136 137 138 138 133*  K 4.2 4.2 3.9 3.7 3.8  CL 103 105 105 103 99  CO2 22 26 23 27 27   GLUCOSE 143* 128* 104* 152* 127*  BUN 61* 59* 59* 60* 61*  CREATININE 3.94* 3.93* 3.76* 3.90* 4.11*  CALCIUM 8.8 8.7 8.5 8.5 8.8  PHOS  --   --  4.4 4.1 4.8*   Liver Function Tests:  Recent Labs Lab 06/19/14 0226 06/20/14 0525 06/21/14 0525 06/22/14 0510  AST 21  --   --   --   ALT 24  --   --   --   ALKPHOS 140*  --   --   --   BILITOT 0.4  --   --   --   PROT 5.8*  --   --   --   ALBUMIN 2.7* 2.5* 2.5* 2.5*   No results for input(s): LIPASE,  AMYLASE in the last 168 hours. No results for input(s): AMMONIA in the last 168 hours. CBC:  Recent Labs Lab 06/18/14 1650 06/19/14 0226  WBC 3.2* 3.3*  NEUTROABS 1.2* 1.1*  HGB 9.7* 10.0*  HCT 29.8* 31.1*  MCV 82.5 83.4  PLT 195 200   Cardiac Enzymes:  Recent Labs Lab 06/18/14 1650 06/18/14 2113 06/19/14 0226 06/19/14 0830  CKTOTAL  --  172  --   --   CKMB  --  4.4*  --   --   TROPONINI 0.06* 0.06* 0.06* 0.06*   BNP: BNP (last 3 results)  Recent Labs  06/18/14 1650  BNP >4500.0*    ProBNP (last 3 results)  Recent Labs  08/16/13 1758 08/20/13 1020 10/21/13 0938  PROBNP 15783.0* 8385.0* 32323.0*    CBG:  Recent Labs Lab 06/21/14 0546 06/21/14 1133 06/21/14 1640 06/21/14 2154 06/22/14 0525  GLUCAP 120* 159* 124* 167* 109*       Signed:  Curtistine Pettitt  [facility] 06/22/2014, 9:28 AM

## 2014-06-22 NOTE — Discharge Instructions (Signed)

## 2014-06-24 ENCOUNTER — Telehealth: Payer: Self-pay | Admitting: Internal Medicine

## 2014-06-24 NOTE — Telephone Encounter (Signed)
Pt was seen by Dr [name] at [facility] who recommends pt to go to Dr Darrick Penna with [facility]. Pt has appt with Dr Darrick Penna on 06/30/14 but needs a referral/authorization for visit from PCP for insurance purposes.  Please f/u with Randa Evens at [facility] with any questions.

## 2014-06-25 ENCOUNTER — Other Ambulatory Visit: Payer: Self-pay | Admitting: *Deleted

## 2014-06-25 ENCOUNTER — Encounter: Payer: Self-pay | Admitting: Vascular Surgery

## 2014-06-25 DIAGNOSIS — Z01810 Encounter for preprocedural cardiovascular examination: Secondary | ICD-10-CM

## 2014-06-25 DIAGNOSIS — N185 Chronic kidney disease, stage 5: Secondary | ICD-10-CM

## 2014-06-29 ENCOUNTER — Inpatient Hospital Stay: Payer: Self-pay | Admitting: Internal Medicine

## 2014-06-30 ENCOUNTER — Other Ambulatory Visit (HOSPITAL_COMMUNITY): Payer: Self-pay

## 2014-06-30 ENCOUNTER — Ambulatory Visit: Payer: Self-pay | Admitting: Vascular Surgery

## 2014-06-30 ENCOUNTER — Encounter (HOSPITAL_COMMUNITY): Payer: Self-pay

## 2014-07-02 ENCOUNTER — Other Ambulatory Visit: Payer: Self-pay | Admitting: Internal Medicine

## 2014-07-02 NOTE — Telephone Encounter (Signed)
Pt calling to request refill on glimepiride (AMARYL) 1 MG tablet, please f/u with pt.

## 2014-07-05 ENCOUNTER — Other Ambulatory Visit: Payer: Self-pay | Admitting: *Deleted

## 2014-07-05 DIAGNOSIS — E139 Other specified diabetes mellitus without complications: Secondary | ICD-10-CM

## 2014-07-05 MED ORDER — GLIMEPIRIDE 1 MG PO TABS: 1.0000 mg | ORAL_TABLET | Freq: Every day | ORAL | Status: DC

## 2014-07-05 NOTE — Telephone Encounter (Signed)
Rx send to [facility] 

## 2014-07-22 ENCOUNTER — Encounter: Payer: Self-pay | Admitting: *Deleted

## 2014-07-22 DIAGNOSIS — I509 Heart failure, unspecified: Secondary | ICD-10-CM

## 2014-07-28 ENCOUNTER — Other Ambulatory Visit: Payer: Self-pay

## 2014-07-28 NOTE — Patient Outreach (Signed)
[facility]) Care Management  07/28/2014  AYSHAH ALVA 12/06/1950 [id number]   Triage Screening Note: Referral Date:  07/06/14  Referral Source:  [employer] , [place] Issue:  HF, COPD, DM, HTN, CKD IV, Education and Disease Management needs.  Recent IP for CHF; [facility].  PCP:  Dr. Doris Cheadle Insurance:  [place] ID# [id number]  Outreach call #1 (coverage for RN CM,  Colleen Can).  Patient not reached.  RN CM left HIPAA compliant voice message requesting call back.  RN CM scheduled for next outreach call within one week.   Donato Schultz, RN, BSN, [facility], CCM  [facility] [employer] Management Coordinator [phone] Office [phone] Direct [phone] Cell

## 2014-07-30 ENCOUNTER — Other Ambulatory Visit: Payer: Self-pay

## 2014-07-30 NOTE — Patient Outreach (Signed)
[facility]) Care Management  07/30/2014  [name] 1950/05/28 [id number]   Triage Screening Note: Referral Date: 07/06/14  Referral Source: [employer] , [place] Issue: HF, COPD, DM, HTN, CKD IV, Education and Disease Management needs.  Recent IP for CHF; has been [facility].  PCP: Dr. Doris Cheadle Insurance: [place] ID# [id number]  Outreach call #2 to home # [phone] (coverage for RN CM, Colleen Can).  Patient not reached. No answer following several rings.   Same day attempt to cell # [phone].  Patient reached but states not a good time for a call.  Requested call back next week in the afternoon hours of 3-4pm.  RN CM scheduled for next outreach call within one week.   Donato Schultz, RN, BSN, [facility], CCM  [facility] [employer] [facility] Coordinator [phone] Office [phone] Direct [phone] Cell

## 2014-08-02 ENCOUNTER — Ambulatory Visit: Payer: Self-pay

## 2014-08-03 ENCOUNTER — Other Ambulatory Visit: Payer: Self-pay

## 2014-08-03 NOTE — Patient Outreach (Signed)
[facility]) Care Management  08/03/2014  ASH BRACH 11/15/1950 [id number]   Outreach call #3 for Triage Screening call.  Patient not reached.  RN CM left HIPAA compliant message with name and contact # for call back.   RN CM will send patient unsuccessful outreach letter.  RN CM will review case again in 10 business days and close case if no response.   Donato Schultz, RN, BSN, [facility], CCM  [facility] [employer] Management Coordinator [phone] Office [phone] Direct [phone] Cell

## 2014-08-11 ENCOUNTER — Other Ambulatory Visit: Payer: Self-pay | Admitting: Internal Medicine

## 2014-08-18 ENCOUNTER — Other Ambulatory Visit: Payer: Self-pay

## 2014-08-18 NOTE — Patient Outreach (Signed)
[facility]) Care Management  08/18/2014  [name] [birth year] [id number]    Triage Screening Note: Referral Date: 07/06/14  Referral Source: [employer] , [place] Issue: HF, COPD, DM, HTN, CKD IV, Education and Disease Management needs. Telephonic Case Management Start Date:  08/18/2014  Admission 06/18/14 - 06/22/14 CHF;  referred to [facility] MD.  PCP: [facility] Cardiologist:  (patient unable to identify name but states has pending appt 08/2014. Nephrologist:  Dr [name] at [facility] Dr Darrick Penna [facility]. - appt with Dr Darrick Penna on 06/30/14 Pharmacy:  [facility] [address]  Insurance: Francine Graven ID# [id number]  Outreach attempt # 4.  Patient reached at preferred contact # [phone] cell #.  Triage Screening completed this call.   Social:  Lives in her home with her husband.   Caregiver:  [name], [name] [phone] home or [phone] cell. 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 Weight:  100-110 - scales in the home but unable to give specific daily weights.   Cardiologist:  (patient unable to identify name but states has pending appt 08/2014. RN CM identifies patient with CHF education and self management deficits.   DM 2 BS in the 80's, however, patient confirms she is not checking her BS readings due to no glucometer which has been misplaced.  Patient states glucometer was provided to her about one year ago from the MD office.   States she thinks her MD has ordered BS checks 2-3 times / day but is not sure.  A1C - states "last check was good" but unable to give specific reading.  Last recorded A1C 6.9 on 06/19/14 per Epic MR.  RN CM instructed that [employer] provides coverage for glucometer and supplies with MD order.  RN CM  will contact primary MD for glucometer and testing supplies order.   HTN States last reading was good but unable to give reading.  No BP cuff in the home but states interest in purchasing one.  States she is a member of [employer] and will check on getting one there.  RN CM will assess purchase of BP cuff on future contact calls.   CKD IV States kidneys are doing better.  RN CM confirmed that MD has discussed dialysis with patient and ordered dialysis classes to provide education.  Patient has not completed and states possible plan for July. Per EPIC MR:  H/o Pt was seen by Dr [name] at [facility] who recommends pt to go to Dr Darrick Penna [facility]. Pt has appt with Dr Darrick Penna on 06/30/14 but needs a referral/authorization for visit from PCP for insurance purposes. Please f/u with [name] at [facility] with any questions RN CM provided education on importance of management and compliance of all conditions in order to protect kidneys and avoid risk of having to start dialysis.  RN CM will follow-up on coordination of dialysis class  Consent: Patient provided verbal consent and agrees to [facility] services and next phone contact call. Written consent to be mailed to patient.    Plan: Telephonic RN CM Services start date:  08/18/2015 Acuity Level III  Care Coordination needs:   -Glucometer and diabetic testing supplies -Dialysis Class - confirm scheduling of appt - possibly in July -  RN CM will schedule next contact call to complete initial assessment within next two weeks by 09/01/14) RN CM will mail patient  [facility] introductory package, consent form and patient successful outreach letter.  Educational Materials:  [facility] Orange Diabetes Education package mailed to patient.  RN CM notified [facility] administrative assistant:  Patient agreed to services and to update case status to active.   Donato Schultz, RN, BSN, [facility], CCM  [facility] [employer] Management Coordinator [phone] Office [phone] Direct [phone] Cell

## 2014-08-19 ENCOUNTER — Other Ambulatory Visit: Payer: Self-pay

## 2014-08-19 DIAGNOSIS — E139 Other specified diabetes mellitus without complications: Secondary | ICD-10-CM

## 2014-08-19 MED ORDER — GLIMEPIRIDE 1 MG PO TABS
1.0000 mg | ORAL_TABLET | Freq: Every day | ORAL | Status: DC
Start: 2014-08-19 — End: 2014-12-24

## 2014-08-19 NOTE — Progress Notes (Unsigned)
Patient came into office requesting a refill on her glimepiride [facility]

## 2014-09-02 ENCOUNTER — Ambulatory Visit: Payer: Self-pay | Admitting: Cardiology

## 2014-09-28 ENCOUNTER — Other Ambulatory Visit: Payer: Self-pay | Admitting: Internal Medicine

## 2014-09-30 ENCOUNTER — Other Ambulatory Visit: Payer: Self-pay

## 2014-09-30 DIAGNOSIS — E118 Type 2 diabetes mellitus with unspecified complications: Secondary | ICD-10-CM

## 2014-09-30 NOTE — Patient Outreach (Signed)
[facility]) Care Management  09/30/2014  [name] Apr 12, 1950 [id number]  Telephonic Care Management Note:    Referral Date: 07/06/14  Referral Source: [employer]back Issue: HF, COPD, DM, HTN, CKD IV, Education and Disease Management needs. Insurance: Francine Graven ID# [id number] Telephonic Case Management Start Date: 08/18/2014 - 09/30/14 Admission 06/18/14 - 06/22/14 CHF; referred to Vein and Vascular MD.   Interdisciplinary Team PCP: Dr. Doris Cheadle - [facility] - last appt 06/11/14 Cardiologist: last appt 08/2014, but did not keep appt due to dealing with cataract.  Rescheduled for 10/2014  Nephrologist: Dr [name] at [facility] - last appt 06/19/14 Dr Dar[facility] Penna [facility]. - [address], [place], [place] [address] [phone] - last appt  06/30/14 Optometrist:  Dr. Carlyle Dolly [phone] - last appt May or June.   Left Cataract surgery is recommended.  MD would like to refer patient to Dr. Mia Creek, [facility] [place], [place] [phone]  Patient states Primary MD., Dr. Orpah Cobb needs to call [facility] to get authorization for referral to Dr. Vonna Kotyk.   [facility] [address]   Social: Patient reached at preferred contact # [phone] cell #.  Lives in her home with her husband.  Caregiver: [place], Baken [phone] home or [phone] cell. Transportation: Husband DME: Scales, glucometer; friend gave her a glucometer, BP cuff; Friend gave her a cuff.   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 Weight: 110 Cardiologist: (patient unable to identify name and confirms she did not keep the  08/2014 appt; rescheduled for 10/2014 because she wants to get her cataract surgery completed and then she states she will deal with other things.  RN CM identifies patient with CHF  education and self management deficits.  RN CM identifies non-adherence to appt compliance.  RN CM identifies no history of Cardiology or Primary MD follow-up since 06/22/14 discharge date.  RN CM will refer to [facility] Community RN CM services to better assess patient and address compliance issues.    DM 2 BS in the 80's. Patient states glucometer was provided to her about one year ago from the MD office but she misplaced and a friend gave her another one. States she thinks her MD has ordered BS checks 2-3 times / day but is not sure.  A1C - states "last check was good" but unable to give specific reading. Per Epic MR:  A1C 6.9 on 06/19/14. H/O Educational Materials: [facility] Orange Diabetes Education package mailed to patient 08/18/2014; patient does not recall getting this information stating she will look for the package again at home.  RN CM instructed that [employer] provides coverage for glucometer and supplies with MD order and no reason for patient to not have a glucometer.  However,  Provider will only cover meter about every 5 years.  RN CM identifies non-adherence to BS checks and logging; lack of Disease Management education.   HTN States last reading was good but unable to give reading. Patient reported on 08/18/14 call that a friend gave her a cuff but reports today she does not have a cuff.   States she is a member of [employer] and will check on getting one there.  RN CM will refer to Community RN CM services to continue to assess purchase of BP cuff on future contact calls.   CKD IV  (severe) States kidneys are doing better. RN CM  confirmed that MD has discussed dialysis with patient and ordered dialysis classes to provide education. Patient has not completed and previously reported a plan to start classes in July.  Patient states she now plans to start in August because she wants to get her cataract surgery out of the way.  Patient states she does NOT want to start classes yet.  Per EPIC  MR: H/O Pt was seen by Dr [name] at [facility] who recommends pt to go to Dr Darrick Penna [facility]. Pt has appt with Dr Darrick Penna [address] [place], [place] [address] on 06/30/14 but needs a referral/authorization for visit from PCP for insurance purposes. Please follow-up with Randa Evens at [facility] with any questions.  (Patient did not confirm if she kept this appt this phone contact).  RN CM provided education on importance of management and compliance of all conditions in order to protect kidneys and avoid risk of having to start dialysis.  RN CM will refer to Community RN CM to address adherence to treatment plan and recommendations.   Consent:  Patient provided verbal consent and agrees to [facility] Telephonic and Nurse, children's [employer]. H/O [facility] introductory package, consent form and patient successful outreach letter mailed to patient on 08/18/2014.  Patient is unable to recall if she received and states she will look for this information again.Marland Kitchen  RN CM advised will mail 2nd copy to patient and encouraged to complete and return to [facility]. (e-mail sent to [facility] and requested mailing).  RN CM encouraged patient to contact RN CM for any assistance needed with care coordination needs.   RN CM advised to please notify MD of any changes in her condition prior to scheduled appt's.   RN CM provided contact name and #, 24-hour nurse line # [phone].   RN CM confirmed patient is aware of 911 services for urgent emergency needs.   Plan: RN CM sent referral to [facility] Community RN CM services -Non-adherence with BS management, keeping MD appts.  (Patient high risk for Dialysis and needs to practice compliance to avoid increased complications)  RN CM advised patient that Community RN CM will follow-up again within 10 days   Donato Schultz, RN, BSN, [facility], CCM  [facility] [employer] Management Care Management  Coordinator [phone] Office [phone] Direct [phone] Cell

## 2014-09-30 NOTE — Patient Outreach (Signed)
[facility] Customer service manager [facility]) Care Management  09/30/2014  [name] [birth year] [id number]   Request from Donato Schultz, RN to assign Community RN, Kemper Durie, RN assigned to outreach.  Corrie Mckusick. Sharlee Blew [facility] Care Management [facility] CM Assistant Phone: [phone] Fax: [phone]

## 2014-10-06 ENCOUNTER — Other Ambulatory Visit: Payer: Self-pay | Admitting: *Deleted

## 2014-10-06 NOTE — Patient Outreach (Signed)
Referral received from telephonic care manager, C. Hutchinson, for community care manager involvement.  Per chart, member has history of diabetes, kidney disease, hypertension, and congestive heart failure.  Call placed to member in effort to establish care.  No answer, HIPPA compliant voice message left.  Will await call back.  If no call back, will make second attempt to contact member next week.  Kemper Durie, BSN, [facility] Care Management  [facility] Care Manager [phone]

## 2014-10-07 ENCOUNTER — Other Ambulatory Visit: Payer: Self-pay | Admitting: *Deleted

## 2014-10-07 NOTE — Patient Outreach (Signed)
Second attempt made to contact the member at the home number listed in the chart [phone]).  No answer, HIPPA compliant voice message left.  Will await call back.  Message sent to telephonic care manager, C. Hutchinson, inquiring about alternative contact information as she has made contact with the member previously.  Will wait for feedback form Mrs. Hutchinson and continue to make attempts to contact member next week.  Kemper Durie, BSN, [facility] Care Management  [facility] Care Manager [phone]

## 2014-10-08 ENCOUNTER — Other Ambulatory Visit: Payer: Self-pay

## 2014-10-08 NOTE — Patient Outreach (Signed)
[facility]) Care Management  10/08/2014  [name] 07/05/1950 [id number]  Epic in-basket response to [facility] LCSW,   Hi Monica,   Patient reached at preferred contact # [phone] cell #. Lives in her home with her husband.   Caregiver: Alissia, Lafazia [phone] home or [phone] cell.   Thanks,   Donato Schultz, RN, BSN, [facility], CCM  [facility] [employer] Management Coordinator  [phone] Office  [phone] Direct  [phone] Cell

## 2014-10-08 NOTE — Patient Outreach (Signed)
[facility] Customer service manager [facility]) Care Management  10/08/2014  [name] 1950-10-07 [id number]   Care Coordination Note  RN CM contacted patient at preferred # [phone].  RN CM advised that patient will be receiving another call from [facility] RN CM to scheduled home visit.  Patient states she agrees to home visit and will decide if she wants to continue with services after that visit.  RN CM sent Epic update to [facility] SW assigned.   Donato Schultz, RN, BSN, [facility], CCM  [facility] [employer] Management Coordinator [phone] Office [phone] Direct [phone] Cell

## 2014-10-11 ENCOUNTER — Other Ambulatory Visit: Payer: Self-pay | Admitting: *Deleted

## 2014-10-11 NOTE — Patient Outreach (Signed)
Third unsuccessful attempt made to contact member.  Previous attempts were made using the home number [phone]), with no call back after messages left.  This attempt was made using the member's preferred number [phone]) according to the telephonic care manager.  Still no answer, voice message left.  Will send outreach letter while waiting for call back.  If no call back within 10 days, will close case.  Kemper Durie, BSN, [facility] Care Management  [facility] Care Manager [phone]

## 2014-10-14 ENCOUNTER — Telehealth: Payer: Self-pay | Admitting: Internal Medicine

## 2014-10-14 ENCOUNTER — Encounter (HOSPITAL_COMMUNITY): Payer: Self-pay | Admitting: Emergency Medicine

## 2014-10-14 ENCOUNTER — Telehealth: Payer: Self-pay

## 2014-10-14 ENCOUNTER — Emergency Department (HOSPITAL_COMMUNITY)
Admission: EM | Admit: 2014-10-14 | Discharge: 2014-10-15 | Disposition: A | Discharge status: Home / Self Care | Payer: Commercial Managed Care - HMO | Attending: Emergency Medicine | Admitting: Emergency Medicine

## 2014-10-14 DIAGNOSIS — I5042 Chronic combined systolic (congestive) and diastolic (congestive) heart failure: Secondary | ICD-10-CM | POA: Insufficient documentation

## 2014-10-14 DIAGNOSIS — E119 Type 2 diabetes mellitus without complications: Secondary | ICD-10-CM | POA: Diagnosis not present

## 2014-10-14 DIAGNOSIS — Z7982 Long term (current) use of aspirin: Secondary | ICD-10-CM | POA: Diagnosis not present

## 2014-10-14 DIAGNOSIS — H409 Unspecified glaucoma: Secondary | ICD-10-CM | POA: Insufficient documentation

## 2014-10-14 DIAGNOSIS — Z862 Personal history of diseases of the blood and blood-forming organs and certain disorders involving the immune mechanism: Secondary | ICD-10-CM | POA: Diagnosis not present

## 2014-10-14 DIAGNOSIS — E785 Hyperlipidemia, unspecified: Secondary | ICD-10-CM | POA: Diagnosis not present

## 2014-10-14 DIAGNOSIS — Z79899 Other long term (current) drug therapy: Secondary | ICD-10-CM | POA: Insufficient documentation

## 2014-10-14 DIAGNOSIS — H269 Unspecified cataract: Secondary | ICD-10-CM | POA: Insufficient documentation

## 2014-10-14 DIAGNOSIS — I10 Essential (primary) hypertension: Secondary | ICD-10-CM | POA: Diagnosis not present

## 2014-10-14 DIAGNOSIS — M7989 Other specified soft tissue disorders: Secondary | ICD-10-CM | POA: Diagnosis present

## 2014-10-14 DIAGNOSIS — Z87891 Personal history of nicotine dependence: Secondary | ICD-10-CM | POA: Diagnosis not present

## 2014-10-14 LAB — COMPREHENSIVE METABOLIC PANEL
ALBUMIN: 2.9 g/dL — AB (ref 3.5–5.0)
ALT: 19 U/L (ref 14–54)
AST: 19 U/L (ref 15–41)
Alkaline Phosphatase: 120 U/L (ref 38–126)
Anion gap: 9 (ref 5–15)
BUN: 45 mg/dL — AB (ref 6–20)
CHLORIDE: 105 mmol/L (ref 101–111)
CO2: 22 mmol/L (ref 22–32)
Calcium: 8.5 mg/dL — ABNORMAL LOW (ref 8.9–10.3)
Creatinine, Ser: 3.65 mg/dL — ABNORMAL HIGH (ref 0.44–1.00)
GFR calc Af Amer: 14 mL/min — ABNORMAL LOW (ref >60)
GFR calc non Af Amer: 12 mL/min — ABNORMAL LOW (ref >60)
GLUCOSE: 116 mg/dL — AB (ref 65–99)
POTASSIUM: 3.6 mmol/L (ref 3.5–5.1)
Sodium: 136 mmol/L (ref 135–145)
TOTAL PROTEIN: 5.9 g/dL — AB (ref 6.5–8.1)
Total Bilirubin: 0.6 mg/dL (ref 0.3–1.2)

## 2014-10-14 LAB — CBC WITH DIFFERENTIAL/PLATELET
Basophils Absolute: 0 10*3/uL (ref 0.0–0.1)
Basophils Relative: 0 % (ref 0–1)
EOS PCT: 4 % (ref 0–5)
Eosinophils Absolute: 0.1 10*3/uL (ref 0.0–0.7)
HCT: 28.5 % — ABNORMAL LOW (ref 36.0–46.0)
Hemoglobin: 9.4 g/dL — ABNORMAL LOW (ref 12.0–15.0)
Lymphocytes Relative: 32 % (ref 12–46)
Lymphs Abs: 1.1 10*3/uL (ref 0.7–4.0)
MCH: 27.6 pg (ref 26.0–34.0)
MCHC: 33 g/dL (ref 30.0–36.0)
MCV: 83.8 fL (ref 78.0–100.0)
MONO ABS: 0.6 10*3/uL (ref 0.1–1.0)
MONOS PCT: 17 % — AB (ref 3–12)
NEUTROS ABS: 1.6 10*3/uL — AB (ref 1.7–7.7)
Neutrophils Relative %: 47 % (ref 43–77)
Platelets: 193 10*3/uL (ref 150–400)
RBC: 3.4 MIL/uL — ABNORMAL LOW (ref 3.87–5.11)
RDW: 18.1 % — ABNORMAL HIGH (ref 11.5–15.5)
WBC: 3.5 10*3/uL — ABNORMAL LOW (ref 4.0–10.5)

## 2014-10-14 NOTE — Telephone Encounter (Signed)
Patient called requesting medication refill on BP medication, patient states she is almost out of medication. Please f/u

## 2014-10-14 NOTE — Telephone Encounter (Signed)
Returned patient phone call Patient not available Left message on voice mail to return our call 

## 2014-10-14 NOTE — ED Notes (Addendum)
Pt. reports swelling/ edema at lower legs and feet onset yesterday " it feels tight and itchy" , denies injury/ no pain , respirations unlabored.

## 2014-10-15 ENCOUNTER — Emergency Department (HOSPITAL_COMMUNITY): Payer: Commercial Managed Care - HMO

## 2014-10-15 LAB — BRAIN NATRIURETIC PEPTIDE: B Natriuretic Peptide: >4500 pg/mL — ABNORMAL HIGH (ref 0.0–100.0)

## 2014-10-15 MED ORDER — FUROSEMIDE 20 MG PO TABS
40.0000 mg | ORAL_TABLET | Freq: Once | ORAL | Status: AC
  Administered 2014-10-15: 40 mg via ORAL
  Filled 2014-10-15: qty 2

## 2014-10-15 MED ORDER — FUROSEMIDE 40 MG PO TABS: 40.0000 mg | ORAL_TABLET | ORAL | Status: DC

## 2014-10-15 NOTE — ED Provider Notes (Signed)
CSN: [id number]     Arrival date & time 10/14/14  2109 History   First MD Initiated Contact with Patient 10/14/14 2351     Chief Complaint  Patient presents with  . Leg Swelling     (Consider location/radiation/quality/duration/timing/severity/associated sxs/prior Treatment) The history is provided by the patient and medical records. No language interpreter was used.     [name] is a 64 y.o. female  with a hx of HTN, NIDDM, anemia, left ventricular hypertrophy, CHF (chronic systolic and diastolic), CKD (stage 4) presents to the Emergency Department complaining of gradual, persistent, progressively worsening edema of the lower legs onset yesterday. Pt reports she has been taking her lasix BID as directed.  Associated symptoms include tightness in her legs.  No long car trips, Hx of DVT, recent immobilization, fever, chills, cough, chest pain, SOB, abd pain, N/V/D, increased warmth or redness in her legs.  Denies orthopnea. Nothing makes it better and nothing makes it worse.    PCP: [facility]  Record Review shows previous nuclear stress test demonstrated no ischemia, probable inferior infarct and EF 40%. Echo showed EF of 45-50% as noted in 2015.     Past Medical History  Diagnosis Date  . Hypertension   . Diabetes mellitus   . Anemia   . Hyperlipidemia   . Left ventricular hypertrophy   . Cataract     Left eye  . Glaucoma    Past Surgical History  Procedure Laterality Date  . Cholecystectomy     Family History  Problem Relation Age of Onset  . Diabetes type II    . Hypertension    . Diabetes Mother    History  Substance Use Topics  . Smoking status: Former Research scientist (life sciences)  . Smokeless tobacco: Never Used  . Alcohol Use: No   OB History    No data available     Review of Systems  Constitutional: Negative for fever, diaphoresis, appetite change, fatigue and unexpected weight change.  HENT: Negative for mouth sores.   Eyes: Negative for visual disturbance.   Respiratory: Negative for cough, chest tightness, shortness of breath and wheezing.   Cardiovascular: Positive for leg swelling. Negative for chest pain.  Gastrointestinal: Negative for nausea, vomiting, abdominal pain, diarrhea and constipation.  Endocrine: Negative for polydipsia, polyphagia and polyuria.  Genitourinary: Negative for dysuria, urgency, frequency and hematuria.  Musculoskeletal: Negative for back pain and neck stiffness.  Skin: Negative for rash.  Allergic/Immunologic: Negative for immunocompromised state.  Neurological: Negative for syncope, light-headedness and headaches.  Hematological: Does not bruise/bleed easily.  Psychiatric/Behavioral: Negative for sleep disturbance. The patient is not nervous/anxious.       Allergies  Nitrofurantoin monohyd macro  Home Medications   Prior to Admission medications   Medication Sig Start Date End Date Taking? Authorizing Provider  aspirin EC 81 MG tablet Take 81 mg by mouth every morning.   Yes Historical Provider, MD  brimonidine-timolol (COMBIGAN) 0.2-0.5 % ophthalmic solution Place 1 drop into both eyes 3 (three) times daily. 09/02/13  Yes Theodis Blaze, MD  carvedilol (COREG) 12.5 MG tablet Take 1 tablet (12.5 mg total) by mouth 2 (two) times daily with a meal. 02/15/14  Yes Deepak Advani, MD  ferrous sulfate 325 (65 FE) MG tablet Take 1 tablet (325 mg total) by mouth 2 (two) times daily with a meal. 04/30/14  Yes Deepak Advani, MD  furosemide (LASIX) 80 MG tablet Take 1 tablet (80 mg total) by mouth 2 (two) times daily. 04/30/14  Yes Lorayne Marek, MD  glimepiride (AMARYL) 1 MG tablet Take 1 tablet (1 mg total) by mouth daily before breakfast. 08/19/14  Yes Deepak Advani, MD  hydrALAZINE (APRESOLINE) 100 MG tablet Take 1 tablet (100 mg total) by mouth every 8 (eight) hours. 06/22/14  Yes Nishant Dhungel, MD  isosorbide mononitrate (IMDUR) 60 MG 24 hr tablet Take 1 tablet (60 mg total) by mouth daily. 06/22/14  Yes Nishant Dhungel,  MD  metolazone (ZAROXOLYN) 2.5 MG tablet Take 1 tablet (2.5 mg total) by mouth 2 (two) times daily. 06/22/14  Yes Nishant Dhungel, MD  pravastatin (PRAVACHOL) 40 MG tablet Take 1 tablet (40 mg total) by mouth daily. 02/15/14  Yes Lorayne Marek, MD  furosemide (LASIX) 40 MG tablet Take 1 tablet (40 mg total) by mouth every morning. 10/15/14   Jarrett Soho Lamarion Mcevers, PA-C  glucose monitoring kit (FREESTYLE) monitoring kit 1 each by Does not apply route 4 (four) times daily - after meals and at bedtime. 1 month Diabetic Testing Supplies for QAC-QHS accuchecks. 04/30/14   Lorayne Marek, MD   BP 158/99 mmHg  Pulse 87  Temp(Src) 97.8 F (36.6 C) (Oral)  Resp 19  Ht 5' 1"  (1.549 m)  Wt 127 lb 14.4 oz (58.015 kg)  BMI 24.18 kg/m2  SpO2 97% Physical Exam  Constitutional: She appears well-developed and well-nourished. No distress.  Awake, alert, nontoxic appearance  HENT:  Head: Normocephalic and atraumatic.  Mouth/Throat: Oropharynx is clear and moist. No oropharyngeal exudate.  Eyes: Conjunctivae are normal. No scleral icterus.  Neck: Normal range of motion. Neck supple.  Cardiovascular: Normal rate, regular rhythm, normal heart sounds and intact distal pulses.   No murmur heard. Pulmonary/Chest: Effort normal and breath sounds normal. No respiratory distress. She has no wheezes.  Equal chest expansion Clear and equal breath sounds  Abdominal: Soft. Bowel sounds are normal. She exhibits no mass. There is no tenderness. There is no rebound and no guarding.  Soft and nontedner No ascites  Musculoskeletal: Normal range of motion. She exhibits edema.  2+ pitting edema from the mid forefoot to the upper thigh No redness, lesions, weeping, increased warmth  Neurological: She is alert.  Speech is clear and goal oriented Moves extremities without ataxia  Skin: Skin is warm and dry. She is not diaphoretic.  Psychiatric: She has a normal mood and affect.  Nursing note and vitals reviewed.   ED  Course  Procedures (including critical care time) Labs Review Labs Reviewed  CBC WITH DIFFERENTIAL/PLATELET - Abnormal; Notable for the following:    WBC 3.5 (*)    RBC 3.40 (*)    Hemoglobin 9.4 (*)    HCT 28.5 (*)    RDW 18.1 (*)    Neutro Abs 1.6 (*)    Monocytes Relative 17 (*)    All other components within normal limits  COMPREHENSIVE METABOLIC PANEL - Abnormal; Notable for the following:    Glucose, Bld 116 (*)    BUN 45 (*)    Creatinine, Ser 3.65 (*)    Calcium 8.5 (*)    Total Protein 5.9 (*)    Albumin 2.9 (*)    GFR calc non Af Amer 12 (*)    GFR calc Af Amer 14 (*)    All other components within normal limits  BRAIN NATRIURETIC PEPTIDE - Abnormal; Notable for the following:    B Natriuretic Peptide >4500.0 (*)    All other components within normal limits    Imaging Review Dg Chest 2 View  10/15/2014  CLINICAL DATA:  Bilateral lower extremity swelling for 2 days.  EXAM: CHEST  2 VIEW  COMPARISON:  06/18/2014  FINDINGS: There is mild hyperinflation. There is moderate unchanged cardiomegaly. There is vascular and interstitial congestion. There is central and basilar ground-glass opacity which likely represents alveolar edema. Small pleural effusions are collected in the posterior costophrenic angles.  IMPRESSION: Probable congestive heart failure with interstitial and alveolar edema. Very small effusions.   Electronically Signed   By: Andreas Newport M.D.   On: 10/15/2014 01:41     EKG Interpretation   Date/Time:  Friday October 15 2014 00:26:44 EDT Ventricular Rate:  83 PR Interval:  149 QRS Duration: 148 QT Interval:  426 QTC Calculation: 501 R Axis:   -51 Text Interpretation:  Sinus rhythm Right bundle branch block Prolonged QT  interval Baseline wander in lead(s) V6 No significant change since last  tracing Confirmed by Glynn Octave [phone]) on 10/15/2014 3:04:24 PM       <ECG>   MDM   Final diagnoses:  Leg swelling  Chronic combined  systolic and diastolic congestive heart failure   Shela Commons presents with BLE edema.  Pt with hx of same, but worse in the last few days.  No signs of secondary infection.  Pt is taking her lasix as prescribed.  Will check ECG, CXR and BNP.   2:20 AM Pt given PO Lasix in the ED.  CXR with probable congestive heart failure; image in PACS largely unchanged from previous.  BNP pending.  2:40PM Care transferred to Dr. Claudine Mouton who has evaluated the patient and agrees with the plan for discharge home pending BNP and Cardiology consult.  Pt has been admitted for diuresis due to similar episodes in the past, however she looks well today, is without CP, SOB or hypoxia.    BP 158/99 mmHg  Pulse 87  Temp(Src) 97.8 F (36.6 C) (Oral)  Resp 19  Ht 5' 1"  (1.549 m)  Wt 127 lb 14.4 oz (58.015 kg)  BMI 24.18 kg/m2  SpO2 97%     Abigail Butts, PA-C 10/15/14 1615  Everlene Balls, MD 10/16/14 1514

## 2014-10-15 NOTE — Discharge Instructions (Signed)
1. Medications: increase lasix to  in AM and  in PM, usual home medications 2. Treatment: rest, monitor fluid intake 3. Follow Up: Please followup with your primary doctor in 3 days for discussion of your diagnoses and further evaluation after today's visit; if you do not have a primary care doctor use the resource guide provided to find one; Please return to the ER for CP, SOB or other concerns    Edema Edema is an abnormal buildup of fluids in your bodytissues. Edema is somewhatdependent on gravity to pull the fluid to the lowest place in your body. That makes the condition more common in the legs and thighs (lower extremities). Painless swelling of the feet and ankles is common and becomes more likely as you get older. It is also common in looser tissues, like around your eyes.  When the affected area is squeezed, the fluid may move out of that spot and leave a dent for a few moments. This dent is called pitting.  CAUSES  There are many possible causes of edema. Eating too much salt and being on your feet or sitting for a long time can cause edema in your legs and ankles. Hot weather may make edema worse. Common medical causes of edema include:  Heart failure.  Liver disease.  Kidney disease.  Weak blood vessels in your legs.  Cancer.  An injury.  Pregnancy.  Some medications.  Obesity. SYMPTOMS  Edema is usually painless.Your skin may look swollen or shiny.  DIAGNOSIS  Your health care provider may be able to diagnose edema by asking about your medical history and doing a physical exam. You may need to have tests such as X-rays, an electrocardiogram, or blood tests to check for medical conditions that may cause edema.  TREATMENT  Edema treatment depends on the cause. If you have heart, liver, or kidney disease, you need the treatment appropriate for these conditions. General treatment may include:  Elevation of the affected body part above the level of your  heart.  Compression of the affected body part. Pressure from elastic bandages or support stockings squeezes the tissues and forces fluid back into the blood vessels. This keeps fluid from entering the tissues.  Restriction of fluid and salt intake.  Use of a water pill (diuretic). These medications are appropriate only for some types of edema. They pull fluid out of your body and make you urinate more often. This gets rid of fluid and reduces swelling, but diuretics can have side effects. Only use diuretics as directed by your health care provider. HOME CARE INSTRUCTIONS   Keep the affected body part above the level of your heart when you are lying down.   Do not sit still or stand for prolonged periods.   Do not put anything directly under your knees when lying down.  Do not wear constricting clothing or garters on your upper legs.   Exercise your legs to work the fluid back into your blood vessels. This may help the swelling go down.   Wear elastic bandages or support stockings to reduce ankle swelling as directed by your health care provider.   Eat a low-salt diet to reduce fluid if your health care provider recommends it.   Only take medicines as directed by your health care provider. SEEK MEDICAL CARE IF:   Your edema is not responding to treatment.  You have heart, liver, or kidney disease and notice symptoms of edema.  You have edema in your legs that does not  improve after elevating them.   You have sudden and unexplained weight gain. SEEK IMMEDIATE MEDICAL CARE IF:   You develop shortness of breath or chest pain.   You cannot breathe when you lie down.  You develop pain, redness, or warmth in the swollen areas.   You have heart, liver, or kidney disease and suddenly get edema.  You have a fever and your symptoms suddenly get worse. MAKE SURE YOU:   Understand these instructions.  Will watch your condition.  Will get help right away if you are not  doing well or get worse. Document Released: 03/05/2005 Document Revised: 07/20/2013 Document Reviewed: 12/26/2012 [facility] Patient Information 2015 [place], [place]. This information is not intended to replace advice given to you by your health care provider. Make sure you discuss any questions you have with your health care provider.

## 2014-10-18 ENCOUNTER — Telehealth: Payer: Self-pay

## 2014-10-18 DIAGNOSIS — I509 Heart failure, unspecified: Secondary | ICD-10-CM

## 2014-10-18 DIAGNOSIS — I10 Essential (primary) hypertension: Secondary | ICD-10-CM

## 2014-10-18 MED ORDER — CARVEDILOL 12.5 MG PO TABS: 12.5000 mg | ORAL_TABLET | Freq: Two times a day (BID) | ORAL | Status: DC

## 2014-10-18 MED ORDER — HYDRALAZINE HCL 100 MG PO TABS: 100.0000 mg | ORAL_TABLET | Freq: Three times a day (TID) | ORAL | Status: AC

## 2014-10-18 MED ORDER — ISOSORBIDE MONONITRATE ER 60 MG PO TB24: 60.0000 mg | ORAL_TABLET | Freq: Every day | ORAL | Status: DC

## 2014-10-18 MED ORDER — FUROSEMIDE 80 MG PO TABS: 80.0000 mg | ORAL_TABLET | Freq: Two times a day (BID) | ORAL | Status: DC

## 2014-10-18 NOTE — Telephone Encounter (Addendum)
Pt walked in c/o of leg swelling and need for medication refills.  Pt recently seen in ED was sent home with three 40 mg Lasix to take one daily for next few days. Pt stated she has been taking pervious doses of 80 mg BID and her 40 mg in the morning, as ordered.  Pt stated that leg swelling does get better when she goes to bed at night. Pt encouraged to elevate legs as much as possible.  Pt has no c/o of CP, palpitations, SOB, and no other swelling other than in legs. Pt stated she has not taken her Coreg for almost a month as she ran out of the medication and does not check BP at home. Checked pt BP 160/90; HR 76, oxygen 99 on RA    Pt needs Lasix 80 mg BID, Coreg 12.5 mg BID, Hydralazine HCL 100 mg Q8, and Imdur 60 mg Qdaily refilled.  Pt last seen in our office 05/2014 by Dr. Eden Emms and has f/u scheduled on 8/25 with Dr. Mayford Knife.  Pt refills sent to preferred pharmacy, [facility]  [address] pt aware important to come to the 8/25 as I will only refill these medications for a 30 day supply.  Pt agrees with plan no additional questions at this time.

## 2014-10-19 ENCOUNTER — Ambulatory Visit: Payer: Self-pay | Admitting: Internal Medicine

## 2014-10-25 ENCOUNTER — Encounter: Payer: Self-pay | Admitting: *Deleted

## 2014-10-25 NOTE — Patient Outreach (Signed)
[facility] Customer service manager [facility]) Care Management  10/25/2014  DANYLLE SPILLER 15-Sep-1950 [id number]   Outreach letter sent, no response after 10 business days.  Case closure letters sent to member and primary care physician.  Will contact care manager assistant to close case.    Kemper Durie, BSN, [facility] Care Management  [facility] Care Manager [phone]

## 2014-10-29 NOTE — Patient Outreach (Signed)
[facility]) Care Management  10/25/2014  MONITA MAKIN 03/24/1950 [id number]   Notification from [facility], RN to close case due to unable to contact patient for [facility] Care Management services.  Thanks, Corrie Mckusick. Sharlee Blew [facility] Care Management [facility] CM Assistant Phone: [phone] Fax: [phone]

## 2014-11-03 ENCOUNTER — Telehealth: Payer: Self-pay | Admitting: Cardiology

## 2014-11-03 NOTE — Telephone Encounter (Signed)
I spoke with the pt and she said the swelling in her feet and legs is a little worse than what it was when she left the ER on 10/14/14. From her ER visit she was given three 40 mg Lasix to take one daily in addition to her Lasix 80mg  twice a day.  She feels like this helped her swelling.  The pt denies SOB and any other associated symptoms.  The pt does not weigh on a regular basis.  I reviewed her medication list and noticed metolazone but she said that she is not taking this medication. Most recent BUN 45, Creatinine 3.65 (10/13/24).  The pt has a pending appointment on 11/11/14 with Dr Mayford Knife and she would like to know what she can do to help with swelling. At this time the pt is trying to elevate her legs and watch her salt intake.  I will forward this message to Dr Mayford Knife for review.

## 2014-11-03 NOTE — Telephone Encounter (Signed)
Pt c/o swelling: STAT is pt has developed SOB within 24 hours  1. How long have you been experiencing swelling? Weeks  2. Where is the swelling located? Legs  3.  Are you currently taking a "fluid pill"? Yes  4.  Are you currently SOB? No  5.  Have you traveled recently? No

## 2014-11-04 MED ORDER — METOLAZONE 2.5 MG PO TABS: ORAL_TABLET | ORAL | Status: DC

## 2014-11-04 NOTE — Telephone Encounter (Signed)
Needs to be seen by extender today

## 2014-11-04 NOTE — Telephone Encounter (Signed)
No extender availability today or tomorrow in the office.  I discussed this pt with Dr Graciela Husbands (DOD) and he contacted Dr [name] with [facility] in regards to managing the pt's diuretics. Per Dr Marisue Humble he would like the pt to use Metolazone as needed for swelling.  Dr Marisue Humble will have [facility] contact the pt to schedule follow-up labs and appointment.   I contacted the pt and she does have a bottle of Metolazone 2.5mg  that she states was given to her when she left the ER last month.  She said that she was only given 3 pills to use as needed and she has used these pills.  I will send in a Rx for 5 pills at this time.  I made the pt aware that these pills are only to be used as needed for swelling and the pill should be taken 30 minutes prior to her morning dosage of furosemide. The pt is aware that [facility] will be contacting her to arrange follow-up labs and appointment. Pt agreed with plan and verbalized understanding of instructions for metolazone.

## 2014-11-11 ENCOUNTER — Ambulatory Visit (INDEPENDENT_AMBULATORY_CARE_PROVIDER_SITE_OTHER): Payer: Commercial Managed Care - HMO | Admitting: Cardiology

## 2014-11-11 ENCOUNTER — Encounter: Payer: Self-pay | Admitting: Cardiology

## 2014-11-11 VITALS — BP 110/62 | HR 61 | Ht 61.0 in | Wt 129.7 lb

## 2014-11-11 DIAGNOSIS — R609 Edema, unspecified: Secondary | ICD-10-CM | POA: Diagnosis not present

## 2014-11-11 DIAGNOSIS — I5042 Chronic combined systolic (congestive) and diastolic (congestive) heart failure: Secondary | ICD-10-CM | POA: Diagnosis not present

## 2014-11-11 DIAGNOSIS — N184 Chronic kidney disease, stage 4 (severe): Secondary | ICD-10-CM | POA: Diagnosis not present

## 2014-11-11 DIAGNOSIS — I10 Essential (primary) hypertension: Secondary | ICD-10-CM | POA: Insufficient documentation

## 2014-11-11 DIAGNOSIS — R600 Localized edema: Secondary | ICD-10-CM

## 2014-11-11 HISTORY — DX: Localized edema: R60.0

## 2014-11-11 HISTORY — DX: Chronic combined systolic (congestive) and diastolic (congestive) heart failure: I50.42

## 2014-11-11 NOTE — Progress Notes (Signed)
Cardiology Office Note   Date:  11/11/2014   ID:  [name], DOB Mar 02, 1951, MRN [id number]  PCP:  Lorayne Marek, MD    Chief Complaint  Patient presents with  . Follow-up    chf      History of Present Illness: [name] is a 64 y.o. female with history of hypertension, diabetes, CHF, chronic renal insufficiency Previously seen  In hospital 8/15 with worseing renal function and CHF. Cr running 3.24.  She is seeing Dr Joelyn Oms for renal regularly.  She denies any chest pain, SOB, DOE, palpitations, dizziness or syncope. She has noticed increased swelling recently.  She does use some salt with cooking.     Echo 6/15 Reviewed  Study Conclusions  - Left ventricle: The cavity size was normal. Wall thickness was increased in a pattern of mild LVH. Systolic function was mildly reduced. The estimated ejection fraction was in the range of 45% to 50%. Wall motion was normal; there were no regional wall motion abnormalities. Doppler parameters are consistent with abnormal left ventricular relaxation (grade 1 diastolic dysfunction). Doppler parameters are consistent with high ventricular filling pressure.  Reviewed Myovue done 8/15  IMPRESSION: No evidence for pharmacological induced ischemia.  Fixed defect involving the inferolateral wall with associated wall motion abnormalities. Findings are suggestive for an area of infarct.  Calculated ejection fraction is 40%.  Dr Debara Pickett indicated medical Rx due to lack of chest pain and advanced CRF    Past Medical History  Diagnosis Date  . Hypertension   . Diabetes mellitus   . Anemia   . Hyperlipidemia   . Left ventricular hypertrophy   . Cataract     Left eye  . Glaucoma   . Chronic combined systolic and diastolic CHF (congestive heart failure) 11/11/2014  . Edema extremities 11/11/2014    Past Surgical History  Procedure Laterality Date  . Cholecystectomy        Current Outpatient Prescriptions  Medication Sig Dispense Refill  . aspirin EC 81 MG tablet Take 81 mg by mouth every morning.    . brimonidine-timolol (COMBIGAN) 0.2-0.5 % ophthalmic solution Place 1 drop into both eyes 3 (three) times daily. 1 Bottle 5  . carvedilol (COREG) 12.5 MG tablet Take 1 tablet (12.5 mg total) by mouth 2 (two) times daily with a meal. 60 tablet 0  . ferrous sulfate 325 (65 FE) MG tablet Take 1 tablet (325 mg total) by mouth 2 (two) times daily with a meal. 60 tablet 3  . furosemide (LASIX) 80 MG tablet Take 1 tablet (80 mg total) by mouth 2 (two) times daily. 60 tablet 0  . glimepiride (AMARYL) 1 MG tablet Take 1 tablet (1 mg total) by mouth daily before breakfast. 30 tablet 2  . glucose monitoring kit (FREESTYLE) monitoring kit 1 each by Does not apply route 4 (four) times daily - after meals and at bedtime. 1 month Diabetic Testing Supplies for QAC-QHS accuchecks. 1 each 1  . hydrALAZINE (APRESOLINE) 100 MG tablet Take 1 tablet (100 mg total) by mouth every 8 (eight) hours. 90 tablet 0  . isosorbide mononitrate (IMDUR) 60 MG 24 hr tablet Take 1 tablet (60 mg total) by mouth daily. 30 tablet 0  . metolazone (ZAROXOLYN) 2.5 MG tablet Take one tablet by mouth 30 minutes prior to your morning dose of Furosemide daily as needed for swelling 5 tablet 0  . pravastatin (  PRAVACHOL) 40 MG tablet Take 1 tablet (40 mg total) by mouth daily. 30 tablet 7   No current facility-administered medications for this visit.    Allergies:   Nitrofurantoin monohyd macro    Social History:  The patient  reports that she has quit smoking. She has never used smokeless tobacco. She reports that she does not drink alcohol or use illicit drugs.   Family History:  The patient's family history includes Diabetes in her mother; Diabetes type II in an other family member; Hypertension in an other family member.    ROS:  Please see the history of present illness.   Otherwise, review of  systems are positive for none.   All other systems are reviewed and negative.    PHYSICAL EXAM: VS:  BP 110/62 mmHg  Pulse 61  Ht _0  (1.549 m)  Wt 129 lb 11.2 oz (58.832 kg)  BMI 24.52 kg/m2  SpO2 97% , BMI Body mass index is 24.52 kg/(m^2). GEN: Well nourished, well developed, in no acute distress HEENT: normal Neck: no JVD, carotid bruits, or masses Cardiac: RRR; no murmurs, rubs, or gallops.  1-2+ LE edema  Respiratory:  clear to auscultation bilaterally, normal work of breathing GI: soft, nontender, nondistended, + BS MS: no deformity or atrophy Skin: warm and dry, no rash Neuro:  Strength and sensation are intact Psych: euthymic mood, full affect   EKG:  EKG is not ordered today.    Recent Labs: 10/14/2014: ALT 19; B Natriuretic Peptide >4500.0*; BUN 45*; Creatinine, Ser 3.65*; Hemoglobin 9.4*; Platelets 193; Potassium 3.6; Sodium 136    Lipid Panel    Component Value Date/Time   CHOL 215* 08/17/2013 0136   TRIG 98 08/17/2013 0136   HDL 75 08/17/2013 0136   CHOLHDL 2.9 08/17/2013 0136   VLDL 20 08/17/2013 0136   LDLCALC 120* 08/17/2013 0136      Wt Readings from Last 3 Encounters:  11/11/14 129 lb 11.2 oz (58.832 kg)  10/14/14 127 lb 14.4 oz (58.015 kg)  06/22/14 119 lb 9.6 oz (54.25 kg)    ASSESSMENT AND PLAN:  1.  CHF: EF 45% myovue no ischemia Continue hydralazine/nitrates/Coreg and current dose of lasix. She is scheduled to see her nephrologist in a few weeks.  She is getting labwork done today.  I am concerned that she may have worsening kidney function with volume retention.  I do not want to adjust her diuretics today without seeing blood work.  I have instructed her to get her blood work done today ordered by her nephrologist and then I will get her in to see him tomorrow.  I will check  2D echo to reassess LVF. 2.  CAD Presumed based on myovue No chest pain.  No cath due to renal failure.  Continue nitrates, asa, BB 3. CKD stage 4: F/u  nephrology not on ACe 4.  DM: Discussed low carb diet. Target hemoglobin A1c is 6.5 or less. Continue current medications.   Current medicines are reviewed at length with the patient today.  The patient does not have concerns regarding medicines.  The following changes have been made:  no change  Labs/ tests ordered today: See above Assessment and Plan No orders of the defined types were placed in this encounter.     Disposition:   FU with me in 6 months  Signed, Sueanne Margarita, MD  11/11/2014 11:37 AM    [facility] [place], [place], [place]  [address] Phone: [phone])  [phone]; Fax: [phone]    

## 2014-11-11 NOTE — Patient Instructions (Addendum)
Medication Instructions:  Your physician recommends that you continue on your current medications as directed. Please refer to the Current Medication list given to you today.   Labwork: None  Testing/Procedures: Your physician has requested that you have an echocardiogram. Echocardiography is a painless test that uses sound waves to create images of your heart. It provides your doctor with information about the size and shape of your heart and how well your heart's chambers and valves are working. This procedure takes approximately one hour. There are no restrictions for this procedure.  Follow-Up: Your physician recommends that you schedule a follow-up appointment with [facility] TOMORROW.  Your physician wants you to follow-up in: 6 months with Dr. Mayford Knife. You will receive a reminder letter in the mail two months in advance. If you don't receive a letter, please call our office to schedule the follow-up appointment.   Any Other Special Instructions Will Be Listed Below (If Applicable).

## 2014-11-17 ENCOUNTER — Ambulatory Visit (HOSPITAL_COMMUNITY): Payer: Commercial Managed Care - HMO | Attending: Cardiovascular Disease

## 2014-11-17 ENCOUNTER — Other Ambulatory Visit: Payer: Self-pay

## 2014-11-17 DIAGNOSIS — I517 Cardiomegaly: Secondary | ICD-10-CM | POA: Insufficient documentation

## 2014-11-17 DIAGNOSIS — I071 Rheumatic tricuspid insufficiency: Secondary | ICD-10-CM | POA: Insufficient documentation

## 2014-11-17 DIAGNOSIS — R600 Localized edema: Secondary | ICD-10-CM

## 2014-11-17 DIAGNOSIS — F17200 Nicotine dependence, unspecified, uncomplicated: Secondary | ICD-10-CM | POA: Insufficient documentation

## 2014-11-17 DIAGNOSIS — I313 Pericardial effusion (noninflammatory): Secondary | ICD-10-CM | POA: Diagnosis not present

## 2014-11-17 DIAGNOSIS — I5042 Chronic combined systolic (congestive) and diastolic (congestive) heart failure: Secondary | ICD-10-CM

## 2014-11-17 DIAGNOSIS — E785 Hyperlipidemia, unspecified: Secondary | ICD-10-CM | POA: Diagnosis not present

## 2014-11-17 DIAGNOSIS — I351 Nonrheumatic aortic (valve) insufficiency: Secondary | ICD-10-CM | POA: Diagnosis not present

## 2014-11-17 DIAGNOSIS — I340 Nonrheumatic mitral (valve) insufficiency: Secondary | ICD-10-CM | POA: Diagnosis not present

## 2014-11-17 DIAGNOSIS — I509 Heart failure, unspecified: Secondary | ICD-10-CM | POA: Insufficient documentation

## 2014-11-17 DIAGNOSIS — E119 Type 2 diabetes mellitus without complications: Secondary | ICD-10-CM | POA: Diagnosis not present

## 2014-11-17 DIAGNOSIS — N184 Chronic kidney disease, stage 4 (severe): Secondary | ICD-10-CM | POA: Insufficient documentation

## 2014-11-17 DIAGNOSIS — R609 Edema, unspecified: Secondary | ICD-10-CM | POA: Diagnosis not present

## 2014-11-17 DIAGNOSIS — I129 Hypertensive chronic kidney disease with stage 1 through stage 4 chronic kidney disease, or unspecified chronic kidney disease: Secondary | ICD-10-CM | POA: Diagnosis not present

## 2014-11-17 NOTE — Progress Notes (Signed)
Echo completed.  Her previous echo (08/17/13) showed an EF of 50%. Today's echo showed an EF of 30-35%. Spoke with the Dr. Ladona Ridgel (DOD) and said to have the patient followed up with Dr. Mayford Knife and send her home.    11/17/2014

## 2014-11-18 ENCOUNTER — Telehealth: Payer: Self-pay | Admitting: Cardiology

## 2014-11-18 NOTE — Telephone Encounter (Signed)
-----   Message from Quintella Reichert, MD sent at 11/18/2014 10:09 AM EDT ----- 2D echo shows severe LV dysfunction with EF 15% and severe pulmonary HTN.  Please set up appt with [facility] ASAP

## 2014-11-18 NOTE — Telephone Encounter (Signed)
F/u   Pt returning KAty's phone call concerning echo results. Please call back and discuss.

## 2014-11-18 NOTE — Telephone Encounter (Signed)
Informed patient of results and verbal understanding expressed.  Patient understands Dr. Alford Highland office will call to schedule appointment. Message sent to scheduler.

## 2014-11-25 ENCOUNTER — Telehealth (HOSPITAL_COMMUNITY): Payer: Self-pay | Admitting: Vascular Surgery

## 2014-11-25 NOTE — Telephone Encounter (Signed)
Left message to make New pt appt per Dr. Mayford Knife

## 2014-11-30 ENCOUNTER — Encounter: Payer: Self-pay | Admitting: Vascular Surgery

## 2014-12-02 ENCOUNTER — Ambulatory Visit (INDEPENDENT_AMBULATORY_CARE_PROVIDER_SITE_OTHER)
Admission: RE | Admit: 2014-12-02 | Discharge: 2014-12-02 | Disposition: A | Discharge status: Home / Self Care | Payer: Commercial Managed Care - HMO | Source: Ambulatory Visit | Attending: Vascular Surgery | Admitting: Vascular Surgery

## 2014-12-02 ENCOUNTER — Ambulatory Visit (HOSPITAL_COMMUNITY)
Admission: RE | Admit: 2014-12-02 | Discharge: 2014-12-02 | Disposition: A | Discharge status: Home / Self Care | Payer: Commercial Managed Care - HMO | Source: Ambulatory Visit | Attending: Vascular Surgery | Admitting: Vascular Surgery

## 2014-12-02 ENCOUNTER — Ambulatory Visit (INDEPENDENT_AMBULATORY_CARE_PROVIDER_SITE_OTHER): Payer: Commercial Managed Care - HMO | Admitting: Vascular Surgery

## 2014-12-02 ENCOUNTER — Encounter: Payer: Self-pay | Admitting: *Deleted

## 2014-12-02 ENCOUNTER — Encounter: Payer: Self-pay | Admitting: Vascular Surgery

## 2014-12-02 ENCOUNTER — Other Ambulatory Visit: Payer: Self-pay | Admitting: Vascular Surgery

## 2014-12-02 VITALS — BP 140/80 | HR 67 | Ht 61.0 in | Wt 133.1 lb

## 2014-12-02 DIAGNOSIS — Z01810 Encounter for preprocedural cardiovascular examination: Secondary | ICD-10-CM

## 2014-12-02 DIAGNOSIS — N185 Chronic kidney disease, stage 5: Secondary | ICD-10-CM | POA: Diagnosis not present

## 2014-12-02 NOTE — Progress Notes (Signed)
HISTORY AND PHYSICAL     CC:  In need of permanent HD access Referring Provider:  [name], MD  HPI: This is a 64 y.o. 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 Joelyn Oms.  She is not yet on HD and does not know when she will be on HD.  She states that she does get some swelling in her legs during the day.  This is improved in the mornings when she wakes.  She does have a hx of CHF.    She states that she does have some pain in her legs when walking.  This occurs when she gets up after sitting for a while and it does improve as she walks more.   She does take a beta blocker for her hypertension.  She is on a daily baby aspirin.   She does take a statin for cholesterol management.  She is on Amaryl for her diabetes.    Past Medical History  Diagnosis Date  . Hypertension   . Diabetes mellitus   . Anemia   . Hyperlipidemia   . Left ventricular hypertrophy   . Cataract     Left eye  . Glaucoma   . Chronic combined systolic and diastolic CHF (congestive heart failure) 11/11/2014  . Edema extremities 11/11/2014    Past Surgical History  Procedure Laterality Date  . Cholecystectomy      Allergies  Allergen Reactions  . Nitrofurantoin Monohyd Macro Nausea Only    Current Outpatient Prescriptions  Medication Sig Dispense Refill  . aspirin EC 81 MG tablet Take 81 mg by mouth every morning.    . brimonidine-timolol (COMBIGAN) 0.2-0.5 % ophthalmic solution Place 1 drop into both eyes 3 (three) times daily. 1 Bottle 5  . carvedilol (COREG) 12.5 MG tablet Take 1 tablet (12.5 mg total) by mouth 2 (two) times daily with a meal. 60 tablet 0  . ferrous sulfate 325 (65 FE) MG tablet Take 1 tablet (325 mg total) by mouth 2 (two) times daily with a meal. 60 tablet 3  . furosemide (LASIX) 80 MG tablet Take 1 tablet (80 mg total) by mouth 2 (two) times daily. 60 tablet 0  . glimepiride (AMARYL) 1 MG tablet  Take 1 tablet (1 mg total) by mouth daily before breakfast. 30 tablet 2  . glucose monitoring kit (FREESTYLE) monitoring kit 1 each by Does not apply route 4 (four) times daily - after meals and at bedtime. 1 month Diabetic Testing Supplies for QAC-QHS accuchecks. 1 each 1  . hydrALAZINE (APRESOLINE) 100 MG tablet Take 1 tablet (100 mg total) by mouth every 8 (eight) hours. 90 tablet 0  . isosorbide mononitrate (IMDUR) 60 MG 24 hr tablet Take 1 tablet (60 mg total) by mouth daily. 30 tablet 0  . metolazone (ZAROXOLYN) 2.5 MG tablet Take one tablet by mouth 30 minutes prior to your morning dose of Furosemide daily as needed for swelling 5 tablet 0  . pravastatin (PRAVACHOL) 40 MG tablet Take 1 tablet (40 mg total) by mouth daily. 30 tablet 7   No current facility-administered medications for this visit.    Family History  Problem Relation Age of Onset  . Diabetes type II    . Hypertension    . Diabetes Mother     Social History   Social History  . Marital Status: Married    Spouse Name: N/A  . Number of Children: N/A  .  Years of Education: N/A   Occupational History  . Not on file.   Social History Main Topics  . Smoking status: Former Research scientist (life sciences)  . Smokeless tobacco: Never Used  . Alcohol Use: No  . Drug Use: No  . Sexual Activity: Not on file   Other Topics Concern  . Not on file   Social History Narrative     ROS: _0  Positive   _1  Negative   _2  All sytems reviewed and are negative  Cardiovascular: _3  chest pain/pressure _4  palpitations _5  SOB lying flat _6  DOE _7  pain in legs while walking-see HPI _8  pain in feet when lying flat _9  hx of DVT _10  hx of phlebitis _11  swelling in legs _12  varicose veins  Pulmonary: _13  productive cough _14  asthma _15  wheezing  Neurologic: _16  weakness in _17  arms _18  legs _19  numbness in _20  arms _21  legs _22 difficulty speaking or slurred speech _23  temporary loss of vision in one eye _24  dizziness  Hematologic: _25  bleeding  problems _26  problems with blood clotting easily  GI _27  vomiting blood _28  blood in stool  GU: _29  burning with urination _30  blood in urine  Psychiatric: _31  hx of major depression  Integumentary: _32  rashes _33  ulcers  Constitutional: _34  fever _35  chills   PHYSICAL EXAMINATION:  Filed Vitals:   12/02/14 1524  BP: 140/80  Pulse: 67   Body mass index is 25.16 kg/(m^2).  General:  WDWN in NAD Gait: Not observed HENT: WNL, normocephalic Pulmonary: normal non-labored breathing , without Rales, rhonchi,  wheezing Cardiac: RRR, without  Murmurs, rubs or gallops; without carotid bruits Abdomen: soft, NT, no masses Skin: without rashes, without ulcers  Vascular Exam/Pulses:  Right Left  Radial 2+ (normal) 2+ (normal)  Ulnar Unable to palpate  1+ (weak)  DP 2+ (normal) 1+ (weak)  PT Unable to palpate  Unable to palpate    Extremities: without ischemic changes, without Gangrene , without cellulitis; without open wounds; +BLE edema Musculoskeletal: no muscle wasting or atrophy  Neurologic: A&O X 3; Appropriate Affect ; SENSATION: normal; MOTOR FUNCTION:  moving all extremities equally. Speech is fluent/normal   Non-Invasive Vascular Imaging:   Bilateral upper extremity vein mapping duplex 6/38/46: Right cephalic vein:  6.59DJ-5.70VX Right basilic vein:  7.93JQ-3.00PQ  Left cephalic vein:  3.30QT-6.22QJ Left basilic vein:  3.35KT-6.25WL  Bilateral upper extremity arterial duplex 12/02/14: Right:  0.22cm-0.47cm  Triphasic Left:  0.29cm-0.44cm  Triphasic  Pt meds includes: Statin:  Yes.   Beta Blocker:  Yes.   Aspirin:  Yes.   ACEI:  No. ARB:  No. Other Antiplatelet/Anticoagulant:  No.    ASSESSMENT/PLAN:: 64 y.o. female with CKD 5 in need of permanent HD access   -the pt is right hand dominant -she does appear to have adequate vein for a left radial cephalic AVF -will plan on creation of fistula on 12/07/14 -she does have hx of CHF-she appears to be doing  quite well today.  She does have f/u appt with Dr. Rhea Belton, PA-C [facility] [phone]  Clinic MD:  Pt seen and examined in conjunction with Dr. Oneida Alar  History and exam details as above. Patient seems to be a good candidate for left radiocephalic AV fistula. Risks benefits possible, the patient and procedure details were to find the patient and her husband today. Plan will be to place her fistula on Tuesday, 12/07/2014.  Ruta Hinds, MD [facility] [place] Office: [phone] Pager: [phone]

## 2014-12-03 ENCOUNTER — Other Ambulatory Visit: Payer: Self-pay | Admitting: *Deleted

## 2014-12-06 ENCOUNTER — Encounter (HOSPITAL_COMMUNITY): Payer: Self-pay | Admitting: Vascular Surgery

## 2014-12-06 MED ORDER — DEXTROSE 5 % IV SOLN: 1.5000 g | INTRAVENOUS | Status: DC

## 2014-12-06 NOTE — Progress Notes (Signed)
Anesthesia Chart Review: SAME DAY WORK-UP. Our PAT RN has not been able to reach yet for her phone interview.  Patient is a [name] scheduled for left radiocephalic AVF tomorrow by Dr. Darrick Penna. Case is posted for MAC anesthesia.  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 Nephrologist is Dr. Marisue Humble (scanned note 10/18/14 indicated there was no indication to start hemodialysis at that time). Cardiologist is Dr. Mayford Knife, last visit 11/12/14 (as urgent visit for new SOB wthin 24 hours) with echo ordered showing a drop in her EF from 45-50% to 10-15% since last year and evidence of severe pulmonary hypertension (see below). She referred patient to the [facility] asap, but the appointment has not occured yet (scheduled for 12/14/14).   Meds includes ASA 81 mg, Combigan ophthalmic, Coreg, 65 Fe, Lasix, Amaryl, hydralazine, Imdur, Zaroxlyn, pravastatin.  11/17/14 Echo: Study Conclusions - Left ventricle: The cavity size was normal. Wall thickness was increased in a pattern of mild LVH. Systolic function was severely reduced. The estimated ejection fraction was in the range of 10% to 15%. Features are consistent with a pseudonormal left ventricular filling pattern, with concomitant abnormalrelaxation and increased filling pressure (grade 2 diastolic dysfunction). No evidence of thrombus. - Aortic valve: There was mild regurgitation. - Mitral valve: There was mild to moderate regurgitation directed centrally. - Left atrium: The atrium was mildly dilated. - Right ventricle: The cavity size was mildly dilated. Wall thickness was normal. Systolic function was mildly reduced. - Right atrium: The atrium was mildly dilated. - Tricuspid valve: There was malcoaptation of the valve leaflets. There was severe  regurgitation. - Pulmonary arteries: PA peak pressure: 97 mm Hg (S). - Pericardium, extracardiac: A trivial pericardial effusion wasidentified. (08/17/13 Echo: EF 45-50%, grade 1 diastolic dysfunction, trivial MR.)  10/24/13 Nuclear stress test: IMPRESSION: No evidence for pharmacological induced ischemia. Fixed defect involving the inferolateral wall with associated wall motion abnormalities. Findings are suggestive for an area of infarct. Calculated ejection fraction is 40%. (By notes, cardiologist Dr. Rennis Golden recommended medical therapy due to lack of chest pain and advanced CRF at that time.)  10/15/14 EKG: SR, right BBB, LAD consider LAFB, prolonged QT interval, baseline wanderer in V6. Lateral T wave abnormality, most notable in high lateral leads, consider ischemia or repolarization abnormality.  10/15/14 CXR: IMPRESSION: Probable congestive heart failure with interstitial and alveolar edema. Very small effusions.  Labs will be done pre-operatively.  Discussed above with anesthesiologist Dr. Sandford Craze. Need to touch base with Dr. Mayford Knife regarding if she feels patient should be seen at the Heart Failure clinic prior to proceeding with elective AVF, proceed with surgery at increased risk if no acute CHF symptoms, or consider getting tunneled hemodialysis catheter first if starting hemodialysis was felt very imminent and likely to improve her CHF management (would need to touch base with surgeon/nephrologist as well). I called and spoke with Dr. Norris Cross nurse Florentina Addison. She will have Dr. Mayford Knife review for input.  Velna Ochs [facility] Short Stay Center/Anesthesiology Phone [phone] 12/06/2014 11:41 AM  Addendum: I received a phone call from Dr. Mayford Knife. She has recommended that patient be seen at the [facility] for additional recommendations before undergoing HD access procedure. [facility] RN Judeth Cornfield notified. She will update Dr. Darrick Penna (who did speak with Dr. Jean Rosenthal about our concerns  earlier today)  and notify the patient.  Velna Ochs [facility] Short Stay Center/Anesthesiology Phone [phone] 12/06/2014 4:42 PM

## 2014-12-07 ENCOUNTER — Encounter (HOSPITAL_COMMUNITY): Admission: RE | Payer: Self-pay | Source: Ambulatory Visit

## 2014-12-07 ENCOUNTER — Ambulatory Visit (HOSPITAL_COMMUNITY)
Admission: RE | Admit: 2014-12-07 | Payer: Commercial Managed Care - HMO | Source: Ambulatory Visit | Admitting: Vascular Surgery

## 2014-12-07 SURGERY — ARTERIOVENOUS (AV) FISTULA CREATION
Anesthesia: Monitor Anesthesia Care | Site: Arm Lower | Laterality: Left

## 2014-12-14 ENCOUNTER — Ambulatory Visit (HOSPITAL_COMMUNITY)
Admission: RE | Admit: 2014-12-14 | Discharge: 2014-12-14 | Disposition: A | Discharge status: Home / Self Care | Payer: Commercial Managed Care - HMO | Source: Ambulatory Visit | Attending: Internal Medicine | Admitting: Internal Medicine

## 2014-12-14 ENCOUNTER — Other Ambulatory Visit (HOSPITAL_COMMUNITY): Payer: Self-pay | Admitting: Cardiology

## 2014-12-14 VITALS — BP 128/58 | HR 53 | Wt 130.0 lb

## 2014-12-14 DIAGNOSIS — I272 Other secondary pulmonary hypertension: Secondary | ICD-10-CM | POA: Insufficient documentation

## 2014-12-14 DIAGNOSIS — Z7982 Long term (current) use of aspirin: Secondary | ICD-10-CM | POA: Insufficient documentation

## 2014-12-14 DIAGNOSIS — Z8249 Family history of ischemic heart disease and other diseases of the circulatory system: Secondary | ICD-10-CM | POA: Diagnosis not present

## 2014-12-14 DIAGNOSIS — I5022 Chronic systolic (congestive) heart failure: Secondary | ICD-10-CM | POA: Insufficient documentation

## 2014-12-14 DIAGNOSIS — I5042 Chronic combined systolic (congestive) and diastolic (congestive) heart failure: Secondary | ICD-10-CM | POA: Diagnosis not present

## 2014-12-14 DIAGNOSIS — Z833 Family history of diabetes mellitus: Secondary | ICD-10-CM | POA: Insufficient documentation

## 2014-12-14 DIAGNOSIS — E785 Hyperlipidemia, unspecified: Secondary | ICD-10-CM | POA: Diagnosis not present

## 2014-12-14 DIAGNOSIS — N184 Chronic kidney disease, stage 4 (severe): Secondary | ICD-10-CM

## 2014-12-14 DIAGNOSIS — Z79899 Other long term (current) drug therapy: Secondary | ICD-10-CM | POA: Insufficient documentation

## 2014-12-14 DIAGNOSIS — E1122 Type 2 diabetes mellitus with diabetic chronic kidney disease: Secondary | ICD-10-CM | POA: Insufficient documentation

## 2014-12-14 DIAGNOSIS — I739 Peripheral vascular disease, unspecified: Secondary | ICD-10-CM

## 2014-12-14 DIAGNOSIS — M79606 Pain in leg, unspecified: Secondary | ICD-10-CM | POA: Diagnosis not present

## 2014-12-14 DIAGNOSIS — I429 Cardiomyopathy, unspecified: Secondary | ICD-10-CM | POA: Diagnosis not present

## 2014-12-14 DIAGNOSIS — I129 Hypertensive chronic kidney disease with stage 1 through stage 4 chronic kidney disease, or unspecified chronic kidney disease: Secondary | ICD-10-CM | POA: Diagnosis not present

## 2014-12-14 LAB — BASIC METABOLIC PANEL
ANION GAP: 9 (ref 5–15)
BUN: 79 mg/dL — ABNORMAL HIGH (ref 6–20)
CALCIUM: 8.7 mg/dL — AB (ref 8.9–10.3)
CO2: 24 mmol/L (ref 22–32)
Chloride: 101 mmol/L (ref 101–111)
Creatinine, Ser: 4.02 mg/dL — ABNORMAL HIGH (ref 0.44–1.00)
GFR calc Af Amer: 12 mL/min — ABNORMAL LOW (ref >60)
GFR, EST NON AFRICAN AMERICAN: 11 mL/min — AB (ref >60)
GLUCOSE: 105 mg/dL — AB (ref 65–99)
Potassium: 3.6 mmol/L (ref 3.5–5.1)
Sodium: 134 mmol/L — ABNORMAL LOW (ref 135–145)

## 2014-12-14 LAB — BRAIN NATRIURETIC PEPTIDE: B Natriuretic Peptide: >4500 pg/mL — ABNORMAL HIGH (ref 0.0–100.0)

## 2014-12-14 MED ORDER — ISOSORBIDE MONONITRATE ER 60 MG PO TB24: 90.0000 mg | ORAL_TABLET | Freq: Every day | ORAL | Status: AC

## 2014-12-14 MED ORDER — METOLAZONE 2.5 MG PO TABS: 2.5000 mg | ORAL_TABLET | Freq: Every day | ORAL | Status: AC

## 2014-12-14 MED ORDER — TORSEMIDE 20 MG PO TABS: 80.0000 mg | ORAL_TABLET | Freq: Every day | ORAL | Status: DC

## 2014-12-14 NOTE — Patient Instructions (Addendum)
Stop Furosemide  Start Torsemide 80 mg (4 tabs) daily  Decrease Metolazone to 2.5 mg DAILY  Increase Imdur (Isosorbide) to 90 mg (1 & 1/2 tabs) daily  Labs today  We have provided you a prescription for compression stockings, please wear during the day.  Put them on as soon as you get up in the morning and remove at night  Your physician has requested that you have a lower or upper extremity arterial duplex. This test is an ultrasound of the arteries in the legs or arms. It looks at arterial blood flow in the legs and arms. Allow one hour for Lower and Upper Arterial scans. There are no restrictions or special instructions  Your physician recommends that you schedule a follow-up appointment in: 1 week

## 2014-12-14 NOTE — Progress Notes (Signed)
Patient ID: MARGARETHE VIRGEN, female   DOB: [birth year], 64 y.o.   MRN: [id number] PCP: Dr. Annitta Needs Nephrology: Dr. Joelyn Oms Primary cardiologist: Dr. Radford Pax HF cardiologist: Aundra Dubin  64 yo with HTN, DM2, CKD stage IV and chronic systolic CHF presents for CHF clinic assessment.  She has been seen by Dr Radford Pax in the past.  Recently, she has had a lot of trouble with volume overload.  She has marked lower extremity edema.  She can walk around the house without dyspnea but her ambulation is very limited by pain in thighs and hips. No orthopnea/PND.  No chest pain.  No lightheadedness or palpitations.  She is followed by Dr Joelyn Oms for nephrology and needs construction of AV fistula.    In terms of her cardiomyopathy, she had a Cardiolite in 7/15 with a fixed inferolateral defect and no ischemia, EF 40%.  She did not have cardiac cath in the past due to renal disease and lack of chest pain.  Echo in 6/15 showed EF 45-50%.  However, echo in 8/16 showed EF down to 10-15% with severe TR and severe pulmonary hypertension.  She had an admission in 4/16 for CHF.  BP historically has been poorly controlled but is doing better now.   ECG (7/16): NSR, RBBB  Labs (7/16): K 3.6, creatinine 4.11 => 3.65  PMH: 1. Anemia of renal disease. 2. CKD stage IV: From HTN and diabetes. 3. Hyperlipidemia 4. HTN 5. Type II diabetes 6. H/o cholecystectomy. 7. Cardiomyopathy: Cardiolite in 8/15 with fixed inferolateral defect consistent with infarction, no ischemia, EF 40%.  Echo (6/15) with EF 45-50%.  Echo (8/16) with EF 10-15%, mild LVH, grade II diastolic dysfunction, mild-moderate MR, mildly dilated RV with mildly decreased systolic function, PA systolic pressure 97 mmHg, severe TR.   SH: Married, nonsmoker, lives in [place].  FH: Diabetes, HTN, no CHF or CAD.   ROS: All systems reviewed and negative except as per HPI.   Current Outpatient Prescriptions  Medication Sig Dispense Refill  . aspirin EC 81 MG tablet  Take 81 mg by mouth every morning.    . brimonidine-timolol (COMBIGAN) 0.2-0.5 % ophthalmic solution Place 1 drop into both eyes 3 (three) times daily. 1 Bottle 5  . carvedilol (COREG) 12.5 MG tablet Take 1 tablet (12.5 mg total) by mouth 2 (two) times daily with a meal. 60 tablet 0  . ferrous sulfate 325 (65 FE) MG tablet Take 1 tablet (325 mg total) by mouth 2 (two) times daily with a meal. 60 tablet 3  . glimepiride (AMARYL) 1 MG tablet Take 1 tablet (1 mg total) by mouth daily before breakfast. 30 tablet 2  . glucose monitoring kit (FREESTYLE) monitoring kit 1 each by Does not apply route 4 (four) times daily - after meals and at bedtime. 1 month Diabetic Testing Supplies for QAC-QHS accuchecks. 1 each 1  . hydrALAZINE (APRESOLINE) 100 MG tablet Take 1 tablet (100 mg total) by mouth every 8 (eight) hours. 90 tablet 0  . isosorbide mononitrate (IMDUR) 60 MG 24 hr tablet Take 1.5 tablets (90 mg total) by mouth daily. 45 tablet 3  . metolazone (ZAROXOLYN) 2.5 MG tablet Take 1 tablet (2.5 mg total) by mouth daily. 30 tablet 3  . pravastatin (PRAVACHOL) 40 MG tablet Take 1 tablet (40 mg total) by mouth daily. 30 tablet 7  . torsemide (DEMADEX) 20 MG tablet Take 4 tablets (80 mg total) by mouth daily. 120 tablet 3   No current facility-administered medications for this encounter.  BP 128/58 mmHg  Pulse 53  Wt 130 lb (58.968 kg)  SpO2 95% General: NAD Neck: JVP 14-16 cm, no thyromegaly or thyroid nodule.  Lungs: Clear to auscultation bilaterally with normal respiratory effort. CV: Nondisplaced PMI.  Heart regular S1/S2, no S3/S4, 3/6 HSM LLSB.  2+ edema to thighs.  No carotid bruit.  Unable to palpate pedal pulses.  Abdomen: Soft, nontender, no hepatosplenomegaly, no distention.  Skin: Intact without lesions or rashes.  Neurologic: Alert and oriented x 3.  Psych: Normal affect. Extremities: No clubbing or cyanosis.  HEENT: Normal.   Assessment/Plan: 1. Chronic systolic CHF: Uncertain  etiology.  Possible ischemic cardiomyopathy based on prior Cardiolite, but has never had catheterization due to CKD and lack of chest pain/no ischemia on Cardiolite.  EF 10-15% with severe pulmonary hypertension and severe TR on 8/16 echo.  On exam today, she is markedly volume overloaded.  NYHA class II-III symptoms.  She is already on high diuretic doses (taking metolazone bid).  Renal function may be the rate-limiting process here.  - Stop Lasix.   - Torsemide 80 mg daily.  - Take metolazone 2.5 mg once daily (decrease from bid).   - BMET/BNP now and in 1 week at followup. - Wear graded compression stockings during the day.  - Continue Coreg and hydralazine, increase Imdur to 90 mg daily.  - If she goes on HD in the future, would consider cardiac cath to define coronary disease.  2. Leg pain: I am unable to palpate pedal pulses.  This seems to be her main limitation when it comes to walking.  I will arrange for peripheral arterial dopplers.   3. CKD stage IV: From HTN and DM2.  Followed by Dr Joelyn Oms.  This greatly complicates our ability to diurese her. Follow BMET closely with diuresis.    Loralie Champagne 12/14/2014

## 2014-12-15 ENCOUNTER — Telehealth (HOSPITAL_COMMUNITY): Payer: Self-pay | Admitting: *Deleted

## 2014-12-15 NOTE — Telephone Encounter (Signed)
Pt needs clearance for a left radius cephalic artieral/vein fissula.

## 2014-12-16 ENCOUNTER — Other Ambulatory Visit: Payer: Self-pay | Admitting: Internal Medicine

## 2014-12-17 ENCOUNTER — Inpatient Hospital Stay (HOSPITAL_COMMUNITY): Admission: RE | Admit: 2014-12-17 | Payer: Self-pay | Source: Ambulatory Visit

## 2014-12-17 ENCOUNTER — Ambulatory Visit (HOSPITAL_COMMUNITY)
Admission: RE | Admit: 2014-12-17 | Discharge: 2014-12-17 | Disposition: A | Discharge status: Home / Self Care | Payer: Commercial Managed Care - HMO | Source: Ambulatory Visit | Attending: Cardiology | Admitting: Cardiology

## 2014-12-17 ENCOUNTER — Other Ambulatory Visit: Payer: Self-pay | Admitting: Cardiology

## 2014-12-17 DIAGNOSIS — E785 Hyperlipidemia, unspecified: Secondary | ICD-10-CM | POA: Diagnosis not present

## 2014-12-17 DIAGNOSIS — I10 Essential (primary) hypertension: Secondary | ICD-10-CM | POA: Diagnosis not present

## 2014-12-17 DIAGNOSIS — I739 Peripheral vascular disease, unspecified: Secondary | ICD-10-CM | POA: Insufficient documentation

## 2014-12-17 DIAGNOSIS — E119 Type 2 diabetes mellitus without complications: Secondary | ICD-10-CM | POA: Insufficient documentation

## 2014-12-21 ENCOUNTER — Encounter (HOSPITAL_COMMUNITY): Payer: Self-pay

## 2014-12-23 ENCOUNTER — Ambulatory Visit (HOSPITAL_COMMUNITY)
Admission: RE | Admit: 2014-12-23 | Discharge: 2014-12-23 | Disposition: A | Discharge status: Home / Self Care | Payer: Commercial Managed Care - HMO | Source: Ambulatory Visit | Attending: Cardiology | Admitting: Cardiology

## 2014-12-23 ENCOUNTER — Encounter (HOSPITAL_COMMUNITY): Payer: Self-pay

## 2014-12-23 VITALS — BP 152/98 | HR 83 | Wt 124.4 lb

## 2014-12-23 DIAGNOSIS — I5042 Chronic combined systolic (congestive) and diastolic (congestive) heart failure: Secondary | ICD-10-CM

## 2014-12-23 DIAGNOSIS — I429 Cardiomyopathy, unspecified: Secondary | ICD-10-CM | POA: Insufficient documentation

## 2014-12-23 DIAGNOSIS — Z79899 Other long term (current) drug therapy: Secondary | ICD-10-CM | POA: Diagnosis not present

## 2014-12-23 DIAGNOSIS — E785 Hyperlipidemia, unspecified: Secondary | ICD-10-CM | POA: Insufficient documentation

## 2014-12-23 DIAGNOSIS — M79606 Pain in leg, unspecified: Secondary | ICD-10-CM | POA: Insufficient documentation

## 2014-12-23 DIAGNOSIS — Z7982 Long term (current) use of aspirin: Secondary | ICD-10-CM | POA: Insufficient documentation

## 2014-12-23 DIAGNOSIS — Z7984 Long term (current) use of oral hypoglycemic drugs: Secondary | ICD-10-CM | POA: Diagnosis not present

## 2014-12-23 DIAGNOSIS — I509 Heart failure, unspecified: Secondary | ICD-10-CM

## 2014-12-23 DIAGNOSIS — E1122 Type 2 diabetes mellitus with diabetic chronic kidney disease: Secondary | ICD-10-CM | POA: Insufficient documentation

## 2014-12-23 DIAGNOSIS — I5022 Chronic systolic (congestive) heart failure: Secondary | ICD-10-CM | POA: Insufficient documentation

## 2014-12-23 DIAGNOSIS — N184 Chronic kidney disease, stage 4 (severe): Secondary | ICD-10-CM

## 2014-12-23 DIAGNOSIS — I272 Other secondary pulmonary hypertension: Secondary | ICD-10-CM | POA: Insufficient documentation

## 2014-12-23 DIAGNOSIS — I129 Hypertensive chronic kidney disease with stage 1 through stage 4 chronic kidney disease, or unspecified chronic kidney disease: Secondary | ICD-10-CM | POA: Insufficient documentation

## 2014-12-23 LAB — BASIC METABOLIC PANEL
ANION GAP: 10 (ref 5–15)
BUN: 75 mg/dL — ABNORMAL HIGH (ref 6–20)
CALCIUM: 8.9 mg/dL (ref 8.9–10.3)
CO2: 24 mmol/L (ref 22–32)
Chloride: 102 mmol/L (ref 101–111)
Creatinine, Ser: 3.48 mg/dL — ABNORMAL HIGH (ref 0.44–1.00)
GFR, EST AFRICAN AMERICAN: 15 mL/min — AB (ref >60)
GFR, EST NON AFRICAN AMERICAN: 13 mL/min — AB (ref >60)
Glucose, Bld: 127 mg/dL — ABNORMAL HIGH (ref 65–99)
Potassium: 3.4 mmol/L — ABNORMAL LOW (ref 3.5–5.1)
Sodium: 136 mmol/L (ref 135–145)

## 2014-12-23 MED ORDER — CARVEDILOL 12.5 MG PO TABS: 18.7500 mg | ORAL_TABLET | Freq: Two times a day (BID) | ORAL | Status: AC

## 2014-12-23 MED ORDER — TORSEMIDE 20 MG PO TABS: 100.0000 mg | ORAL_TABLET | Freq: Every day | ORAL | Status: AC

## 2014-12-23 NOTE — Patient Instructions (Signed)
INCREASE Torsemide to 100 mg (5 tabs) daily INCREASE Carvedilol to 18.75 mg,(one and one half tab) twice a day  Labs today  Your physician recommends that you schedule a follow-up appointment in:  1 month  Do the following things EVERYDAY: 1) Weigh yourself in the morning before breakfast. Write it down and keep it in a log. 2) Take your medicines as prescribed 3) Eat low salt foods-Limit salt (sodium) to 2000 mg per day.  4) Stay as active as you can everyday 5) Limit all fluids for the day to less than 2 liters 6)

## 2014-12-23 NOTE — Telephone Encounter (Signed)
Cleared for AV fistula  Spoke with Judeth Cornfield at [facility]

## 2014-12-23 NOTE — Telephone Encounter (Signed)
OK now for AV fistula.  Will be higher risk given CHF but volume status is improved compared to last appointment.

## 2014-12-24 ENCOUNTER — Other Ambulatory Visit: Payer: Self-pay

## 2014-12-24 DIAGNOSIS — E139 Other specified diabetes mellitus without complications: Secondary | ICD-10-CM

## 2014-12-24 MED ORDER — GLIMEPIRIDE 1 MG PO TABS: 1.0000 mg | ORAL_TABLET | Freq: Every day | ORAL | Status: AC

## 2014-12-25 ENCOUNTER — Ambulatory Visit (INDEPENDENT_AMBULATORY_CARE_PROVIDER_SITE_OTHER): Payer: Commercial Managed Care - HMO

## 2014-12-25 ENCOUNTER — Ambulatory Visit (INDEPENDENT_AMBULATORY_CARE_PROVIDER_SITE_OTHER): Payer: Commercial Managed Care - HMO | Admitting: Family Medicine

## 2014-12-25 ENCOUNTER — Telehealth: Payer: Self-pay | Admitting: *Deleted

## 2014-12-25 VITALS — BP 152/86 | HR 73 | Temp 98.0°F | Resp 18 | Ht 61.0 in | Wt 127.0 lb

## 2014-12-25 DIAGNOSIS — N184 Chronic kidney disease, stage 4 (severe): Secondary | ICD-10-CM

## 2014-12-25 DIAGNOSIS — M5441 Lumbago with sciatica, right side: Secondary | ICD-10-CM | POA: Diagnosis not present

## 2014-12-25 DIAGNOSIS — E1122 Type 2 diabetes mellitus with diabetic chronic kidney disease: Secondary | ICD-10-CM

## 2014-12-25 DIAGNOSIS — M5136 Other intervertebral disc degeneration, lumbar region: Secondary | ICD-10-CM | POA: Diagnosis not present

## 2014-12-25 DIAGNOSIS — E1165 Type 2 diabetes mellitus with hyperglycemia: Secondary | ICD-10-CM

## 2014-12-25 DIAGNOSIS — I5042 Chronic combined systolic (congestive) and diastolic (congestive) heart failure: Secondary | ICD-10-CM | POA: Diagnosis not present

## 2014-12-25 DIAGNOSIS — IMO0002 Reserved for concepts with insufficient information to code with codable children: Secondary | ICD-10-CM

## 2014-12-25 LAB — POCT CBC
GRANULOCYTE PERCENT: 56.3 % (ref 37–80)
HCT, POC: 32.6 % — AB (ref 37.7–47.9)
HEMOGLOBIN: 9.7 g/dL — AB (ref 12.2–16.2)
Lymph, poc: 1 (ref 0.6–3.4)
MCH: 24.1 pg — AB (ref 27–31.2)
MCHC: 29.7 g/dL — AB (ref 31.8–35.4)
MCV: 81 fL (ref 80–97)
MID (CBC): 0.4 (ref 0–0.9)
MPV: 7.6 fL (ref 0–99.8)
PLATELET COUNT, POC: 196 10*3/uL (ref 142–424)
POC Granulocyte: 1.8 — AB (ref 2–6.9)
POC LYMPH PERCENT: 29.8 %L (ref 10–50)
POC MID %: 13.9 %M — AB (ref 0–12)
RBC: 4.03 M/uL — AB (ref 4.04–5.48)
RDW, POC: 15.9 %
WBC: 3.2 10*3/uL — AB (ref 4.6–10.2)

## 2014-12-25 LAB — POCT URINALYSIS DIP (MANUAL ENTRY)
Bilirubin, UA: NEGATIVE
Glucose, UA: NEGATIVE
Ketones, POC UA: NEGATIVE
Leukocytes, UA: NEGATIVE
NITRITE UA: NEGATIVE
PH UA: 5
SPEC GRAV UA: 1.015
UROBILINOGEN UA: 0.2

## 2014-12-25 LAB — POC MICROSCOPIC URINALYSIS (UMFC): Mucus: ABSENT

## 2014-12-25 MED ORDER — TRAMADOL HCL 50 MG PO TABS: 50.0000 mg | ORAL_TABLET | Freq: Two times a day (BID) | ORAL | Status: AC | PRN

## 2014-12-25 MED ORDER — TRAMADOL HCL 50 MG PO TABS: 50.0000 mg | ORAL_TABLET | Freq: Three times a day (TID) | ORAL | Status: DC | PRN

## 2014-12-25 NOTE — Progress Notes (Signed)
Patient ID: [name], female   DOB: 01-30-1951, 64 y.o.   MRN: [id number] PCP: Dr. Annitta Needs Nephrology: Dr. Joelyn Oms Primary cardiologist: Dr. Radford Pax HF cardiologist: Aundra Dubin  64 yo with HTN, DM2, CKD stage IV and chronic systolic CHF presents for CHF clinic assessment.  Recently, she has had a lot of trouble with volume overload.  At last appointment, she had marked lower extremity edema.  She can walk around the house without dyspnea but her ambulation is very limited by pain in thighs and hips. Recent arterial dopplers were not particularly abnormal.  No orthopnea/PND.  No chest pain.  No lightheadedness or palpitations.  She is followed by Dr Joelyn Oms for nephrology and needs construction of AV fistula.    In terms of her cardiomyopathy, she had a Cardiolite in 7/15 with a fixed inferolateral defect and no ischemia, EF 40%.  She did not have cardiac cath in the past due to renal disease and lack of chest pain.  Echo in 6/15 showed EF 45-50%.  However, echo in 8/16 showed EF down to 10-15% with severe TR and severe pulmonary hypertension.  She had an admission in 4/16 for CHF.  BP historically has been poorly controlled but is doing better now.   At last appointment, I changed her diuretic to torsemide and cut her metolazone back to once a day.  Weight is down 6 lbs and she does feel better.  Less LE edema.    ECG (7/16): NSR, RBBB  Labs (7/16): K 3.6, creatinine 4.11 => 3.65 Labs (9/16): K 3.6, creatinine 4.02, BNP > 4500  PMH: 1. Anemia of renal disease. 2. CKD stage IV: From HTN and diabetes. 3. Hyperlipidemia 4. HTN 5. Type II diabetes 6. H/o cholecystectomy. 7. Cardiomyopathy: Cardiolite in 8/15 with fixed inferolateral defect consistent with infarction, no ischemia, EF 40%.  Echo (6/15) with EF 45-50%.  Echo (8/16) with EF 10-15%, mild LVH, grade II diastolic dysfunction, mild-moderate MR, mildly dilated RV with mildly decreased systolic function, PA systolic pressure 97 mmHg,  severe TR.  8. Leg pain: ABIs (9/16) 0.96 on right, 0.84 on left; distal aorta and bilateral iliacs patent.   SH: Married, nonsmoker, lives in [place].  FH: Diabetes, HTN, no CHF or CAD.   ROS: All systems reviewed and negative except as per HPI.   Current Outpatient Prescriptions  Medication Sig Dispense Refill  . aspirin EC 81 MG tablet Take 81 mg by mouth every morning.    . brimonidine-timolol (COMBIGAN) 0.2-0.5 % ophthalmic solution Place 1 drop into both eyes 3 (three) times daily. 1 Bottle 5  . carvedilol (COREG) 12.5 MG tablet Take 1.5 tablets (18.75 mg total) by mouth 2 (two) times daily with a meal. 90 tablet 3  . ferrous sulfate 325 (65 FE) MG tablet Take 1 tablet (325 mg total) by mouth 2 (two) times daily with a meal. 60 tablet 3  . glucose monitoring kit (FREESTYLE) monitoring kit 1 each by Does not apply route 4 (four) times daily - after meals and at bedtime. 1 month Diabetic Testing Supplies for QAC-QHS accuchecks. 1 each 1  . hydrALAZINE (APRESOLINE) 100 MG tablet Take 1 tablet (100 mg total) by mouth every 8 (eight) hours. 90 tablet 0  . isosorbide mononitrate (IMDUR) 60 MG 24 hr tablet Take 1.5 tablets (90 mg total) by mouth daily. 45 tablet 3  . pravastatin (PRAVACHOL) 40 MG tablet Take 1 tablet (40 mg total) by mouth daily. 30 tablet 7  . torsemide (DEMADEX) 20 MG  tablet Take 5 tablets (100 mg total) by mouth daily. 150 tablet 3  . glimepiride (AMARYL) 1 MG tablet Take 1 tablet (1 mg total) by mouth daily before breakfast. 30 tablet 0  . metolazone (ZAROXOLYN) 2.5 MG tablet Take 1 tablet (2.5 mg total) by mouth daily. 30 tablet 3  . traMADol (ULTRAM) 50 MG tablet Take 1 tablet (50 mg total) by mouth every 12 (twelve) hours as needed. 30 tablet 0   No current facility-administered medications for this encounter.   BP 152/98 mmHg  Pulse 83  Wt 124 lb 6.4 oz (56.427 kg) General: NAD Neck: JVP 12 cm, no thyromegaly or thyroid nodule.  Lungs: Clear to auscultation  bilaterally with normal respiratory effort. CV: Nondisplaced PMI.  Heart regular S1/S2, no S3/S4, 2/6 HSM LLSB, widely split S2.  1+ edema to knees.  No carotid bruit.  Unable to palpate pedal pulses.  Abdomen: Soft, nontender, no hepatosplenomegaly, no distention.  Skin: Intact without lesions or rashes.  Neurologic: Alert and oriented x 3.  Psych: Normal affect. Extremities: No clubbing or cyanosis.  HEENT: Normal.   Assessment/Plan: 1. Chronic systolic CHF: Uncertain etiology.  Possible ischemic cardiomyopathy based on prior Cardiolite, but has never had catheterization due to CKD and lack of chest pain/no ischemia on Cardiolite.  EF 10-15% with severe pulmonary hypertension and severe TR on 8/16 echo.  On exam, she is still volume overloaded but considerably improved on new diuretic regimen.  Weight down 6 lbs.  NYHA class II-III symptoms.     - Increase torsemide to 100 mg daily.  - Take metolazone 2.5 mg once daily.   - BMET now. - Wear graded compression stockings during the day.  - Continue Coreg and hydralazine/Imdur at current doses.  - If she goes on HD in the future, would consider cardiac cath to define coronary disease.  2. Leg pain: Peripheral arterial dopplers did not show significant disease.  I cannot explain leg pain from vascular issues, would followup with PCP about this.   3. CKD stage IV: From HTN and DM2.  Followed by Dr Joelyn Oms.  This greatly complicates our ability to diurese her. Follow BMET closely with diuresis, check today.   4. Pre-operative evaluation: She is at at least moderate risk for AV fistula surgery but not prohibitive.  Volume status is much better.  She needs the fistula construction and it is a relatively low risk surgery.  I think she may be as good as she will get from a cardiac standpoint.  I think we can go forward with the surgery.   Loralie Champagne 12/25/2014

## 2014-12-25 NOTE — Patient Instructions (Addendum)
1.  TAKE TYLENOL EXTRA STRENGTH 2 TABLETS EVERY SIX HOURS FOR PAIN. 2. USE TRAMADOL 50MG  ONE TABLET EVERY TWELVE HOURS AS NEEDED FOR MORE SEVERE PAIN. 3.  USE TOPICAL CREAM AT HOME AS NEEDED.   4.  MAKE SURE TO WALK AROUND THE HOUSE EVERY HOUR TO LOOSEN YOUR MUSCLES UP.   5.  RETURN IMMEDIATELY IF YOU DEVELOP FEVER > 100.0, CHILLS, SWEATS.

## 2014-12-25 NOTE — Progress Notes (Addendum)
Subjective:  This chart was scribed for Reginia Forts, MD by Thea Alken, ED Scribe. This patient was seen in room 9 and the patient's care was started at 9:24 AM.   Patient ID: [name], female    DOB: 1950-04-19, 64 y.o.   MRN: [id number]  HPI   Chief Complaint  Patient presents with  . Back Pain  . Leg Pain   HPI Comments: [name] is a 64 y.o. female who presents with daughter to the [facility] complaining of worsening back pain that radiates down legs for 2 weeks. No injury or fall. She reports worsening pain with movement as well as getting up and down from a seated position. She rates pain 10+/10. She has pain and difficulty with walking and needs assistance due to back pain. She takes a total of 3 tylenol a day as well as a topical cream which she's found helpful. She also has difficulty sleeping due to pain. She denies hx of back pain and states this pain is unusual for her.  She has hx of kidney failure and will be starting dialysis soon. She denies numbness and tingling in bilateral legs. She denies bladder incontinence. Per daughter, pt has had bowel incontinence, not making it to the bathroom on time, which has been going before back pain. She denies saddle anesthesia. She denies CP, SOB, abdominal pain and dysuria.  Denies fever/chills/sweats/malaise/fatigue/myalgias.    Past Medical History  Diagnosis Date  . Hypertension   . Diabetes mellitus   . Anemia   . Hyperlipidemia   . Left ventricular hypertrophy   . Cataract     Left eye  . Glaucoma   . Chronic combined systolic and diastolic CHF (congestive heart failure) ([place]) 11/11/2014  . Edema extremities 11/11/2014   Allergies  Allergen Reactions  . Nitrofurantoin Monohyd Macro Nausea Only   Prior to Admission medications   Medication Sig Start Date End Date Taking? Authorizing Provider  aspirin EC 81 MG tablet Take 81 mg by mouth every morning.   Yes Historical Provider, MD    brimonidine-timolol (COMBIGAN) 0.2-0.5 % ophthalmic solution Place 1 drop into both eyes 3 (three) times daily. 09/02/13  Yes Theodis Blaze, MD  carvedilol (COREG) 12.5 MG tablet Take 1.5 tablets (18.75 mg total) by mouth 2 (two) times daily with a meal. 12/23/14  Yes Larey Dresser, MD  ferrous sulfate 325 (65 FE) MG tablet Take 1 tablet (325 mg total) by mouth 2 (two) times daily with a meal. 04/30/14  Yes Lorayne Marek, MD  glimepiride (AMARYL) 1 MG tablet Take 1 tablet (1 mg total) by mouth daily before breakfast. 12/24/14  Yes Josalyn Funches, MD  glucose monitoring kit (FREESTYLE) monitoring kit 1 each by Does not apply route 4 (four) times daily - after meals and at bedtime. 1 month Diabetic Testing Supplies for QAC-QHS accuchecks. 04/30/14  Yes Lorayne Marek, MD  hydrALAZINE (APRESOLINE) 100 MG tablet Take 1 tablet (100 mg total) by mouth every 8 (eight) hours. 10/18/14  Yes Sueanne Margarita, MD  isosorbide mononitrate (IMDUR) 60 MG 24 hr tablet Take 1.5 tablets (90 mg total) by mouth daily. 12/14/14  Yes Larey Dresser, MD  metolazone (ZAROXOLYN) 2.5 MG tablet Take 1 tablet (2.5 mg total) by mouth daily. 12/14/14  Yes Larey Dresser, MD  pravastatin (PRAVACHOL) 40 MG tablet Take 1 tablet (40 mg total) by mouth daily. 02/15/14  Yes Lorayne Marek, MD  torsemide (DEMADEX) 20 MG  tablet Take 5 tablets (100 mg total) by mouth daily. 12/23/14  Yes Larey Dresser, MD   Social History   Social History  . Marital Status: Married    Spouse Name: N/A  . Number of Children: N/A  . Years of Education: N/A   Occupational History  . Not on file.   Social History Main Topics  . Smoking status: Former Research scientist (life sciences)  . Smokeless tobacco: Never Used  . Alcohol Use: No  . Drug Use: No  . Sexual Activity: Not on file   Other Topics Concern  . Not on file   Social History Narrative    Review of Systems  Constitutional: Negative for fever, chills, diaphoresis and fatigue.  Respiratory: Negative for cough  and shortness of breath.   Cardiovascular: Negative for chest pain, palpitations and leg swelling.  Gastrointestinal: Negative for nausea, vomiting, abdominal pain and diarrhea.  Genitourinary: Negative for dysuria, urgency, frequency, hematuria, flank pain and difficulty urinating.  Musculoskeletal: Positive for myalgias, back pain and gait problem.  Skin: Negative for rash.  Neurological: Negative for weakness and numbness.    Objective:   Physical Exam  Constitutional: She is oriented to person, place, and time. She appears well-developed and well-nourished. No distress.  HENT:  Head: Normocephalic and atraumatic.  Eyes: Conjunctivae and EOM are normal.  Neck: Neck supple.  Cardiovascular: Normal rate and regular rhythm.  Exam reveals no gallop and no friction rub.   Murmur heard.  Systolic murmur is present with a grade of 3/6  Trace pitting edema BLE.  Pulmonary/Chest: Effort normal and breath sounds normal. No respiratory distress. She has no wheezes. She has no rales. She exhibits no tenderness.  Abdominal: Soft. Bowel sounds are normal. She exhibits no distension and no mass. There is no tenderness. There is no rebound and no guarding.  Musculoskeletal:       Lumbar back: She exhibits decreased range of motion and pain. She exhibits no tenderness, no bony tenderness, no spasm and normal pulse.  Pain with flexion, extension, lateral bending and rotation. Straight leg raise negative. Motor 5/5 bilateral low extremities. DTR decreased bilaterally. Pain with movement from sitting to standing and from sitting to supine.   Neurological: She is alert and oriented to person, place, and time.  Skin: Skin is warm and dry. She is not diaphoretic.  Psychiatric: She has a normal mood and affect. Her behavior is normal.  Nursing note and vitals reviewed.  Filed Vitals:   12/25/14 0847  BP: 152/86  Pulse: 73  Temp: 98 F (36.7 C)  TempSrc: Oral  Resp: 18  Height: 5' 1"  (1.549 m)   Weight: 127 lb (57.607 kg)  SpO2: 98%    [facility] reading (PRIMARY) by Dr. Tamala Julian. Lumbar spine- degenerative changes L4-L5; otherwise, no acute process.  IMPRESSION: Sclerosis and irregular resort shin is seen involving the inferior endplate of L4 and superior endplate of L5 which may represent severe degenerative disc disease, but is concerning for acute or chronic discitis. MRI is recommended for further evaluation. These results will be called to the ordering clinician or representative by the Radiologist Assistant, and communication documented in the PACS or zVision Dashboard.   Electronically Signed  By: Marijo Conception, M.D.  On: 12/25/2014 10:17      Result History     Assessment & Plan:   1. Bilateral low back pain with right-sided sciatica   2. DDD (degenerative disc disease), lumbar   3. 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 Chronic kidney disease (CKD), stage IV (severe) ([place])   5. Chronic combined systolic and diastolic CHF (congestive heart failure) ([facility])     1.  B lower back pain/DDD lumbar spine: New.  Lumbar films reveal severe DDD versus acute or chronic discitis.  No systemic symptoms to suggest acute discitis.  Normal u/a; stable CBC.  Discussed at length with radiologist; MRI and CT not beneficial because cannot use contrast agent to evaluate for acute infection. May warrant bone scan in future if high concern of acute infection. Treat with Tylenol ES two tablets qid; rx for Tramadol 108m bid PRN renally dosed.  If no improvement in two weeks, call for ortho referral.  Recommend frequent ambulation throughout the day. 2.  DMII: stable; with ESRD. 3.  CKD: stable; to start HD in upcoming weeks. 4.  Chronic systolic and diastolic CHF: stable; mild volume overload today.   Orders Placed This Encounter  Procedures  . Urine culture  . DG Lumbar Spine Complete     Standing Status: Future     Number of Occurrences: 1     Standing Expiration Date: 12/25/2015    Order Specific Question:  Reason for Exam (SYMPTOM  OR DIAGNOSIS REQUIRED)    Answer:  lower back pain with movement; radiation into legs; known CHF and ESRD    Order Specific Question:  Preferred imaging location?    Answer:  External  . POCT CBC  . POCT SEDIMENTATION RATE  . POCT urinalysis dipstick  . POCT Microscopic Urinalysis ([facility])    Meds ordered this encounter  Medications  . DISCONTD: traMADol (ULTRAM) 50 MG tablet    Sig: Take 1 tablet (50 mg total) by mouth every 8 (eight) hours as needed.    Dispense:  30 tablet    Refill:  0  . traMADol (ULTRAM) 50 MG tablet    Sig: Take 1 tablet (50 mg total) by mouth every 12 (twelve) hours as needed.    Dispense:  30 tablet    Refill:  0    I personally performed the services described in this documentation, which was scribed in my presence. The recorded information has been reviewed and considered.  Lesleyann Fichter MElayne Guerin M.D. [facility]  [phone]

## 2014-12-25 NOTE — Telephone Encounter (Signed)
Phone in Tramadol

## 2014-12-27 ENCOUNTER — Telehealth (HOSPITAL_COMMUNITY): Payer: Self-pay | Admitting: *Deleted

## 2014-12-27 DIAGNOSIS — I5042 Chronic combined systolic (congestive) and diastolic (congestive) heart failure: Secondary | ICD-10-CM

## 2014-12-27 LAB — URINE CULTURE

## 2014-12-27 NOTE — Telephone Encounter (Signed)
Per Dr Shirlee Latch, echo ordered, [facility] please schedule this week

## 2014-12-27 NOTE — Telephone Encounter (Signed)
-----   Message from Laurey Morale, MD sent at 12/25/2014  7:18 PM EDT ----- Regarding: RE: upcoming AVF surgery Unlikely to have changed much but if they really want it we can do one.   Herbert Seta, can you arrange an echo for [name] per the anesthesiologist's request? Thanks   ----- Message -----    From: Jerold Coombe, PA-C    Sent: 12/24/2014   4:17 PM      To: Laurey Morale, MD Subject: upcoming AVF surgery                           Dr. Shirlee Latch, [name] is back on the OR schedule for an AVF on 01/15/15. I see that you cleared her. Anesthesiologist Dr. Okey Dupre reviewed her last studies. With an EF to 10-15% on her last echo 11/17/14, he said he would feel better having an echo repeated prior to her surgery. I know it won't be a full three months, but is that something you can arrange? (Or let me know if you have some other input that would make the anesthesiologist feel better about proceeding).  Thanks for your consideration.  Velna Ochs [facility] Short Stay Center/Anesthesiology Phone [phone] or [phone] 12/24/2014 4:21 PM

## 2014-12-28 ENCOUNTER — Ambulatory Visit (HOSPITAL_COMMUNITY)
Admission: RE | Admit: 2014-12-28 | Discharge: 2014-12-28 | Disposition: A | Discharge status: Home / Self Care | Payer: Commercial Managed Care - HMO | Source: Ambulatory Visit | Attending: Vascular Surgery | Admitting: Vascular Surgery

## 2014-12-28 DIAGNOSIS — J90 Pleural effusion, not elsewhere classified: Secondary | ICD-10-CM | POA: Diagnosis not present

## 2014-12-28 DIAGNOSIS — I517 Cardiomegaly: Secondary | ICD-10-CM | POA: Insufficient documentation

## 2014-12-28 DIAGNOSIS — I340 Nonrheumatic mitral (valve) insufficiency: Secondary | ICD-10-CM | POA: Diagnosis not present

## 2014-12-28 DIAGNOSIS — E119 Type 2 diabetes mellitus without complications: Secondary | ICD-10-CM | POA: Diagnosis not present

## 2014-12-28 DIAGNOSIS — I313 Pericardial effusion (noninflammatory): Secondary | ICD-10-CM | POA: Insufficient documentation

## 2014-12-28 DIAGNOSIS — I5042 Chronic combined systolic (congestive) and diastolic (congestive) heart failure: Secondary | ICD-10-CM

## 2014-12-28 DIAGNOSIS — I10 Essential (primary) hypertension: Secondary | ICD-10-CM | POA: Insufficient documentation

## 2014-12-28 DIAGNOSIS — I071 Rheumatic tricuspid insufficiency: Secondary | ICD-10-CM | POA: Insufficient documentation

## 2014-12-28 DIAGNOSIS — I351 Nonrheumatic aortic (valve) insufficiency: Secondary | ICD-10-CM | POA: Diagnosis not present

## 2014-12-28 DIAGNOSIS — I509 Heart failure, unspecified: Secondary | ICD-10-CM | POA: Diagnosis present

## 2014-12-28 NOTE — Progress Notes (Signed)
  Echocardiogram 2D Echocardiogram has been performed.  [name] M 12/28/2014, 2:55 PM

## 2014-12-28 NOTE — Progress Notes (Addendum)
Anesthesia Chart Review: SAME DAY WORK-UP.   Patient is a 64 year old female scheduled for left radiocephalic AVF on 14-Jan-2015 by Dr. Darrick Penna. Case is posted for MAC anesthesia. Case was initially scheduled for 12/07/14 but was postponed to allow for her to be evaluated at [facility] due to a recent decrease in her EF from 45-50% to 10-15%. Since then patient was seen by Dr. Shirlee Latch who felt: "She is at at least moderate risk for AV fistula surgery but not prohibitive. Volume status is much better. She needs the fistula construction and it is a relatively low risk surgery. I think she may be as good as she will get from a cardiac standpoint. I think we can go forward with the surgery." Anesthesiologist Dr. Okey Dupre had requested that her echo be repeated prior to surgery which was done and showed her EF up to 20-25% from 6 weeks ago.   History includes former smoker, CKD stage V not yet on HD, chronic combined systolic and diastolic CHF, LVH, LE edema, HLD, anemia, HTN, DM2, glaucoma, left cataract, cholecystectomy, 10/2013 Myoview results suggestive of prior infarct (never had catheterization due to CKD and lack of chest pain/no ischemia on Cardiolite).   Nephrologist is Dr. Marisue Humble (scanned note 10/18/14 indicated there was no indication to start hemodialysis at that time). Cardiologist is Dr. Mayford Knife, last visit 11/12/14 (as urgent visit for new SOB wthin 24 hours) with echo ordered showing a drop in her EF from 45-50% to 10-15% since last year and evidence of severe pulmonary hypertension. She referred patient to the [facility], Dr. Marca Ancona, last visit 12/23/14.   Meds includes ASA 81 mg, Combigan ophthalmic, Coreg, 65 Fe, metolazone, torsemide, Amaryl, hydralazine, Imdur, pravastatin, tramadol.  12/28/14 Echo: Study Conclusions - Left ventricle: The cavity size was severely dilated. Wall thickness was normal. Systolic function was severely reduced. The estimated ejection  fraction was in the range of 20% to 25%. Diffuse hypokinesis. Doppler parameters are consistent with both elevated ventricular end-diastolic filling pressure and elevated left atrial filling pressure. - Aortic valve: There was mild regurgitation. - Mitral valve: There was moderate regurgitation. - Left atrium: The atrium was moderately dilated. - Right atrium: The atrium was mildly dilated. - Atrial septum: No defect or patent foramen ovale was identified. - Tricuspid valve: There was severe regurgitation. - Pulmonary arteries: PA peak pressure: 87 mm Hg (S). - Pericardium, extracardiac: A small pericardial effusion was identified posterior to the heart. There was a left pleural effusion. (Comparison 11/17/14 Echo: EF 10-15%, grade 2 diastolic dysfunction, mild AR, moderate MR, severe TR, PA peak pressure 97 mmHg; 08/17/13 Echo: EF 45-50%, grade 1 diastolic dysfunction, trivial MR.)  10/24/13 Nuclear stress test: IMPRESSION: No evidence for pharmacological induced ischemia. Fixed defect involving the inferolateral wall with associated wall motion abnormalities. Findings are suggestive for an area of infarct. Calculated ejection fraction is 40%. (By notes, cardiologist Dr. Rennis Golden recommended medical therapy due to lack of chest pain and advanced CRF at that time.)  10/15/14 EKG: SR, right BBB, LAD consider LAFB, prolonged QT interval, baseline wanderer in V6. Lateral T wave abnormality, most notable in high lateral leads, consider ischemia or repolarization abnormality.  10/15/14 CXR: IMPRESSION: Probable congestive heart failure with interstitial and alveolar edema. Very small effusions.  Labs will be done pre-operatively.  I'll plan to update Dr. Okey Dupre to ensure nothing further is needed prior to her surgery. (Update 12/28/13: Dr. Okey Dupre updated. No new recommendations. If no acute CV/CHF  symptoms and labs are acceptable then would plan to proceed.)  [name] [facility] Short Stay  Center/Anesthesiology Phone [phone] 12/28/2014 5:45 PM

## 2014-12-29 MED ORDER — AMOXICILLIN 500 MG PO TABS: 500.0000 mg | ORAL_TABLET | Freq: Two times a day (BID) | ORAL | Status: AC

## 2014-12-29 NOTE — Addendum Note (Signed)
Addended by: Ethelda Chick on: 12/29/2014 10:55 AM   Modules accepted: Orders

## 2014-12-31 ENCOUNTER — Encounter (HOSPITAL_COMMUNITY): Payer: Self-pay | Admitting: *Deleted

## 2014-12-31 NOTE — Progress Notes (Addendum)
Pt denies chest pain or sob. Cardiac clearance noted in EPIC from Dr. Shirlee Latch. Pt states that the "fluid is coming off nicely" from her legs.

## 2015-01-02 MED ORDER — SODIUM CHLORIDE 0.9 % IV SOLN: INTRAVENOUS | Status: DC

## 2015-01-02 MED ORDER — DEXTROSE 5 % IV SOLN
1.5000 g | INTRAVENOUS | Status: AC
  Administered 2015-01-03: 1.5 g via INTRAVENOUS
  Filled 2015-01-02: qty 1.5

## 2015-01-03 ENCOUNTER — Encounter (HOSPITAL_COMMUNITY): Payer: Self-pay | Admitting: Anesthesiology

## 2015-01-03 ENCOUNTER — Inpatient Hospital Stay (HOSPITAL_COMMUNITY): Payer: Commercial Managed Care - HMO

## 2015-01-03 ENCOUNTER — Encounter (HOSPITAL_COMMUNITY): Admission: RE | Disposition: E | Payer: Self-pay | Source: Ambulatory Visit | Attending: Internal Medicine

## 2015-01-03 ENCOUNTER — Ambulatory Visit (HOSPITAL_COMMUNITY): Payer: Commercial Managed Care - HMO | Admitting: Vascular Surgery

## 2015-01-03 ENCOUNTER — Inpatient Hospital Stay (HOSPITAL_COMMUNITY)
Admission: RE | Admit: 2015-01-03 | Discharge: 2015-01-18 | DRG: 252 | Disposition: E | Payer: Commercial Managed Care - HMO | Source: Ambulatory Visit | Attending: Internal Medicine | Admitting: Internal Medicine

## 2015-01-03 DIAGNOSIS — E872 Acidosis: Secondary | ICD-10-CM | POA: Diagnosis present

## 2015-01-03 DIAGNOSIS — Z7982 Long term (current) use of aspirin: Secondary | ICD-10-CM

## 2015-01-03 DIAGNOSIS — R042 Hemoptysis: Secondary | ICD-10-CM | POA: Diagnosis present

## 2015-01-03 DIAGNOSIS — I5043 Acute on chronic combined systolic (congestive) and diastolic (congestive) heart failure: Secondary | ICD-10-CM | POA: Diagnosis present

## 2015-01-03 DIAGNOSIS — J9601 Acute respiratory failure with hypoxia: Secondary | ICD-10-CM | POA: Diagnosis not present

## 2015-01-03 DIAGNOSIS — G931 Anoxic brain damage, not elsewhere classified: Secondary | ICD-10-CM | POA: Diagnosis present

## 2015-01-03 DIAGNOSIS — N390 Urinary tract infection, site not specified: Secondary | ICD-10-CM | POA: Diagnosis present

## 2015-01-03 DIAGNOSIS — I469 Cardiac arrest, cause unspecified: Secondary | ICD-10-CM | POA: Diagnosis not present

## 2015-01-03 DIAGNOSIS — I313 Pericardial effusion (noninflammatory): Secondary | ICD-10-CM | POA: Diagnosis present

## 2015-01-03 DIAGNOSIS — I132 Hypertensive heart and chronic kidney disease with heart failure and with stage 5 chronic kidney disease, or end stage renal disease: Secondary | ICD-10-CM | POA: Diagnosis present

## 2015-01-03 DIAGNOSIS — E871 Hypo-osmolality and hyponatremia: Secondary | ICD-10-CM | POA: Diagnosis present

## 2015-01-03 DIAGNOSIS — I272 Other secondary pulmonary hypertension: Secondary | ICD-10-CM | POA: Diagnosis present

## 2015-01-03 DIAGNOSIS — Z833 Family history of diabetes mellitus: Secondary | ICD-10-CM

## 2015-01-03 DIAGNOSIS — N186 End stage renal disease: Secondary | ICD-10-CM | POA: Diagnosis present

## 2015-01-03 DIAGNOSIS — Z79899 Other long term (current) drug therapy: Secondary | ICD-10-CM | POA: Diagnosis not present

## 2015-01-03 DIAGNOSIS — Z87891 Personal history of nicotine dependence: Secondary | ICD-10-CM

## 2015-01-03 DIAGNOSIS — D638 Anemia in other chronic diseases classified elsewhere: Secondary | ICD-10-CM | POA: Diagnosis present

## 2015-01-03 DIAGNOSIS — I371 Nonrheumatic pulmonary valve insufficiency: Secondary | ICD-10-CM | POA: Diagnosis present

## 2015-01-03 DIAGNOSIS — R001 Bradycardia, unspecified: Secondary | ICD-10-CM | POA: Diagnosis present

## 2015-01-03 DIAGNOSIS — R197 Diarrhea, unspecified: Secondary | ICD-10-CM | POA: Diagnosis present

## 2015-01-03 DIAGNOSIS — E861 Hypovolemia: Secondary | ICD-10-CM | POA: Diagnosis present

## 2015-01-03 DIAGNOSIS — E785 Hyperlipidemia, unspecified: Secondary | ICD-10-CM | POA: Diagnosis present

## 2015-01-03 DIAGNOSIS — E1122 Type 2 diabetes mellitus with diabetic chronic kidney disease: Secondary | ICD-10-CM | POA: Diagnosis present

## 2015-01-03 DIAGNOSIS — I351 Nonrheumatic aortic (valve) insufficiency: Secondary | ICD-10-CM | POA: Diagnosis present

## 2015-01-03 DIAGNOSIS — H409 Unspecified glaucoma: Secondary | ICD-10-CM | POA: Diagnosis present

## 2015-01-03 DIAGNOSIS — Z7984 Long term (current) use of oral hypoglycemic drugs: Secondary | ICD-10-CM

## 2015-01-03 DIAGNOSIS — G9340 Encephalopathy, unspecified: Secondary | ICD-10-CM | POA: Insufficient documentation

## 2015-01-03 DIAGNOSIS — I340 Nonrheumatic mitral (valve) insufficiency: Secondary | ICD-10-CM | POA: Diagnosis present

## 2015-01-03 DIAGNOSIS — Z8249 Family history of ischemic heart disease and other diseases of the circulatory system: Secondary | ICD-10-CM

## 2015-01-03 DIAGNOSIS — Z87440 Personal history of urinary (tract) infections: Secondary | ICD-10-CM

## 2015-01-03 DIAGNOSIS — M25551 Pain in right hip: Secondary | ICD-10-CM

## 2015-01-03 DIAGNOSIS — E876 Hypokalemia: Secondary | ICD-10-CM | POA: Diagnosis present

## 2015-01-03 DIAGNOSIS — R579 Shock, unspecified: Secondary | ICD-10-CM | POA: Diagnosis not present

## 2015-01-03 DIAGNOSIS — M1990 Unspecified osteoarthritis, unspecified site: Secondary | ICD-10-CM | POA: Diagnosis present

## 2015-01-03 DIAGNOSIS — N185 Chronic kidney disease, stage 5: Secondary | ICD-10-CM

## 2015-01-03 DIAGNOSIS — Z66 Do not resuscitate: Secondary | ICD-10-CM | POA: Diagnosis present

## 2015-01-03 HISTORY — PX: AV FISTULA PLACEMENT: SHX1204

## 2015-01-03 HISTORY — DX: Chronic kidney disease, unspecified: N18.9

## 2015-01-03 HISTORY — DX: Unspecified osteoarthritis, unspecified site: M19.90

## 2015-01-03 LAB — URINE MICROSCOPIC-ADD ON

## 2015-01-03 LAB — POCT I-STAT 3, ART BLOOD GAS (G3+)
ACID-BASE DEFICIT: 7 mmol/L — AB (ref 0.0–2.0)
ACID-BASE DEFICIT: 2 mmol/L (ref 0.0–2.0)
Acid-base deficit: 2 mmol/L (ref 0.0–2.0)
Acid-base deficit: 3 mmol/L — ABNORMAL HIGH (ref 0.0–2.0)
BICARBONATE: 21.7 meq/L (ref 20.0–24.0)
BICARBONATE: 19.6 meq/L — AB (ref 20.0–24.0)
Bicarbonate: 19.8 mEq/L — ABNORMAL LOW (ref 20.0–24.0)
Bicarbonate: 22.2 mEq/L (ref 20.0–24.0)
O2 SAT: 84 %
O2 SAT: 99 %
O2 SAT: 100 %
O2 Saturation: 97 %
PCO2 ART: 32.4 mmHg — AB (ref 35.0–45.0)
PCO2 ART: 30.5 mmHg — AB (ref 35.0–45.0)
PCO2 ART: 28.1 mmHg — AB (ref 35.0–45.0)
PH ART: 7.298 — AB (ref 7.350–7.450)
PH ART: 7.435 (ref 7.350–7.450)
PO2 ART: 51 mmHg — AB (ref 80.0–100.0)
PO2 ART: 214 mmHg — AB (ref 80.0–100.0)
Patient temperature: 97
Patient temperature: 95.1
Patient temperature: 97
Patient temperature: 99.1
TCO2: 21 mmol/L (ref 0–100)
TCO2: 23 mmol/L (ref 0–100)
TCO2: 23 mmol/L (ref 0–100)
TCO2: 20 mmol/L (ref 0–100)
pCO2 arterial: 40 mmHg (ref 35.0–45.0)
pH, Arterial: 7.457 — ABNORMAL HIGH (ref 7.350–7.450)
pH, Arterial: 7.454 — ABNORMAL HIGH (ref 7.350–7.450)
pO2, Arterial: 133 mmHg — ABNORMAL HIGH (ref 80.0–100.0)
pO2, Arterial: 82 mmHg (ref 80.0–100.0)

## 2015-01-03 LAB — CBC
HCT: 28 % — ABNORMAL LOW (ref 36.0–46.0)
Hemoglobin: 9.2 g/dL — ABNORMAL LOW (ref 12.0–15.0)
MCH: 26.2 pg (ref 26.0–34.0)
MCHC: 32.9 g/dL (ref 30.0–36.0)
MCV: 79.8 fL (ref 78.0–100.0)
PLATELETS: 161 10*3/uL (ref 150–400)
RBC: 3.51 MIL/uL — ABNORMAL LOW (ref 3.87–5.11)
RDW: 14.8 % (ref 11.5–15.5)
WBC: 6.5 10*3/uL (ref 4.0–10.5)

## 2015-01-03 LAB — COMPREHENSIVE METABOLIC PANEL
ALK PHOS: 146 U/L — AB (ref 38–126)
ALT: 146 U/L — AB (ref 14–54)
AST: 257 U/L — AB (ref 15–41)
Albumin: 2.6 g/dL — ABNORMAL LOW (ref 3.5–5.0)
Anion gap: 11 (ref 5–15)
BILIRUBIN TOTAL: 0.9 mg/dL (ref 0.3–1.2)
BUN: 83 mg/dL — AB (ref 6–20)
CHLORIDE: 102 mmol/L (ref 101–111)
CO2: 21 mmol/L — ABNORMAL LOW (ref 22–32)
Calcium: 8.7 mg/dL — ABNORMAL LOW (ref 8.9–10.3)
Creatinine, Ser: 3.78 mg/dL — ABNORMAL HIGH (ref 0.44–1.00)
GFR, EST AFRICAN AMERICAN: 13 mL/min — AB (ref >60)
GFR, EST NON AFRICAN AMERICAN: 11 mL/min — AB (ref >60)
Glucose, Bld: 178 mg/dL — ABNORMAL HIGH (ref 65–99)
Potassium: 2.8 mmol/L — ABNORMAL LOW (ref 3.5–5.1)
Sodium: 134 mmol/L — ABNORMAL LOW (ref 135–145)
Total Protein: 4.9 g/dL — ABNORMAL LOW (ref 6.5–8.1)

## 2015-01-03 LAB — FIBRINOGEN: Fibrinogen: 239 mg/dL (ref 204–475)

## 2015-01-03 LAB — URINALYSIS, ROUTINE W REFLEX MICROSCOPIC
Bilirubin Urine: NEGATIVE
GLUCOSE, UA: 100 mg/dL — AB
Ketones, ur: NEGATIVE mg/dL
LEUKOCYTES UA: NEGATIVE
Nitrite: NEGATIVE
PH: 5.5 (ref 5.0–8.0)
Protein, ur: 100 mg/dL — AB
Specific Gravity, Urine: 1.01 (ref 1.005–1.030)
Urobilinogen, UA: 0.2 mg/dL (ref 0.0–1.0)

## 2015-01-03 LAB — GLUCOSE, CAPILLARY
GLUCOSE-CAPILLARY: 162 mg/dL — AB (ref 65–99)
Glucose-Capillary: 118 mg/dL — ABNORMAL HIGH (ref 65–99)
Glucose-Capillary: 161 mg/dL — ABNORMAL HIGH (ref 65–99)

## 2015-01-03 LAB — LACTIC ACID, PLASMA: LACTIC ACID, VENOUS: 2.1 mmol/L — AB (ref 0.5–2.0)

## 2015-01-03 LAB — POCT I-STAT 4, (NA,K, GLUC, HGB,HCT)
Glucose, Bld: 116 mg/dL — ABNORMAL HIGH (ref 65–99)
HCT: 36 % (ref 36.0–46.0)
HEMOGLOBIN: 12.2 g/dL (ref 12.0–15.0)
Potassium: 3.2 mmol/L — ABNORMAL LOW (ref 3.5–5.1)
SODIUM: 132 mmol/L — AB (ref 135–145)

## 2015-01-03 LAB — LACTATE DEHYDROGENASE: LDH: 605 U/L — AB (ref 98–192)

## 2015-01-03 LAB — PROTIME-INR
INR: 1.47 (ref 0.00–1.49)
INR: 1.4 (ref 0.00–1.49)
PROTHROMBIN TIME: 17.2 s — AB (ref 11.6–15.2)
Prothrombin Time: 17.9 seconds — ABNORMAL HIGH (ref 11.6–15.2)

## 2015-01-03 LAB — MRSA PCR SCREENING: MRSA by PCR: NEGATIVE

## 2015-01-03 LAB — HEMOGLOBIN AND HEMATOCRIT, BLOOD
HEMATOCRIT: 29.6 % — AB (ref 36.0–46.0)
HEMOGLOBIN: 9.7 g/dL — AB (ref 12.0–15.0)

## 2015-01-03 LAB — PROCALCITONIN: Procalcitonin: 0.42 ng/mL

## 2015-01-03 LAB — PHOSPHORUS: PHOSPHORUS: 6 mg/dL — AB (ref 2.5–4.6)

## 2015-01-03 LAB — MAGNESIUM: Magnesium: 2.2 mg/dL (ref 1.7–2.4)

## 2015-01-03 LAB — TROPONIN I
TROPONIN I: 0.21 ng/mL — AB (ref <0.031)
Troponin I: 0.84 ng/mL (ref <0.031)

## 2015-01-03 LAB — CORTISOL: CORTISOL PLASMA: 30 ug/dL

## 2015-01-03 SURGERY — ARTERIOVENOUS (AV) FISTULA CREATION
Anesthesia: General | Site: Arm Lower | Laterality: Left

## 2015-01-03 MED ORDER — CETYLPYRIDINIUM CHLORIDE 0.05 % MT LIQD: 7.0000 mL | Freq: Two times a day (BID) | OROMUCOSAL | Status: DC

## 2015-01-03 MED ORDER — 0.9 % SODIUM CHLORIDE (POUR BTL) OPTIME
TOPICAL | Status: DC | PRN
  Administered 2015-01-03: 1000 mL

## 2015-01-03 MED ORDER — LIDOCAINE HCL (PF) 1 % IJ SOLN
INTRAMUSCULAR | Status: AC
  Filled 2015-01-03: qty 30

## 2015-01-03 MED ORDER — SODIUM CHLORIDE 0.9 % IV SOLN
INTRAVENOUS | Status: DC | PRN
  Administered 2015-01-03: 500 mL

## 2015-01-03 MED ORDER — PHENYLEPHRINE HCL 10 MG/ML IJ SOLN
INTRAMUSCULAR | Status: DC | PRN
  Administered 2015-01-03: 160 ug via INTRAVENOUS
  Administered 2015-01-03 (×3): 80 ug via INTRAVENOUS

## 2015-01-03 MED ORDER — PHENYLEPHRINE HCL 10 MG/ML IJ SOLN
0.0000 ug/min | INTRAMUSCULAR | Status: DC
  Filled 2015-01-03: qty 1

## 2015-01-03 MED ORDER — PROPOFOL 10 MG/ML IV BOLUS
INTRAVENOUS | Status: AC
  Filled 2015-01-03: qty 20

## 2015-01-03 MED ORDER — GLYCOPYRROLATE 0.2 MG/ML IJ SOLN
INTRAMUSCULAR | Status: DC | PRN
  Administered 2015-01-03 (×2): 0.4 mg via INTRAVENOUS

## 2015-01-03 MED ORDER — MIDAZOLAM HCL 2 MG/2ML IJ SOLN
2.0000 mg | INTRAMUSCULAR | Status: DC | PRN
  Administered 2015-01-03 (×2): 2 mg via INTRAVENOUS
  Filled 2015-01-03 (×2): qty 2

## 2015-01-03 MED ORDER — FENTANYL CITRATE (PF) 250 MCG/5ML IJ SOLN
INTRAMUSCULAR | Status: AC
  Filled 2015-01-03: qty 5

## 2015-01-03 MED ORDER — TIMOLOL MALEATE 0.5 % OP SOLN
1.0000 [drp] | Freq: Three times a day (TID) | OPHTHALMIC | Status: DC
  Administered 2015-01-03 (×2): 1 [drp] via OPHTHALMIC
  Filled 2015-01-03: qty 5

## 2015-01-03 MED ORDER — SODIUM CHLORIDE 0.9 % IV SOLN
10.0000 mg | INTRAVENOUS | Status: DC | PRN
Start: 2015-01-03 — End: 2015-01-03
  Administered 2015-01-03: 25 ug/min via INTRAVENOUS

## 2015-01-03 MED ORDER — SODIUM CHLORIDE 0.9 % IV SOLN
INTRAVENOUS | Status: DC
  Administered 2015-01-03: 11:00:00 via INTRAVENOUS

## 2015-01-03 MED ORDER — FENTANYL BOLUS VIA INFUSION
25.0000 ug | INTRAVENOUS | Status: DC | PRN
  Filled 2015-01-03: qty 25

## 2015-01-03 MED ORDER — SODIUM BICARBONATE 8.4 % IV SOLN
INTRAVENOUS | Status: DC | PRN
  Administered 2015-01-03 (×2): 25 meq via INTRAVENOUS

## 2015-01-03 MED ORDER — HEPARIN SODIUM (PORCINE) 5000 UNIT/ML IJ SOLN: 5000.0000 [IU] | Freq: Three times a day (TID) | INTRAMUSCULAR | Status: DC

## 2015-01-03 MED ORDER — NOREPINEPHRINE BITARTRATE 1 MG/ML IV SOLN
0.0000 ug/min | INTRAVENOUS | Status: DC
  Administered 2015-01-03: 15 ug/min via INTRAVENOUS
  Filled 2015-01-03: qty 16

## 2015-01-03 MED ORDER — FAMOTIDINE IN NACL 20-0.9 MG/50ML-% IV SOLN
20.0000 mg | INTRAVENOUS | Status: DC
  Administered 2015-01-03: 20 mg via INTRAVENOUS
  Filled 2015-01-03: qty 50

## 2015-01-03 MED ORDER — PROPOFOL 10 MG/ML IV BOLUS
INTRAVENOUS | Status: DC | PRN
  Administered 2015-01-03: 130 mg via INTRAVENOUS

## 2015-01-03 MED ORDER — ONDANSETRON HCL 4 MG/2ML IJ SOLN
4.0000 mg | Freq: Four times a day (QID) | INTRAMUSCULAR | Status: DC | PRN
Start: 2015-01-03 — End: 2015-01-04

## 2015-01-03 MED ORDER — SODIUM CHLORIDE 0.9 % IV SOLN
INTRAVENOUS | Status: DC | PRN
  Administered 2015-01-03 (×2): via INTRAVENOUS

## 2015-01-03 MED ORDER — CEFTRIAXONE SODIUM 1 G IJ SOLR
1.0000 g | INTRAMUSCULAR | Status: DC
  Administered 2015-01-03: 1 g via INTRAVENOUS
  Filled 2015-01-03: qty 10

## 2015-01-03 MED ORDER — THROMBIN 20000 UNITS EX SOLR
CUTANEOUS | Status: AC
  Filled 2015-01-03: qty 20000

## 2015-01-03 MED ORDER — CALCIUM CHLORIDE 10 % IV SOLN
INTRAVENOUS | Status: DC | PRN
  Administered 2015-01-03: 100 mg via INTRAVENOUS
  Administered 2015-01-03 (×3): 200 mg via INTRAVENOUS
  Administered 2015-01-03: 300 mg via INTRAVENOUS

## 2015-01-03 MED ORDER — ASPIRIN EC 81 MG PO TBEC: 81.0000 mg | DELAYED_RELEASE_TABLET | Freq: Every morning | ORAL | Status: DC

## 2015-01-03 MED ORDER — ATROPINE SULFATE 0.4 MG/ML IJ SOLN
INTRAMUSCULAR | Status: DC | PRN
  Administered 2015-01-03: .5 mg via INTRAVENOUS

## 2015-01-03 MED ORDER — PANTOPRAZOLE SODIUM 40 MG IV SOLR: 40.0000 mg | Freq: Every day | INTRAVENOUS | Status: DC

## 2015-01-03 MED ORDER — SODIUM CHLORIDE 0.9 % IV SOLN
25.0000 ug/h | INTRAVENOUS | Status: DC
  Administered 2015-01-03: 50 ug/h via INTRAVENOUS
  Filled 2015-01-03: qty 50

## 2015-01-03 MED ORDER — EPINEPHRINE HCL 0.1 MG/ML IJ SOSY
PREFILLED_SYRINGE | INTRAMUSCULAR | Status: DC | PRN
  Administered 2015-01-03: 200 ug via INTRAVENOUS
  Administered 2015-01-03: 400 ug via INTRAVENOUS
  Administered 2015-01-03 (×2): 200 ug via INTRAVENOUS

## 2015-01-03 MED ORDER — POTASSIUM CHLORIDE 10 MEQ/50ML IV SOLN
10.0000 meq | INTRAVENOUS | Status: AC
  Administered 2015-01-03 (×2): 10 meq via INTRAVENOUS
  Filled 2015-01-03 (×3): qty 50

## 2015-01-03 MED ORDER — BRIMONIDINE TARTRATE-TIMOLOL 0.2-0.5 % OP SOLN: 1.0000 [drp] | Freq: Three times a day (TID) | OPHTHALMIC | Status: DC

## 2015-01-03 MED ORDER — CHLORHEXIDINE GLUCONATE 0.12 % MT SOLN
15.0000 mL | Freq: Two times a day (BID) | OROMUCOSAL | Status: DC
  Administered 2015-01-03: 15 mL via OROMUCOSAL

## 2015-01-03 MED ORDER — LIDOCAINE HCL (CARDIAC) 20 MG/ML IV SOLN
INTRAVENOUS | Status: DC | PRN
  Administered 2015-01-03: 100 mg via INTRAVENOUS

## 2015-01-03 MED ORDER — BRIMONIDINE TARTRATE 0.2 % OP SOLN
1.0000 [drp] | Freq: Three times a day (TID) | OPHTHALMIC | Status: DC
  Administered 2015-01-03 (×2): 1 [drp] via OPHTHALMIC
  Filled 2015-01-03: qty 5

## 2015-01-03 MED ORDER — FENTANYL CITRATE (PF) 100 MCG/2ML IJ SOLN: 50.0000 ug | Freq: Once | INTRAMUSCULAR | Status: DC

## 2015-01-03 MED ORDER — INSULIN ASPART 100 UNIT/ML ~~LOC~~ SOLN
0.0000 [IU] | SUBCUTANEOUS | Status: DC
  Administered 2015-01-03 (×2): 2 [IU] via SUBCUTANEOUS

## 2015-01-03 MED ORDER — FENTANYL CITRATE (PF) 100 MCG/2ML IJ SOLN
INTRAMUSCULAR | Status: DC | PRN
  Administered 2015-01-03: 50 ug via INTRAVENOUS

## 2015-01-03 MED ORDER — CHLORHEXIDINE GLUCONATE CLOTH 2 % EX PADS: 6.0000 | MEDICATED_PAD | Freq: Once | CUTANEOUS | Status: DC

## 2015-01-03 MED ORDER — DEXTROSE 5 % IV SOLN
0.5000 ug/min | INTRAVENOUS | Status: AC
  Administered 2015-01-03: 5 ug/min via INTRAVENOUS
  Filled 2015-01-03: qty 4

## 2015-01-03 MED ORDER — ALBUMIN HUMAN 5 % IV SOLN
INTRAVENOUS | Status: DC | PRN
  Administered 2015-01-03 (×4): via INTRAVENOUS

## 2015-01-03 MED ORDER — EPHEDRINE SULFATE 50 MG/ML IJ SOLN
INTRAMUSCULAR | Status: DC | PRN
  Administered 2015-01-03 (×4): 50 mg via INTRAVENOUS

## 2015-01-03 SURGICAL SUPPLY — 34 items
ARMBAND PINK RESTRICT EXTREMIT (MISCELLANEOUS) ×2 IMPLANT
CANISTER SUCTION 2500CC (MISCELLANEOUS) ×2 IMPLANT
CANNULA VESSEL 3MM 2 BLNT TIP (CANNULA) ×2 IMPLANT
CLIP TI MEDIUM 6 (CLIP) ×2 IMPLANT
CLIP TI WIDE RED SMALL 6 (CLIP) ×2 IMPLANT
COVER PROBE W GEL 5X96 (DRAPES) IMPLANT
DECANTER SPIKE VIAL GLASS SM (MISCELLANEOUS) ×2 IMPLANT
DRAIN PENROSE 1/4X12 LTX STRL (WOUND CARE) ×2 IMPLANT
DRSG COVADERM 4X6 (GAUZE/BANDAGES/DRESSINGS) ×1 IMPLANT
ELECT REM PT RETURN 9FT ADLT (ELECTROSURGICAL) ×2
ELECTRODE REM PT RTRN 9FT ADLT (ELECTROSURGICAL) ×1 IMPLANT
GLOVE BIO SURGEON STRL SZ 6.5 (GLOVE) ×2 IMPLANT
GLOVE BIO SURGEON STRL SZ7.5 (GLOVE) ×2 IMPLANT
GLOVE BIOGEL PI IND STRL 6.5 (GLOVE) IMPLANT
GLOVE BIOGEL PI IND STRL 7.0 (GLOVE) IMPLANT
GLOVE BIOGEL PI INDICATOR 6.5 (GLOVE) ×1
GLOVE BIOGEL PI INDICATOR 7.0 (GLOVE) ×1
GOWN STRL REUS W/ TWL LRG LVL3 (GOWN DISPOSABLE) ×3 IMPLANT
GOWN STRL REUS W/TWL LRG LVL3 (GOWN DISPOSABLE) ×6
KIT BASIN OR (CUSTOM PROCEDURE TRAY) ×2 IMPLANT
KIT ROOM TURNOVER OR (KITS) ×2 IMPLANT
LIQUID BAND (GAUZE/BANDAGES/DRESSINGS) ×2 IMPLANT
LOOP VESSEL MINI RED (MISCELLANEOUS) IMPLANT
NS IRRIG 1000ML POUR BTL (IV SOLUTION) ×2 IMPLANT
PACK CV ACCESS (CUSTOM PROCEDURE TRAY) ×2 IMPLANT
PAD ARMBOARD 7.5X6 YLW CONV (MISCELLANEOUS) ×4 IMPLANT
SPONGE SURGIFOAM ABS GEL 100 (HEMOSTASIS) IMPLANT
STAPLER VISISTAT 35W (STAPLE) ×1 IMPLANT
SUT PROLENE 7 0 BV 1 (SUTURE) ×2 IMPLANT
SUT VIC AB 3-0 SH 27 (SUTURE) ×2
SUT VIC AB 3-0 SH 27X BRD (SUTURE) ×1 IMPLANT
SUT VICRYL 4-0 PS2 18IN ABS (SUTURE) ×2 IMPLANT
UNDERPAD 30X30 INCONTINENT (UNDERPADS AND DIAPERS) ×2 IMPLANT
WATER STERILE IRR 1000ML POUR (IV SOLUTION) ×2 IMPLANT

## 2015-01-04 ENCOUNTER — Encounter (HOSPITAL_COMMUNITY): Payer: Self-pay | Admitting: Vascular Surgery

## 2015-01-04 ENCOUNTER — Telehealth: Payer: Self-pay | Admitting: Vascular Surgery

## 2015-01-04 NOTE — Telephone Encounter (Signed)
-----   Message from Sharee Pimple, RN sent at 01/13/2015  1:08 PM EDT ----- Regarding: schedule    ----- Message -----    From: Sherren Kerns, MD    Sent: 01-13-15   8:51 AM      To: [facility] Charge Pool  Exploration left cephalic vein.  asst [place]hyne.  Will need follow up in 2 weeks to remove staples  Pt had cardiac arrest during procedure and will be admitted to critical care service.  Please place on list in brackets  Fabienne Bruns

## 2015-01-04 NOTE — Telephone Encounter (Signed)
Patient is now deceased

## 2015-01-05 MED FILL — Medication: Qty: 1 | Status: AC

## 2015-01-06 ENCOUNTER — Telehealth: Payer: Self-pay

## 2015-01-06 NOTE — Telephone Encounter (Signed)
On 01/22/15 I received a death certificate from [facility]. The death certificate is for burial. The patient is a patient of Doctor Sood. The death certificate will be taken to the pulmonary unit at [facility] for signature this pm. On 01-22-15 I received the death certificate back from Doctor Widener. I got the death certificate ready for pickup and called the funeral home to let them know the death certificate is ready for pickup.

## 2015-01-18 NOTE — Anesthesia Preprocedure Evaluation (Signed)
Anesthesia Evaluation  Patient identified by MRN, date of birth, ID band Patient awake    Airway Mallampati: II  TM Distance: >3 FB     Dental   Pulmonary former smoker,    Pulmonary exam normal        Cardiovascular hypertension, +CHF  + dysrhythmias  Rhythm:Regular Rate:Normal     Neuro/Psych    GI/Hepatic   Endo/Other  diabetes, Type 2  Renal/GU Dialysis and ESRFRenal disease     Musculoskeletal  (+) Arthritis ,   Abdominal   Peds  Hematology  (+) anemia ,   Anesthesia Other Findings   Reproductive/Obstetrics                             Anesthesia Physical Anesthesia Plan  ASA: III  Anesthesia Plan: General   Post-op Pain Management:    Induction: Intravenous  Airway Management Planned: LMA and Oral ETT  Additional Equipment:   Intra-op Plan:   Post-operative Plan: Extubation in OR  Informed Consent: I have reviewed the patients History and Physical, chart, labs and discussed the procedure including the risks, benefits and alternatives for the proposed anesthesia with the patient or authorized representative who has indicated his/her understanding and acceptance.     Plan Discussed with: CRNA, Anesthesiologist and Surgeon  Anesthesia Plan Comments:         Anesthesia Quick Evaluation

## 2015-01-18 NOTE — Procedures (Signed)
Arterial Catheter Insertion Procedure Note [name] [id number] [birth year]  Procedure: Insertion of Arterial Catheter  Indications: Blood pressure monitoring and Frequent blood sampling  Procedure Details Consent: Unable to obtain consent because of emergent medical necessity. Time Out: Verified patient identification, verified procedure, site/side was marked, verified correct patient position, special equipment/implants available, medications/allergies/relevent history reviewed, required imaging and test results available.  Performed  Maximum sterile technique was used including antiseptics, cap, gloves, gown, hand hygiene, mask and sheet. Skin prep: Chlorhexidine; local anesthetic administered 20 gauge catheter was inserted into right radial artery using the Seldinger technique.  Evaluation Blood flow good; BP tracing good. Complications: No apparent complications.   Nelda Bucks 08-Jan-2015

## 2015-01-18 NOTE — H&P (Signed)
HISTORY AND PHYSICAL     CC:  In need of permanent HD access Referring Provider:  [name], MD  HPI: This is a 64 y.o. 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 Joelyn Oms.  She is not yet on HD and does not know when she will be on HD.  She states that she does get some swelling in her legs during the day.  This is improved in the mornings when she wakes.  She does have a hx of CHF.    She states that she does have some pain in her legs when walking.  This occurs when she gets up after sitting for a while and it does improve as she walks more.   She does take a beta blocker for her hypertension.  She is on a daily baby aspirin.   She does take a statin for cholesterol management.  She is on Amaryl for her diabetes.      Past Medical History   Diagnosis  Date   .  Hypertension     .  Diabetes mellitus     .  Anemia     .  Hyperlipidemia     .  Left ventricular hypertrophy     .  Cataract         Left eye   .  Glaucoma     .  Chronic combined systolic and diastolic CHF (congestive heart failure)  11/11/2014   .  Edema extremities  11/11/2014       Past Surgical History   Procedure  Laterality  Date   .  Cholecystectomy           Allergies   Allergen  Reactions   .  Nitrofurantoin Monohyd Macro  Nausea Only       Current Outpatient Prescriptions   Medication  Sig  Dispense  Refill   .  aspirin EC 81 MG tablet  Take 81 mg by mouth every morning.       .  brimonidine-timolol (COMBIGAN) 0.2-0.5 % ophthalmic solution  Place 1 drop into both eyes 3 (three) times daily.  1 Bottle  5   .  carvedilol (COREG) 12.5 MG tablet  Take 1 tablet (12.5 mg total) by mouth 2 (two) times daily with a meal.  60 tablet  0   .  ferrous sulfate 325 (65 FE) MG tablet  Take 1 tablet (325 mg total) by mouth 2 (two) times daily with a meal.  60 tablet  3   .  furosemide (LASIX) 80 MG tablet  Take 1 tablet (80 mg  total) by mouth 2 (two) times daily.  60 tablet  0   .  glimepiride (AMARYL) 1 MG tablet  Take 1 tablet (1 mg total) by mouth daily before breakfast.  30 tablet  2   .  glucose monitoring kit (FREESTYLE) monitoring kit  1 each by Does not apply route 4 (four) times daily - after meals and at bedtime. 1 month Diabetic Testing Supplies for QAC-QHS accuchecks.  1 each  1   .  hydrALAZINE (APRESOLINE) 100 MG tablet  Take 1 tablet (100 mg total) by mouth every 8 (eight) hours.  90 tablet  0   .  isosorbide mononitrate (IMDUR) 60 MG 24 hr tablet  Take 1 tablet (60 mg total) by mouth daily.  30 tablet  0   .  metolazone (ZAROXOLYN)  2.5 MG tablet  Take one tablet by mouth 30 minutes prior to your morning dose of Furosemide daily as needed for swelling  5 tablet  0   .  pravastatin (PRAVACHOL) 40 MG tablet  Take 1 tablet (40 mg total) by mouth daily.  30 tablet  7      No current facility-administered medications for this visit.       Family History   Problem  Relation  Age of Onset   .  Diabetes type II       .  Hypertension       .  Diabetes  Mother         Social History      Social History   .  Marital Status:  Married       Spouse Name:  N/A   .  Number of Children:  N/A   .  Years of Education:  N/A      Occupational History   .  Not on file.      Social History Main Topics   .  Smoking status:  Former Research scientist (life sciences)   .  Smokeless tobacco:  Never Used   .  Alcohol Use:  No   .  Drug Use:  No   .  Sexual Activity:  Not on file      Other Topics  Concern   .  Not on file      Social History Narrative      ROS: [x]  Positive   [ ]  Negative   [ ]  All sytems reviewed and are negative  Cardiovascular: []  chest pain/pressure []  palpitations []  SOB lying flat []  DOE [x]  pain in legs while walking-see HPI []  pain in feet when lying flat []  hx of DVT []  hx of phlebitis [x]  swelling in legs []  varicose veins  Pulmonary: []  productive cough []  asthma []   wheezing  Neurologic: []  weakness in []  arms []  legs []  numbness in []  arms []  legs [] difficulty speaking or slurred speech []  temporary loss of vision in one eye []  dizziness  Hematologic: []  bleeding problems []  problems with blood clotting easily  GI []  vomiting blood []  blood in stool  GU: []  burning with urination []  blood in urine  Psychiatric: []  hx of major depression  Integumentary: []  rashes []  ulcers  Constitutional: []  fever []  chills   PHYSICAL EXAMINATION:    Filed Vitals:   Jan 15, 2015 0609  BP: 162/99  Pulse: 73  Temp: 97.1 F (36.2 C)  TempSrc: Oral  Resp: 20  Height: 5' 1"  (1.549 m)  Weight: 127 lb (57.607 kg)  SpO2: 100%    General:  WDWN in NAD Gait: Not observed HENT: WNL, normocephalic Pulmonary: normal non-labored breathing , without Rales, rhonchi,  wheezing Cardiac: RRR, without  Murmurs, rubs or gallops; without carotid bruits Abdomen: soft, NT, no masses Skin: without rashes, without ulcers  Vascular Exam/Pulses:   Right  Left   Radial  2+ (normal)  2+ (normal)   Ulnar  Unable to palpate    1+ (weak)   DP  2+ (normal)  1+ (weak)   PT  Unable to palpate    Unable to palpate      Extremities: without ischemic changes, without Gangrene , without cellulitis; without open wounds; +BLE edema Musculoskeletal: no muscle wasting or atrophy       Neurologic: A&O X 3; Appropriate Affect ; SENSATION: normal; MOTOR FUNCTION:  moving all extremities equally. Speech is fluent/normal  Non-Invasive Vascular Imaging:   Bilateral upper extremity vein mapping duplex 7/33/44: Right cephalic vein:  8.30JF-9.96QX Right basilic vein:  5.70YI-0.26CN  Left cephalic vein:  1.67JU-1.25OK Left basilic vein:  3.23GK-8.87LZ  Bilateral upper extremity arterial duplex 12/02/14: Right:  0.22cm-0.47cm  Triphasic Left:  0.29cm-0.44cm  Triphasic  Pt meds includes: Statin:  Yes.   Beta Blocker:  Yes.   Aspirin: Yes.   ACEI: No. ARB:  No. Other  Antiplatelet/Anticoagulant:  No.    ASSESSMENT/PLAN:: 64 y.o. female with CKD 5 in need of permanent HD access   -the pt is right hand dominant -she does appear to have adequate vein for a left radial cephalic AVF -will plan on creation of fistula on 12/07/14 -she does have hx of CHF-she appears to be doing quite well today.  She does have f/u appt with Dr. Rhea Belton, PA-C [facility] [phone]  Clinic MD:  Pt seen and examined in conjunction with Dr. Oneida Alar  History and exam details as above. Patient seems to be a good candidate for left radiocephalic AV fistula. Risks benefits possible, the patient and procedure details were to find the patient and her husband today.  Ruta Hinds, MD [facility] Office: [phone] Pager: [phone]

## 2015-01-18 NOTE — Anesthesia Postprocedure Evaluation (Signed)
  Anesthesia Post-op Note  Patient: [name]  Procedure(s) Performed: Procedure(s): ATTEMPTED RADIOCEPHALIC ARTERIOVENOUS (AV) FISTULA CREATION (Left)  Patient Location: SICU  Anesthesia Type:General  Level of Consciousness: lethargic, responds to stimulation, obtunded and Patient remains intubated per anesthesia plan  Airway and Oxygen Therapy: Patient remains intubated per anesthesia plan and Patient placed on Ventilator (see vital sign flow sheet for setting)  Post-op Pain: none  Post-op Assessment: PATIENT'S CARDIOVASCULAR STATUS UNSTABLE, RESPIRATORY FUNCTION UNSTABLE and Pain level controlled LLE Motor Response: Non-purposeful movement   RLE Motor Response: Non-purposeful movement        Post-op Vital Signs:   Last Vitals:    Complications: respiratory complications and cardiovascular complications

## 2015-01-18 NOTE — Progress Notes (Signed)
Patient Time of Death 1854 per MD Craige Cotta. [facility] notified; Patient suitable for tissues only. Medical Examiner [place] notified; Patient considered a medical case, not a ME case per Chari Manning. Patient's spouse and daughter notified. Family to call bed placement with funeral home information. No belongings at bedside. Post-mortem checklist complete.

## 2015-01-18 NOTE — Procedures (Signed)
Central Venous Catheter Insertion Procedure Note [name] [id number] June 02, 1950  Procedure: Insertion of Central Venous Catheter Indications: Assessment of intravascular volume, Drug and/or fluid administration and Frequent blood sampling  Procedure Details Consent: Unable to obtain consent because of emergent medical necessity. Time Out: Verified patient identification, verified procedure, site/side was marked, verified correct patient position, special equipment/implants available, medications/allergies/relevent history reviewed, required imaging and test results available.  Performed  Maximum sterile technique was used including antiseptics, cap, gloves, gown, hand hygiene, mask and sheet. Skin prep: Chlorhexidine; local anesthetic administered A antimicrobial bonded/coated triple lumen catheter was placed in the right internal jugular vein using the Seldinger technique.  Evaluation Blood flow good Complications: No apparent complications Patient did tolerate procedure well. Chest X-ray ordered to verify placement.  CXR: pending.  Nelda Bucks 01-23-2015, 9:42 AM  [place] Mcarthur Rossetti. Tyson Alias, MD, FACP Pgr: [phone] [facility]

## 2015-01-18 NOTE — Progress Notes (Addendum)
Patients blood pressure on arterial line dropped suddenly from 110's to 80's. This RN went in room, blood pressure continued to fall rapidly to 40's systolic. Neo was started on patient as well as levophed. Patient lost pulses, code blue was called. Dr. Craige Cotta on floor assisting with code. ROSC was achieved, however patient again lost pulses and ultimately passed away after family made her a DNR. Family was called during code and patient was made a DNR. Family at bedside after ROSC and able to spend last moments with the patient. Chaplain was paged and prayed with the family. Time of death confirmed with Ree Edman RN to be 18:54. Patients pupils fixed and dilated, no heart sounds or breath sounds heard. Will continue to provide support for family.   [name] R

## 2015-01-18 NOTE — Progress Notes (Signed)
  Echocardiogram 2D Echocardiogram has been performed.  Leta Jungling M 18-Jan-2015, 11:50 AM

## 2015-01-18 NOTE — Consult Note (Addendum)
PULMONARY / CRITICAL CARE MEDICINE   Name: [name] MRN: [id number] DOB: 06/09/1950    ADMISSION DATE:  12/18/2014 CONSULTATION DATE:  10/17  REFERRING MD :  Fields   CHIEF COMPLAINT:  Post arrest   INITIAL PRESENTATION: 64yo female with multiple medical issues including HTN, DM, severe pulm HTN, combined CHF, CKD presented 10/17 for placement fistula in anticipation HD.  In OR after induction of anesthesia she had progressive hypotension and bradycardia and eventual cardiac arrest with ROSC initially after 2 mins CPR, but lost pulse again and had several brief rounds of ACLS with reported total CPR <5 mins  PCCM consulted for ICU admission.   STUDIES:  2D echo 10/17>>>  SIGNIFICANT EVENTS:    HISTORY OF PRESENT ILLNESS:  64yo female with multiple medical issues including HTN, DM, severe pulm HTN, combined CHF, CKD presented 10/17 for placement fistula in anticipation HD.  In OR after induction of anesthesia she had progressive hypotension and bradycardia and eventual cardiac arrest with ROSC initially after 2 mins CPR, but lost pulse again and had several rounds of ACLS.  PCCM consulted for ICU admission.   Per family has had progressive back pain over last 2 weeks with intermittent diarrhea and incontinence of stool.  Recent UTI.  No reports of chest pain, dyspnea, syncope, fever.    PAST MEDICAL HISTORY :   has a past medical history of Hypertension; Diabetes mellitus; Anemia; Hyperlipidemia; Left ventricular hypertrophy; Cataract; Glaucoma; Chronic combined systolic and diastolic CHF (congestive heart failure) (HCC) (11/11/2014); Edema extremities (11/11/2014); Chronic kidney disease; and Arthritis.  has past surgical history that includes Cholecystectomy. Prior to Admission medications   Medication Sig Start Date End Date Taking? Authorizing Provider  amoxicillin (AMOXIL) 500 MG tablet Take 1 tablet (500 mg total) by mouth 2 (two) times daily. 12/29/14  Yes Kristi M Smith,  MD  aspirin EC 81 MG tablet Take 81 mg by mouth every morning.   Yes Historical Provider, MD  brimonidine-timolol (COMBIGAN) 0.2-0.5 % ophthalmic solution Place 1 drop into both eyes 3 (three) times daily. 09/02/13  Yes Iskra M Myers, MD  carvedilol (COREG) 12.5 MG tablet Take 1.5 tablets (18.75 mg total) by mouth 2 (two) times daily with a meal. 12/23/14  Yes Dalton S McLean, MD  ferrous sulfate 325 (65 FE) MG tablet Take 1 tablet (325 mg total) by mouth 2 (two) times daily with a meal. 04/30/14  Yes Deepak Advani, MD  glimepiride (AMARYL) 1 MG tablet Take 1 tablet (1 mg total) by mouth daily before breakfast. 12/24/14  Yes Josalyn Funches, MD  glucose monitoring kit (FREESTYLE) monitoring kit 1 each by Does not apply route 4 (four) times daily - after meals and at bedtime. 1 month Diabetic Testing Supplies for QAC-QHS accuchecks. 04/30/14  Yes Deepak Advani, MD  hydrALAZINE (APRESOLINE) 100 MG tablet Take 1 tablet (100 mg total) by mouth every 8 (eight) hours. 10/18/14  Yes Traci R Turner, MD  isosorbide mononitrate (IMDUR) 60 MG 24 hr tablet Take 1.5 tablets (90 mg total) by mouth daily. 12/14/14  Yes Dalton S McLean, MD  metolazone (ZAROXOLYN) 2.5 MG tablet Take 1 tablet (2.5 mg total) by mouth daily. 12/14/14  Yes Dalton S McLean, MD  pravastatin (PRAVACHOL) 40 MG tablet Take 1 tablet (40 mg total) by mouth daily. 02/15/14  Yes Deepak Advani, MD  torsemide (DEMADEX) 20 MG tablet Take 5 tablets (100 mg total) by mouth daily. 12/23/14  Yes Dalton S McLean, MD  traMADol (ULTRAM) 50 MG   tablet Take 1 tablet (50 mg total) by mouth every 12 (twelve) hours as needed. 12/25/14  Yes Kristi M Smith, MD   Allergies  Allergen Reactions  . Nitrofurantoin Monohyd Macro Nausea Only    FAMILY HISTORY:  indicated that her mother is deceased. She indicated that her father is deceased. She indicated that her brother is deceased. She indicated that her maternal grandmother is deceased. She indicated that her paternal  grandfather is deceased.  SOCIAL HISTORY:  reports that she quit smoking about 30 years ago. She has never used smokeless tobacco. She reports that she does not drink alcohol or use illicit drugs.  REVIEW OF SYSTEMS:  Unable.  As per HPI obtained from records   SUBJECTIVE:   VITAL SIGNS: Temp:  [97.1 F (36.2 C)] 97.1 F (36.2 C) (10/17 0609) Pulse Rate:  [73-96] 96 (10/17 0936) Resp:  [20-26] 26 (10/17 0936) BP: (162-167)/(99-103) 167/103 mmHg (10/17 0936) SpO2:  [100 %] 100 % (10/17 0936) FiO2 (%):  [100 %] 100 % (10/17 0936) Weight:  [127 lb (57.607 kg)] 127 lb (57.607 kg) (10/17 0609) HEMODYNAMICS:   VENTILATOR SETTINGS: Vent Mode:  [-] PRVC FiO2 (%):  [100 %] 100 % Set Rate:  [22 bmp-26 bmp] 26 bmp Vt Set:  [380 mL] 380 mL PEEP:  [5 cmH20-10 cmH20] 10 cmH20 INTAKE / OUTPUT:  Intake/Output Summary (Last 24 hours) at 01/11/2015 1020 Last data filed at 01/04/2015 0915  Gross per 24 hour  Intake   1600 ml  Output      0 ml  Net   1600 ml    PHYSICAL EXAMINATION: General:  Thin, chronically ill appearing female, acutely ill post arrest  Neuro:  Sedated post op, pupils sluggish, minimal response  HEENT:  Mm moist, no JVD  Cardiovascular:  s1s2 rrr Lungs:  resps even non labored on vent, few scattered crackles otherwise clear  Abdomen:  Soft, hypoactive bs  Musculoskeletal:  Cool, dry, scant BLE edema   LABS:  CBC  Recent Labs Lab 01/15/2015 0633  HGB 12.2  HCT 36.0   Coag's No results for input(s): APTT, INR in the last 168 hours. BMET  Recent Labs Lab 12/19/2014 0633  NA 132*  K 3.2*  GLUCOSE 116*   Electrolytes No results for input(s): CALCIUM, MG, PHOS in the last 168 hours. Sepsis Markers No results for input(s): LATICACIDVEN, PROCALCITON, O2SATVEN in the last 168 hours. ABG No results for input(s): PHART, PCO2ART, PO2ART in the last 168 hours. Liver Enzymes No results for input(s): AST, ALT, ALKPHOS, BILITOT, ALBUMIN in the last 168  hours. Cardiac Enzymes No results for input(s): TROPONINI, PROBNP in the last 168 hours. Glucose  Recent Labs Lab 12/23/2014 0611  GLUCAP 118*    Imaging No results found.   ASSESSMENT / PLAN:  PULMONARY OETT 10/17>>> Acute respiratory failure - post cardiac arrest  P:   Vent support - 8cc/kg  F/u CXR  F/u ABG   CARDIOVASCULAR CVL R IJ 10/17>>> Cardiac arrest -- likely r/t anesthesia in setting relative hypovolemia (diarrhea) and severe pulm HTN.   Severe pulmonary HTN - dilated RA on quick bedside echo, ?effusion  HTN  Moderate MR P:  Stat echo  Trend troponin  Lactate  Needs preload  Wean epi gtt to off  Hold home coreg, imdur, hydralazine Trend CVP    RENAL CKD  P:   Stat chem Mg, phos  Replete K PRN  Hold home torsemide   GASTROINTESTINAL Diarrhea  P:   R/o CDiff    NPO  pepcid   HEMATOLOGIC Awaiting labs  P:  Stat CBC, coags  SQ heparin if cbc ok   INFECTIOUS Previous UTI  Diarrhea  P:   BCx2 10/17>>> UC 10/17>>> CDiff 10/17>>>  Ceftriaxone 10/17>>>   ENDOCRINE DM  P:   SSI   NEUROLOGIC AMS - post arrest.  2 very brief episodes with <5 mins CPR total  P:   RASS goal: -1 No hypothermia protocol  PRN fentanyl   ORTHO: Progressive back pain +/- stool incontinence  P:  Will need xray/MRI when more stable   FAMILY  - Updates:  Husband updated 10/17  - Inter-disciplinary family meet or Palliative Care meeting due by:  10/24    Nickolas Madrid, NP 2015-01-15  10:20 AM Pager: [phone] or [phone]   STAFF NOTE: Linwood Dibbles, MD FACP have personally reviewed patient's available data, including medical history, events of note, physical examination and test results as part of my evaluation. I have discussed with resident/NP and other care providers such as pharmacist, RN and RRT. In addition, I personally evaluated patient and elicited key findings of: unresponsive post arrest total 4-5 min , pea, coarse  crackles lungs, epi at 10 upon arrival, concern inaccurate Bp cuff, abdomen soft, wound clean left, concern pcxr pulm edema, likely she has CHF, diast, PA severe HTN then diarhea recent = hypovolemia then induction drop to preload = arrest, r/o cardiac ischemia, r/o cardiogenic shock, once aline placed, BP was better, we are giving fluids with concern preload dependence will assess cvp trend  In setting pa pressures 87 last echo, stat pcxr for ett line, stat echo limited done, loculated portion effusion?, await official read, get ecg, trop, attempt to dc epi drip likely able, if unable would use dopamine if remained brady, get cortisol if remains in shock, i updated family in full, they seemed unaware of how horrible her hart fxn is and PA pressures, ABg noted, MV increased to compensate, repeat abg needed, down time is less 5 min , no TTM planned, avoid fevers The patient is critically ill with multiple organ systems failure and requires high complexity decision making for assessment and support, frequent evaluation and titration of therapies, application of advanced monitoring technologies and extensive interpretation of multiple databases.   Critical Care Time devoted to patient care services described in this note is 90 Minutes. This time reflects time of care of this signee: Merrie Roof, MD FACP. This critical care time does not reflect procedure time, or teaching time or supervisory time of PA/NP/Med student/Med Resident etc but could involve care discussion time. Rest per NP/medical resident whose note is outlined above and that I agree with   Lavon Paganini. Titus Mould, MD, [place] Pgr: [facility] 01-15-15 12:25 PM

## 2015-01-18 NOTE — Progress Notes (Signed)
Chaplain responded to 5:51 pm Code Blue page for pt. No family present but I was told that daughter had been called and was en route to hospital. Pt survived the Code and I waited until daughter arrived. Daughter was tearful as Dr. Craige Cotta explained that the prognosis was not good. Chaplain provided emotional and spiritual support for pt's daughter, had prayer with her, and walked with her as she left 14M. Pt's daughter expressed appreciation for chaplain support.

## 2015-01-18 NOTE — Progress Notes (Signed)
EEG Completed; Results Pending  

## 2015-01-18 NOTE — Code Documentation (Signed)
PCCM CODE DOCUMENTATION  Patient developed PEA cardiac arrest.  CPR started.  She was given epinephrine, atropine, HCO3 x 1 each.  She had ROSC after about 5 minutes.  She was started on levophed and phenylephrine.  She was having myoclonic jerking prior to this latest episode of cardiac arrest.  She developed significant hemoptysis after resuscitation.    I spoke with pt's husband and daughter over the phone.  They are both in agreement, that she has been through a lot with her medical problems and would not want to her to suffer anymore w/o any realistic hope for recovery.  They have agreed to DNR status.  Will continue current therapies w/o escalation of care.  I have informed them that she will likely not survive this hospital stay.  Coralyn Helling, MD [facility] 2015-01-15, 6:14 PM Pager:  [phone] After 3pm call: [phone]

## 2015-01-18 NOTE — Progress Notes (Signed)
CRITICAL VALUE ALERT  Critical value received:  Troponin 0.84  Date of notification:  01-08-15  Time of notification:  17:05  Critical value read back:Yes.    Nurse who received alert:  Wyn Quaker RN  MD notified (1st page):  Dr. Dellie Catholic  Time of first page:  17:14  Responding MD:  Dr. Dellie Catholic  Time MD responded:  17:15

## 2015-01-18 NOTE — Progress Notes (Signed)
 fentanyl gtt wasted in sink. Witnessed by Remonia Richter.

## 2015-01-18 NOTE — Procedures (Signed)
STAT limited echo  1. LV fxn poor, 10-15% 2. Small pericardial effusion except concern on loculated portion on rt heart 3. RV dilated but kinetic, moderate redcution fxn 4. LVH   Mcarthur Rossetti. Tyson Alias, MD, FACP Pgr: [phone] [facility]'

## 2015-01-18 NOTE — Consult Note (Addendum)
NEURO HOSPITALIST CONSULT NOTE   Referring physician: Titus Mould   Reason for Consult: Anoxic brain injury  HPI:                                                                                                                                          [name] is an 64 y.o. female with multiple medical issues including HTN, DM, severe pulm HTN, combined CHF, CKD presented 10/17 for placement fistula in anticipation HD. In OR after induction of anesthesia she had progressive hypotension and bradycardia and eventual cardiac arrest with ROSC initially after 2 mins CPR 0828, but lost pulse again and had several brief rounds of ACLS with reported total CPR <5 mins.  Currently CT head and EEG pending.  She has had some intermittent bilateral myoclonic twitching but non on my assessment. Currently breathing over the vent.   Currently PCCM is evaluating for UTI, C diff,    STAT limited echo today: 1. LV fxn poor, 10-15% 2. Small pericardial effusion except concern on loculated portion on rt heart 3. RV dilated but kinetic, moderate redcution fxn 4. LVH  Currently  Na 134 K 2.8 Cr 3.78 AST 257 ALT 146 Lactic acid 2.1  Past Medical History  Diagnosis Date  . Hypertension   . Diabetes mellitus   . Anemia   . Hyperlipidemia   . Left ventricular hypertrophy   . Cataract     Left eye  . Glaucoma   . Chronic combined systolic and diastolic CHF (congestive heart failure) ([place]) 11/11/2014  . Edema extremities 11/11/2014  . Chronic kidney disease   . Arthritis     Past Surgical History  Procedure Laterality Date  . Cholecystectomy      Family History  Problem Relation Age of Onset  . Diabetes type II    . Hypertension    . Diabetes Mother      Social History:  reports that she quit smoking about 30 years ago. She has never used smokeless tobacco. She reports that she does not drink alcohol or use illicit drugs.  Allergies  Allergen Reactions  .  Nitrofurantoin Monohyd Macro Nausea Only    MEDICATIONS:  Prior to Admission:  Prescriptions prior to admission  Medication Sig Dispense Refill Last Dose  . amoxicillin (AMOXIL) 500 MG tablet Take 1 tablet (500 mg total) by mouth 2 (two) times daily. 14 tablet 0 01/02/2015 at Unknown time  . aspirin EC 81 MG tablet Take 81 mg by mouth every morning.   01/02/2015 at Unknown time  . brimonidine-timolol (COMBIGAN) 0.2-0.5 % ophthalmic solution Place 1 drop into both eyes 3 (three) times daily. 1 Bottle 5 Past Week at Unknown time  . carvedilol (COREG) 12.5 MG tablet Take 1.5 tablets (18.75 mg total) by mouth 2 (two) times daily with a meal. 90 tablet 3 2015/01/04 at 0415  . ferrous sulfate 325 (65 FE) MG tablet Take 1 tablet (325 mg total) by mouth 2 (two) times daily with a meal. 60 tablet 3 Past Week at Unknown time  . glimepiride (AMARYL) 1 MG tablet Take 1 tablet (1 mg total) by mouth daily before breakfast. 30 tablet 0 01/01/2015  . glucose monitoring kit (FREESTYLE) monitoring kit 1 each by Does not apply route 4 (four) times daily - after meals and at bedtime. 1 month Diabetic Testing Supplies for QAC-QHS accuchecks. 1 each 1 01/02/2015 at Unknown time  . hydrALAZINE (APRESOLINE) 100 MG tablet Take 1 tablet (100 mg total) by mouth every 8 (eight) hours. 90 tablet 0 2015/01/04 at 0415  . isosorbide mononitrate (IMDUR) 60 MG 24 hr tablet Take 1.5 tablets (90 mg total) by mouth daily. 45 tablet 3 2015-01-04 at 0415  . metolazone (ZAROXOLYN) 2.5 MG tablet Take 1 tablet (2.5 mg total) by mouth daily. 30 tablet 3 01/02/2015 at Unknown time  . pravastatin (PRAVACHOL) 40 MG tablet Take 1 tablet (40 mg total) by mouth daily. 30 tablet 7 Past Month at Unknown time  . torsemide (DEMADEX) 20 MG tablet Take 5 tablets (100 mg total) by mouth daily. 150 tablet 3 01/02/2015 at Unknown time  .  traMADol (ULTRAM) 50 MG tablet Take 1 tablet (50 mg total) by mouth every 12 (twelve) hours as needed. 30 tablet 0 Past Week at Unknown time   Scheduled: . antiseptic oral rinse  7 mL Mouth Rinse q12n4p  . [START ON 01/04/2015] aspirin EC  81 mg Oral q morning - 10a  . brimonidine  1 drop Both Eyes TID   And  . timolol  1 drop Both Eyes TID  . cefTRIAXone (ROCEPHIN)  IV  1 g Intravenous Q24H  . chlorhexidine  15 mL Mouth Rinse BID  . famotidine (PEPCID) IV  20 mg Intravenous Q24H  . heparin  5,000 Units Subcutaneous 3 times per day  . insulin aspart  0-9 Units Subcutaneous 6 times per day  . potassium chloride  10 mEq Intravenous Q1 Hr x 4     ROS:  History obtained from unobtainable from patient due to mental status    Blood pressure 164/101, pulse 71, temperature 95.1 F (35.1 C), temperature source Rectal, resp. rate 26, height 5' 1" (1.549 m), weight 57.607 kg (127 lb), SpO2 100 %.   Neurologic Examination:                                                                                                      HEENT-  Normocephalic, no lesions, without obvious abnormality.  Normal external eye and conjunctiva.  Normal TM's bilaterally.  Normal auditory canals and external ears. Normal external nose, mucus membranes and septum.  Normal pharynx. Cardiovascular- S1, S2 normal, pulses palpable throughout   Lungs- chest clear, no wheezing, rales, normal symmetric air entry Abdomen- normal findings: bowel sounds normal Extremities- no edema Lymph-no adenopathy palpable Musculoskeletal-no joint tenderness, deformity or swelling Skin-warm and dry, no hyperpigmentation, vitiligo, or suspicious lesions  Neurological Examination Mental Status: Patient does not respond to verbal stimuli.  Does not respond to deep sternal rub.  Does not follow commands.  No  verbalizations noted.  Cranial Nerves: II: patient does not respond confrontation bilaterally, pupils right 3 mm, left 4 mm,and non reactive bilaterally III,IV,VI: doll's response present bilaterally.  V,VII: corneal reflex absent bilaterally  VIII: patient does not respond to verbal stimuli IX,X: gag reflex absent, XI: trapezius strength unable to test bilaterally XII: tongue strength unable to test Motor: Extremities flaccid throughout.  No spontaneous movement noted.  No purposeful movements noted. Sensory: Does not respond to noxious stimuli in any extremity. Deep Tendon Reflexes:  Absent throughout. Plantars: absent bilaterally Cerebellar: Unable to perform        Lab Results: Basic Metabolic Panel:  Recent Labs Lab 01-07-15 0633 01-07-15 1034  NA 132* 134*  K 3.2* 2.8*  CL  --  102  CO2  --  21*  GLUCOSE 116* 178*  BUN  --  83*  CREATININE  --  3.78*  CALCIUM  --  8.7*  MG  --  2.2  PHOS  --  6.0*    Liver Function Tests:  Recent Labs Lab 01-07-15 1034  AST 257*  ALT 146*  ALKPHOS 146*  BILITOT 0.9  PROT 4.9*  ALBUMIN 2.6*   No results for input(s): LIPASE, AMYLASE in the last 168 hours. No results for input(s): AMMONIA in the last 168 hours.  CBC:  Recent Labs Lab 01-07-15 0633 2015-01-07 1034  WBC  --  6.5  HGB 12.2 9.2*  HCT 36.0 28.0*  MCV  --  79.8  PLT  --  161    Cardiac Enzymes:  Recent Labs Lab January 07, 2015 1034  TROPONINI 0.21*    Lipid Panel: No results for input(s): CHOL, TRIG, HDL, CHOLHDL, VLDL, LDLCALC in the last 168 hours.  CBG:  Recent Labs Lab January 07, 2015 0611 01/07/15 1035  GLUCAP 118* 162*    Microbiology: Results for orders placed or performed during the hospital encounter of 2015-01-07  MRSA PCR Screening     Status: None   Collection Time: 01/07/2015 10:36 AM  Result Value Ref  Range Status   MRSA by PCR NEGATIVE NEGATIVE Final    Comment:        The GeneXpert MRSA Assay (FDA approved for NASAL  specimens only), is one component of a comprehensive MRSA colonization surveillance program. It is not intended to diagnose MRSA infection nor to guide or monitor treatment for MRSA infections.     Coagulation Studies:  Recent Labs  2015-01-30 1034  LABPROT 17.9*  INR 1.47    Imaging: Dg Chest Port 1 View  2015/01/30  CLINICAL DATA:  Central line placement.  Shock. EXAM: PORTABLE CHEST 1 VIEW COMPARISON:  10/15/2014 FINDINGS: New right IJ central line, tip at the upper cavoatrial junction. New endotracheal tube with tip just below the clavicular heads. New diffuse bilateral airspace disease with apical predominance (possibly positional given recent line placement). No evidence of pneumothorax. Trace pleural effusions. Normal heart size for technique with negative aortic and hilar contours. IMPRESSION: 1. New endotracheal tube and central line without adverse finding. 2. Progressive pulmonary edema with small effusions. Electronically Signed   By: Monte Fantasia M.D.   On: 01-30-15 10:21       Assessment and plan per attending neurologist  Etta Quill PA-C [facility] [phone]  01-30-15, 1:23 PM   Assessment/Plan: 63 YO female with cardiac arrest <5 minutes S/P Anesthesia induction for fistula surgery. Current.y she is on no sedation breathing over the vent but lack corneal, pupillary and gag reflex. CT head and EEG pending.  Clinically she shows no seizure activity but NCSE cannot be ruled out. Extent of anoxic brain injury cannot be determined at this point.  Recommend: 1) CT head 2) EEG  I personally participated in this patient's evaluation and management, including examination, as well as formulating the above clinical assessment and management recommendations.  This patient is critically ill and at significant risk of neurological worsening or death, and care requires constant monitoring of vital signs, hemodynamics,respiratory and cardiac  monitoring, neurological assessment, discussion with family, other specialists and medical decision making of high complexity. Total critical care time was 50 minutes.  Otila Back. Nicole Kindred, MD [facility] [phone]

## 2015-01-18 NOTE — Progress Notes (Addendum)
CRITICAL VALUE ALERT  Critical value received:  Lactic acid 2.1  Date of notification: 01/27/2015  Time of notification:  11:05  Critical value read back:Yes.    Nurse who received alert:  Wyn Quaker Rn  MD notified (1st page):  Dr. Tyson Alias  Time of first page:  12:00   Responding MD:  Dr. Tyson Alias  Time MD responded:  12:05

## 2015-01-18 NOTE — Transfer of Care (Signed)
Immediate Anesthesia Transfer of Care Note  Patient: [name]  Procedure(s) Performed: Procedure(s): ATTEMPTED RADIOCEPHALIC ARTERIOVENOUS (AV) FISTULA CREATION (Left)  Patient Location: ICU  Anesthesia Type:General  Level of Consciousness: sedated and Patient remains intubated per anesthesia plan  Airway & Oxygen Therapy: Patient remains intubated per anesthesia plan and Patient placed on Ventilator (see vital sign flow sheet for setting)  Post-op Assessment: Report given to RN and Post -op Vital signs reviewed and stable  Post vital signs: Reviewed and stable  Last Vitals:  Filed Vitals:   2015/02/02 0609  BP: 162/99  Pulse: 73  Temp: 36.2 C  Resp: 20    Complications: No apparent anesthesia complications

## 2015-01-18 NOTE — Progress Notes (Signed)
eLink Physician-Brief Progress Note Patient Name: MODESTA EVEREST DOB: Dec 18, 1950 MRN: [id number]   Date of Service  January 29, 2015  HPI/Events of Note  Troponin = 0.84. Patient in renal failure and s/p cardiac arrest/CPR this AM. LVEF estimated to be 10% to 15%. Already on ASA daily. Bleeding from ETT and central line sites >> no Heparin IV infusion. SBP in 110's, severely reduced LVEF and bradycardia prior to OR >> no B-Blocker.  eICU Interventions  Continue present management.      Intervention Category Intermediate Interventions: Diagnostic test evaluation  Sommer,Steven Eugene 2015-01-29, 5:15 PM

## 2015-01-18 NOTE — Anesthesia Procedure Notes (Addendum)
Procedure Name: Intubation Date/Time: 01/23/15 8:31 AM Performed by: Wray Kearns A Pre-anesthesia Checklist: Patient identified, Timeout performed, Emergency Drugs available, Suction available and Patient being monitored Patient Re-evaluated:Patient Re-evaluated prior to inductionOxygen Delivery Method: Circle system utilized Preoxygenation: Pre-oxygenation with 100% oxygen Intubation Type: IV induction and Cricoid Pressure applied Laryngoscope Size: 4 and Mac Grade View: Grade I Tube type: Oral Tube size: 7.5 mm Number of attempts: 1 Airway Equipment and Method: Stylet Placement Confirmation: ETT inserted through vocal cords under direct vision,  breath sounds checked- equal and bilateral,  positive ETCO2 and CO2 detector Secured at: 20 cm Tube secured with: Tape Dental Injury: Teeth and Oropharynx as per pre-operative assessment     Procedure Name: LMA Insertion Date/Time: 2015-01-23 7:42 AM Performed by: Wray Kearns A Pre-anesthesia Checklist: Patient identified, Timeout performed, Emergency Drugs available, Suction available and Patient being monitored Patient Re-evaluated:Patient Re-evaluated prior to inductionOxygen Delivery Method: Circle system utilized Preoxygenation: Pre-oxygenation with 100% oxygen Intubation Type: IV induction Ventilation: Mask ventilation without difficulty LMA: LMA inserted LMA Size: 3.0 Tube type: Oral Number of attempts: 1 Placement Confirmation: ETT inserted through vocal cords under direct vision,  breath sounds checked- equal and bilateral and positive ETCO2 Tube secured with: Tape Dental Injury: Teeth and Oropharynx as per pre-operative assessment

## 2015-01-18 NOTE — Op Note (Signed)
Procedure: Exploration left cephalic vein  Preoperative diagnosis: End-stage renal disease.  Postoperative diagnosis: Same Anesthesia: Gen.  Assistant: Doreatha Massed PA-C  Operative findings: 2-1/2-3 mm left cephalic vein  Operative details: After obtaining informed consent, the patient was taken to the operating room. The patient was placed in supine position on the operating room table. After induction of general anesthesia the patient's entire left upper extremity was prepped and draped in usual sterile fashion. A longitudinal incision was made in the left wrist adjacent to the left cephalic vein. The incision was carried onto subcutaneous tissues down to the left cephalic vein. It was dissected free circumferentially. It was of reasonable quality 2-1/2-3 mm diameter. At this point the patient became progressively hypotensive and bradycardic. She was noted to have absent axillary and femoral pulse. The incision was closed with staples. ACLS protocol was initiated. The patient underwent approximately 2 minutes of CPR. She then had restoration of her pulse and a more appropriate cardiac rhythm. She also had an endotracheal tube placed. The patient was transferred to the recovery room in critical condition. The critical care service was contacted for assistance in further management.  Fabienne Bruns, MD [facility] Office: [phone] Pager: [phone]

## 2015-01-18 NOTE — Progress Notes (Signed)
Patient developed asystole on monitor.  No heart sounds.  Informed pt's family of her passing.  Time of death 76.  Coralyn Helling, MD [facility] Pulmonary/Critical Care Jan 31, 2015, 6:57 PM Pager:  [phone] After 3pm call: [phone]

## 2015-01-18 NOTE — Discharge Summary (Signed)
[name] was a 64 y.o. female admitted on 2015/01/22 for permanent dialysis access placement in setting of stage V CKD.  She was taken to OR and placed under general anesthesia.  She had incision made in Lt arm.  During procedure she became bradycardic and hypotensive.  She then developed cardiac arrest.  The procedure was aborted, and ACLS started.  She had ROSC after about 2 to 5 minutes.  She was noted to have diarrhea prior to admission, and concern for UTI.  She was started on rocephin for UTI.  She had Echo which showed EF of 15%.  She remained unresponsive.  Neurology was consulted.  There was concern for anoxic encephalopathy based on neurologic exam.  Her EEG showed severe encephalopathy that could be seen with significant anoxic brain injury.  She developed recurrent PEA cardiac arrest in the evening of 01-22-2015.  She had ROSC after 5 minutes.  She developed cardiogenic shock and started on pressors.  Grave prognosis was reviewed with pt's family.  They agreed to DNR status.  She developed bradycardia and then asystole.  She was pronounced dead at 18:54 on 01/22/15.   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

## 2015-01-18 NOTE — OR Nursing (Signed)
CPR initiated at 0828.  See anesthesia note.

## 2015-01-18 NOTE — Procedures (Signed)
EEG report.  Brief clinical history: 64 YO female with cardiac arrest <5 minutes S/P Anesthesia induction for fistula surgery. Current.y she is on no sedation breathing over the vent but lack corneal, pupillary and gag reflex.   Technique: this is a 17 channel routine scalp EEG performed at the bedside in the ICU, with bipolar and monopolar montages arranged in accordance to the international 10/20 system of electrode placement. One channel was dedicated to EKG recording.  The patient is not se dated, intubated on the vent. No activating procedures performed.  Description: the entire recording is characterized by marked background attenuation with isolated muscle jerks that are not accompany by concomitant EEG changes. Although there is evidence of frequent muscle artifacts, the overall background is virtually non disrupted by a frank cortical rhythm.   No electrographic seizures, focal or generalized epileptiform discharges noted.  EKG showed sinus rhythm.  Impression: this is a markedly abnormal EEG due to prominent background suppression with virtually non discernible cortical activity. However, it must be stressed that this was not a brain death protocol EEG. The general pattern of this EEG is consistent with a severe encephalopathy that in this particular scenario is most likely post anoxic in etiology. No electrographic seizures noted. Clinical correlation is advised.   Wyatt Portela, MD [facility]

## 2015-01-18 NOTE — Care Management Note (Signed)
Case Management Note  Patient Details  Name: [name] MRN: [id number] Date of Birth: [birth year]  Subjective/Objective:                    Action/Plan:   Expected Discharge Date:                  Expected Discharge Plan:  Home/Self Care  In-House Referral:     Discharge planning Services  CM Consult  Post Acute Care Choice:    Choice offered to:     DME Arranged:    DME Agency:     HH Arranged:    HH Agency:     Status of Service:  Completed, signed off  Medicare Important Message Given:    Date Medicare IM Given:    Medicare IM give by:    Date Additional Medicare IM Given:    Additional Medicare Important Message give by:     If discussed at Long Length of Stay Meetings, dates discussed:    Additional Comments:  Vangie Bicker, RN 01/04/2015, 9:38 AM

## 2015-01-18 DEATH — deceased

## 2015-01-27 ENCOUNTER — Encounter (HOSPITAL_COMMUNITY): Payer: Self-pay

## 2017-09-17 IMAGING — CR DG CHEST 1V PORT
1 series · 1 of 1 positions shown · non-contrast
Comparison: 01/03/2015

CLINICAL DATA: Productive cough with chest congestion

EXAM:
PORTABLE CHEST 1 VIEW

[AP]
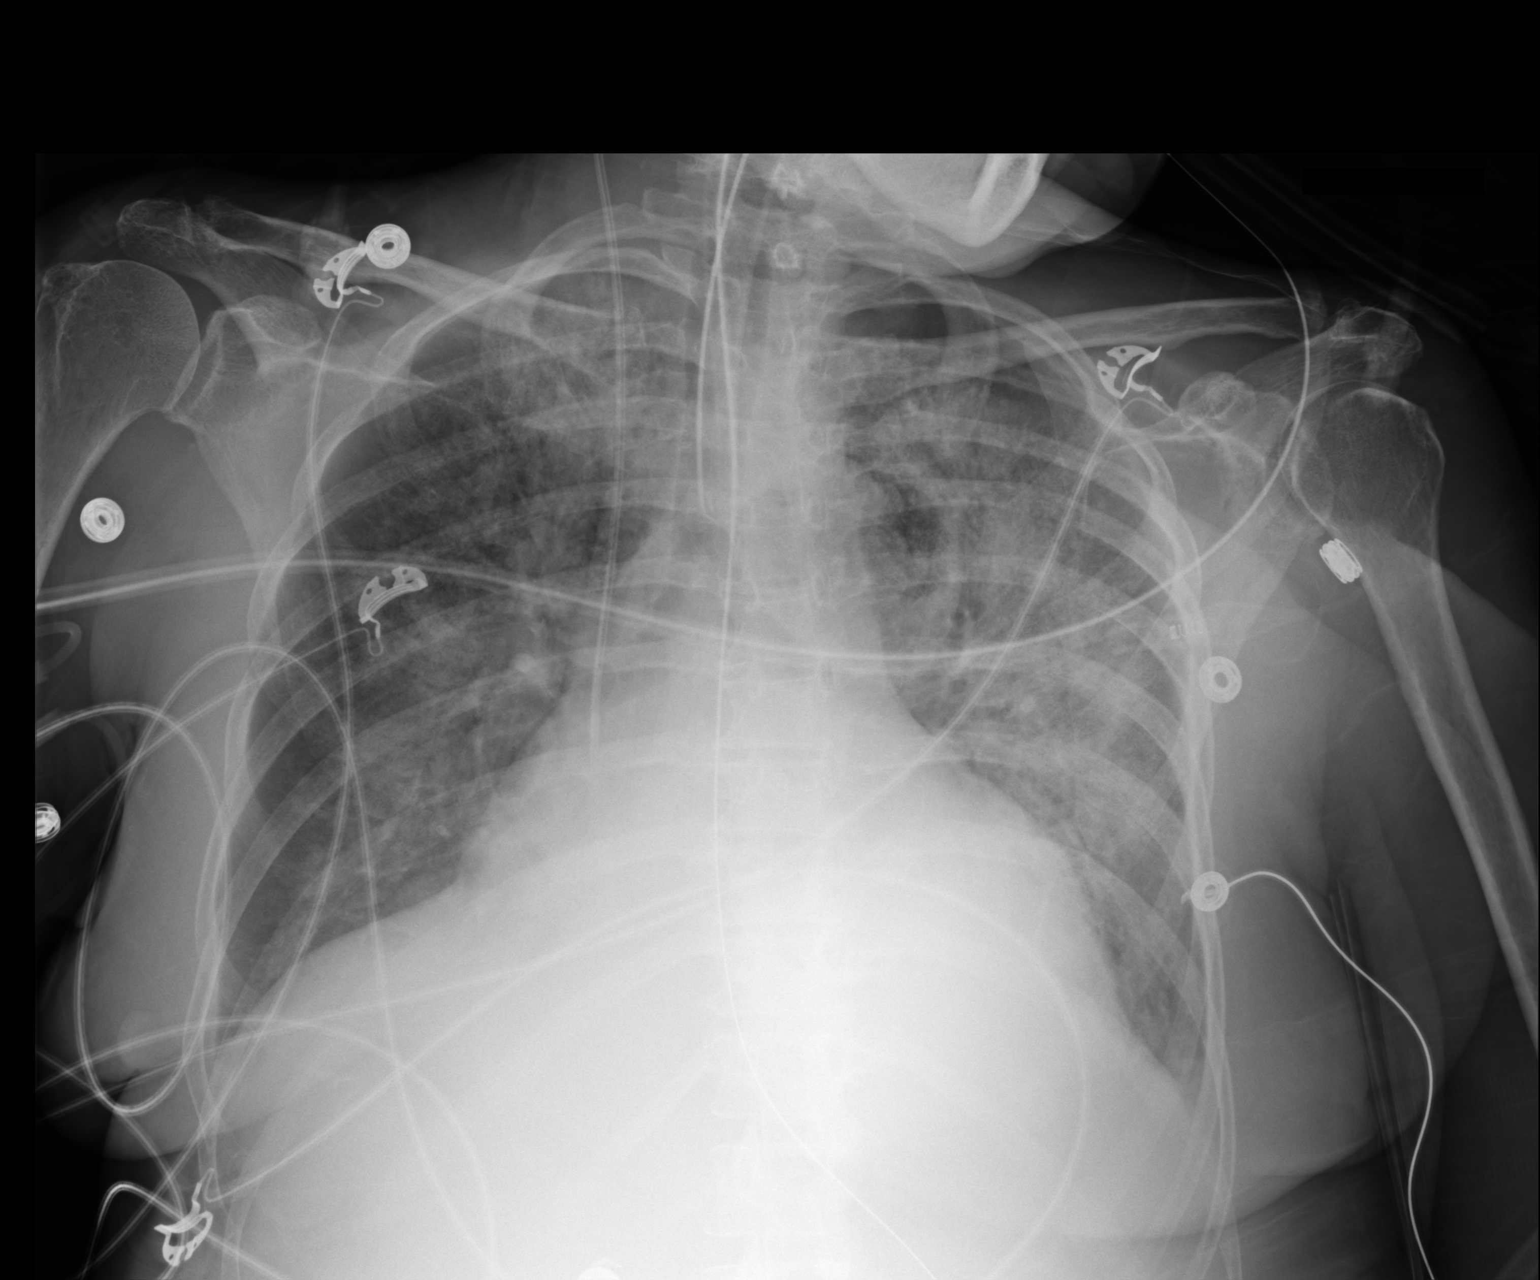

[1 of 1 positions shown; findings below may reference images not displayed]

FINDINGS: There is an endotracheal tube with the tip 1 cm above the carina.
There is a nasogastric tube coursing below the diaphragm. There is a
right jugular central venous catheter with the tip projecting over
the cavoatrial junction.

There are bilateral interstitial and alveolar airspace opacities. No
significant pleural effusion or pneumothorax. There is stable
cardiomegaly.

The osseous structures are unremarkable.
IMPRESSION: 1. There is an endotracheal tube with the tip 1 cm above the carina.

2. Cardiomegaly with bilateral interstitial and alveolar airspace
opacities. Differential diagnosis includes pulmonary edema versus
multilobar pneumonia versus ARDS.
